# Patient Record
Sex: Female | Born: 1965 | Race: Black or African American | Hispanic: No | Smoking: Never smoker
Health system: Southern US, Community
[De-identification: ages and names within clinical notes are randomized; demographics above are authoritative.]

## PROBLEM LIST (undated history)

## (undated) DIAGNOSIS — M51369 Other intervertebral disc degeneration, lumbar region without mention of lumbar back pain or lower extremity pain: Secondary | ICD-10-CM

## (undated) DIAGNOSIS — M5126 Other intervertebral disc displacement, lumbar region: Secondary | ICD-10-CM

## (undated) DIAGNOSIS — I2510 Atherosclerotic heart disease of native coronary artery without angina pectoris: Secondary | ICD-10-CM

## (undated) DIAGNOSIS — R87619 Unspecified abnormal cytological findings in specimens from cervix uteri: Secondary | ICD-10-CM

## (undated) DIAGNOSIS — M1990 Unspecified osteoarthritis, unspecified site: Secondary | ICD-10-CM

## (undated) DIAGNOSIS — I10 Essential (primary) hypertension: Secondary | ICD-10-CM

## (undated) DIAGNOSIS — Z91148 Patient's other noncompliance with medication regimen for other reason: Secondary | ICD-10-CM

## (undated) DIAGNOSIS — E785 Hyperlipidemia, unspecified: Secondary | ICD-10-CM

## (undated) DIAGNOSIS — E282 Polycystic ovarian syndrome: Secondary | ICD-10-CM

## (undated) DIAGNOSIS — M5136 Other intervertebral disc degeneration, lumbar region: Secondary | ICD-10-CM

## (undated) DIAGNOSIS — Z9114 Patient's other noncompliance with medication regimen: Secondary | ICD-10-CM

## (undated) DIAGNOSIS — N200 Calculus of kidney: Secondary | ICD-10-CM

## (undated) DIAGNOSIS — E669 Obesity, unspecified: Secondary | ICD-10-CM

## (undated) DIAGNOSIS — D219 Benign neoplasm of connective and other soft tissue, unspecified: Secondary | ICD-10-CM

## (undated) DIAGNOSIS — D649 Anemia, unspecified: Secondary | ICD-10-CM

## (undated) DIAGNOSIS — G43909 Migraine, unspecified, not intractable, without status migrainosus: Secondary | ICD-10-CM

## (undated) HISTORY — DX: Benign neoplasm of connective and other soft tissue, unspecified: D21.9

## (undated) HISTORY — DX: Polycystic ovarian syndrome: E28.2

## (undated) HISTORY — DX: Atherosclerotic heart disease of native coronary artery without angina pectoris: I25.10

## (undated) HISTORY — DX: Migraine, unspecified, not intractable, without status migrainosus: G43.909

## (undated) HISTORY — DX: Calculus of kidney: N20.0

## (undated) HISTORY — DX: Patient's other noncompliance with medication regimen for other reason: Z91.148

## (undated) HISTORY — PX: BARIATRIC SURGERY: SHX1103

## (undated) HISTORY — DX: Unspecified abnormal cytological findings in specimens from cervix uteri: R87.619

## (undated) HISTORY — PX: CORONARY ANGIOPLASTY WITH STENT PLACEMENT: SHX49

## (undated) HISTORY — PX: TUBAL LIGATION: SHX77

## (undated) HISTORY — DX: Patient's other noncompliance with medication regimen: Z91.14

## (undated) HISTORY — DX: Obesity, unspecified: E66.9

## (undated) HISTORY — DX: Essential (primary) hypertension: I10

## (undated) HISTORY — DX: Anemia, unspecified: D64.9

## (undated) HISTORY — PX: OTHER SURGICAL HISTORY: SHX169

## (undated) HISTORY — DX: Hyperlipidemia, unspecified: E78.5

---

## 1999-09-18 ENCOUNTER — Emergency Department (HOSPITAL_COMMUNITY): Admission: EM | Admit: 1999-09-18 | Discharge: 1999-09-18 | Payer: Self-pay | Admitting: Emergency Medicine

## 1999-09-18 ENCOUNTER — Encounter: Payer: Self-pay | Admitting: Emergency Medicine

## 1999-09-24 ENCOUNTER — Encounter: Admission: RE | Admit: 1999-09-24 | Discharge: 1999-09-24 | Payer: Self-pay | Admitting: *Deleted

## 1999-09-25 ENCOUNTER — Encounter: Payer: Self-pay | Admitting: *Deleted

## 1999-09-25 ENCOUNTER — Encounter: Admission: RE | Admit: 1999-09-25 | Discharge: 1999-09-25 | Payer: Self-pay | Admitting: *Deleted

## 1999-12-09 ENCOUNTER — Emergency Department (HOSPITAL_COMMUNITY): Admission: EM | Admit: 1999-12-09 | Discharge: 1999-12-09 | Payer: Self-pay | Admitting: Emergency Medicine

## 1999-12-09 ENCOUNTER — Encounter: Payer: Self-pay | Admitting: *Deleted

## 2000-06-13 ENCOUNTER — Other Ambulatory Visit: Admission: RE | Admit: 2000-06-13 | Discharge: 2000-06-13 | Payer: Self-pay | Admitting: Obstetrics

## 2000-06-14 ENCOUNTER — Encounter (INDEPENDENT_AMBULATORY_CARE_PROVIDER_SITE_OTHER): Payer: Self-pay | Admitting: Specialist

## 2000-06-14 ENCOUNTER — Other Ambulatory Visit: Admission: RE | Admit: 2000-06-14 | Discharge: 2000-06-14 | Payer: Self-pay | Admitting: Obstetrics

## 2000-06-28 ENCOUNTER — Emergency Department (HOSPITAL_COMMUNITY): Admission: EM | Admit: 2000-06-28 | Discharge: 2000-06-28 | Payer: Self-pay | Admitting: Emergency Medicine

## 2000-09-15 ENCOUNTER — Emergency Department (HOSPITAL_COMMUNITY): Admission: EM | Admit: 2000-09-15 | Discharge: 2000-09-15 | Payer: Self-pay | Admitting: Emergency Medicine

## 2001-03-20 ENCOUNTER — Other Ambulatory Visit: Admission: RE | Admit: 2001-03-20 | Discharge: 2001-03-20 | Payer: Self-pay | Admitting: *Deleted

## 2001-09-03 ENCOUNTER — Emergency Department (HOSPITAL_COMMUNITY): Admission: EM | Admit: 2001-09-03 | Discharge: 2001-09-03 | Payer: Self-pay | Admitting: Emergency Medicine

## 2001-09-03 ENCOUNTER — Encounter: Payer: Self-pay | Admitting: Emergency Medicine

## 2003-05-04 ENCOUNTER — Emergency Department (HOSPITAL_COMMUNITY): Admission: AD | Admit: 2003-05-04 | Discharge: 2003-05-04 | Payer: Self-pay | Admitting: Emergency Medicine

## 2003-05-05 ENCOUNTER — Encounter: Payer: Self-pay | Admitting: Emergency Medicine

## 2003-05-08 ENCOUNTER — Emergency Department (HOSPITAL_COMMUNITY): Admission: EM | Admit: 2003-05-08 | Discharge: 2003-05-09 | Payer: Self-pay | Admitting: Emergency Medicine

## 2003-05-13 ENCOUNTER — Encounter: Admission: RE | Admit: 2003-05-13 | Discharge: 2003-05-13 | Payer: Self-pay | Admitting: Internal Medicine

## 2003-06-21 ENCOUNTER — Ambulatory Visit (HOSPITAL_COMMUNITY): Admission: RE | Admit: 2003-06-21 | Discharge: 2003-06-21 | Payer: Self-pay | Admitting: Internal Medicine

## 2003-06-21 ENCOUNTER — Encounter: Admission: RE | Admit: 2003-06-21 | Discharge: 2003-06-21 | Payer: Self-pay | Admitting: Internal Medicine

## 2003-07-23 ENCOUNTER — Ambulatory Visit (HOSPITAL_COMMUNITY): Admission: RE | Admit: 2003-07-23 | Discharge: 2003-07-23 | Payer: Self-pay | Admitting: Internal Medicine

## 2003-07-23 ENCOUNTER — Encounter (INDEPENDENT_AMBULATORY_CARE_PROVIDER_SITE_OTHER): Payer: Self-pay | Admitting: Cardiology

## 2003-08-06 ENCOUNTER — Encounter: Admission: RE | Admit: 2003-08-06 | Discharge: 2003-08-06 | Payer: Self-pay | Admitting: Internal Medicine

## 2004-02-10 ENCOUNTER — Emergency Department (HOSPITAL_COMMUNITY): Admission: EM | Admit: 2004-02-10 | Discharge: 2004-02-10 | Payer: Self-pay | Admitting: Family Medicine

## 2004-02-14 ENCOUNTER — Emergency Department (HOSPITAL_COMMUNITY): Admission: EM | Admit: 2004-02-14 | Discharge: 2004-02-14 | Payer: Self-pay | Admitting: Emergency Medicine

## 2004-02-28 ENCOUNTER — Emergency Department (HOSPITAL_COMMUNITY): Admission: EM | Admit: 2004-02-28 | Discharge: 2004-02-28 | Payer: Self-pay | Admitting: Emergency Medicine

## 2005-05-27 ENCOUNTER — Emergency Department (HOSPITAL_COMMUNITY): Admission: EM | Admit: 2005-05-27 | Discharge: 2005-05-28 | Payer: Self-pay | Admitting: Family Medicine

## 2005-05-30 ENCOUNTER — Emergency Department (HOSPITAL_COMMUNITY): Admission: EM | Admit: 2005-05-30 | Discharge: 2005-05-30 | Payer: Self-pay | Admitting: Family Medicine

## 2005-11-18 ENCOUNTER — Other Ambulatory Visit: Admission: RE | Admit: 2005-11-18 | Discharge: 2005-11-18 | Payer: Self-pay | Admitting: Obstetrics and Gynecology

## 2007-01-30 ENCOUNTER — Emergency Department (HOSPITAL_COMMUNITY): Admission: EM | Admit: 2007-01-30 | Discharge: 2007-01-30 | Payer: Self-pay | Admitting: Emergency Medicine

## 2007-07-13 HISTORY — PX: ABDOMINAL HYSTERECTOMY: SHX81

## 2007-07-13 LAB — HM PAP SMEAR: HM Pap smear: NORMAL

## 2009-07-31 ENCOUNTER — Emergency Department (HOSPITAL_COMMUNITY): Admission: EM | Admit: 2009-07-31 | Discharge: 2009-08-01 | Payer: Self-pay | Admitting: Emergency Medicine

## 2010-07-28 ENCOUNTER — Encounter: Payer: Self-pay | Admitting: Family Medicine

## 2010-07-28 ENCOUNTER — Ambulatory Visit
Admission: RE | Admit: 2010-07-28 | Discharge: 2010-07-28 | Payer: Self-pay | Source: Home / Self Care | Attending: Family Medicine | Admitting: Family Medicine

## 2010-07-28 DIAGNOSIS — R5383 Other fatigue: Secondary | ICD-10-CM

## 2010-07-28 DIAGNOSIS — R7303 Prediabetes: Secondary | ICD-10-CM | POA: Insufficient documentation

## 2010-07-28 DIAGNOSIS — Z91199 Patient's noncompliance with other medical treatment and regimen due to unspecified reason: Secondary | ICD-10-CM | POA: Insufficient documentation

## 2010-07-28 DIAGNOSIS — E785 Hyperlipidemia, unspecified: Secondary | ICD-10-CM | POA: Insufficient documentation

## 2010-07-28 DIAGNOSIS — R809 Proteinuria, unspecified: Secondary | ICD-10-CM | POA: Insufficient documentation

## 2010-07-28 DIAGNOSIS — I4949 Other premature depolarization: Secondary | ICD-10-CM | POA: Insufficient documentation

## 2010-07-28 DIAGNOSIS — Z9119 Patient's noncompliance with other medical treatment and regimen: Secondary | ICD-10-CM | POA: Insufficient documentation

## 2010-07-28 DIAGNOSIS — I499 Cardiac arrhythmia, unspecified: Secondary | ICD-10-CM | POA: Insufficient documentation

## 2010-07-28 DIAGNOSIS — R5381 Other malaise: Secondary | ICD-10-CM | POA: Insufficient documentation

## 2010-07-28 DIAGNOSIS — R079 Chest pain, unspecified: Secondary | ICD-10-CM | POA: Insufficient documentation

## 2010-07-28 DIAGNOSIS — I10 Essential (primary) hypertension: Secondary | ICD-10-CM | POA: Insufficient documentation

## 2010-07-28 LAB — CONVERTED CEMR LAB
Bilirubin Urine: NEGATIVE
Cholesterol: 318 mg/dL
Protein, U semiquant: 30
Urobilinogen, UA: 0.2
pH: 5.5

## 2010-07-29 ENCOUNTER — Telehealth (INDEPENDENT_AMBULATORY_CARE_PROVIDER_SITE_OTHER): Payer: Self-pay

## 2010-07-29 ENCOUNTER — Encounter: Payer: Self-pay | Admitting: Family Medicine

## 2010-07-29 ENCOUNTER — Telehealth: Payer: Self-pay | Admitting: Family Medicine

## 2010-07-29 LAB — CONVERTED CEMR LAB
AST: 23 units/L (ref 0–37)
Albumin: 4.3 g/dL (ref 3.5–5.2)
Alkaline Phosphatase: 63 units/L (ref 39–117)
BUN: 10 mg/dL (ref 6–23)
Chloride: 107 meq/L (ref 96–112)
Eosinophils Relative: 1 % (ref 0–5)
Glucose, Bld: 96 mg/dL (ref 70–99)
Lymphocytes Relative: 39 % (ref 12–46)
MCHC: 33.4 g/dL (ref 30.0–36.0)
MCV: 85.8 fL (ref 78.0–100.0)
Monocytes Absolute: 0.6 10*3/uL (ref 0.1–1.0)
RBC: 4.64 M/uL (ref 3.87–5.11)
RDW: 13.9 % (ref 11.5–15.5)
TSH: 2.183 microintl units/mL (ref 0.350–4.500)
Total Protein: 7 g/dL (ref 6.0–8.3)
Vit D, 25-Hydroxy: 10 ng/mL — ABNORMAL LOW (ref 30–89)

## 2010-07-31 ENCOUNTER — Encounter (INDEPENDENT_AMBULATORY_CARE_PROVIDER_SITE_OTHER): Payer: Self-pay

## 2010-07-31 ENCOUNTER — Encounter: Payer: Self-pay | Admitting: Family Medicine

## 2010-08-03 ENCOUNTER — Ambulatory Visit
Admission: RE | Admit: 2010-08-03 | Discharge: 2010-08-03 | Payer: Self-pay | Source: Home / Self Care | Attending: Cardiology | Admitting: Cardiology

## 2010-08-03 ENCOUNTER — Encounter: Payer: Self-pay | Admitting: Cardiology

## 2010-08-03 DIAGNOSIS — R0602 Shortness of breath: Secondary | ICD-10-CM | POA: Insufficient documentation

## 2010-08-04 ENCOUNTER — Telehealth (INDEPENDENT_AMBULATORY_CARE_PROVIDER_SITE_OTHER): Payer: Self-pay | Admitting: *Deleted

## 2010-08-05 ENCOUNTER — Telehealth (INDEPENDENT_AMBULATORY_CARE_PROVIDER_SITE_OTHER): Payer: Self-pay | Admitting: *Deleted

## 2010-08-05 ENCOUNTER — Ambulatory Visit: Admit: 2010-08-05 | Payer: Self-pay | Admitting: Family Medicine

## 2010-08-06 ENCOUNTER — Encounter: Payer: Self-pay | Admitting: *Deleted

## 2010-08-06 ENCOUNTER — Ambulatory Visit: Admission: RE | Admit: 2010-08-06 | Discharge: 2010-08-06 | Payer: Self-pay | Source: Home / Self Care

## 2010-08-06 ENCOUNTER — Encounter (HOSPITAL_COMMUNITY)
Admission: RE | Admit: 2010-08-06 | Discharge: 2010-08-11 | Payer: Self-pay | Source: Home / Self Care | Attending: Cardiology | Admitting: Cardiology

## 2010-08-10 ENCOUNTER — Ambulatory Visit: Admission: RE | Admit: 2010-08-10 | Discharge: 2010-08-10 | Payer: Self-pay | Source: Home / Self Care

## 2010-08-10 ENCOUNTER — Encounter: Payer: Self-pay | Admitting: Cardiology

## 2010-08-10 ENCOUNTER — Ambulatory Visit
Admission: RE | Admit: 2010-08-10 | Discharge: 2010-08-10 | Payer: Self-pay | Source: Home / Self Care | Attending: Family Medicine | Admitting: Family Medicine

## 2010-08-11 ENCOUNTER — Other Ambulatory Visit: Payer: Self-pay | Admitting: Cardiology

## 2010-08-11 ENCOUNTER — Ambulatory Visit: Admission: RE | Admit: 2010-08-11 | Discharge: 2010-08-11 | Payer: Self-pay | Source: Home / Self Care

## 2010-08-13 NOTE — Progress Notes (Signed)
  Phone Note Call from Patient   Caller: Patient Summary of Call: The patient is returning your call. [phone] Initial call taken by: Rosine Beat,  July 29, 2010 12:03 PM  Follow-up for Phone Call        Pt. was contacted. Follow-up by: Levonne Spiller EMT-P,  July 29, 2010 12:21 PM

## 2010-08-13 NOTE — Progress Notes (Signed)
Summary: Nuclear Pre-Procedure  Phone Note Outgoing Call Call back at [facility] Phone [phone]   Call placed by: Stanton Kidney, EMT-P,  August 05, 2010 3:45 PM Call placed to: Patient Action Taken: Phone Call Completed Summary of Call: Reviewed information on Myoview Information Sheet (see scanned document for further details).  Spoke with the patient. Stanton Kidney, EMT-P  August 05, 2010 3:45 PM      Nuclear Med Background Indications for Stress Test: Evaluation for Ischemia   History: Echo  History Comments: '05 Echo: EF=55-65%  Symptoms: Chest Pain, Chest Tightness, DOE, Fatigue, SOB    Nuclear Pre-Procedure Cardiac Risk Factors: Family History - CAD, Hypertension, Lipids Height (in): 67

## 2010-08-13 NOTE — Assessment & Plan Note (Signed)
Summary: NP6/AMD   Visit Type:  Initial Consult Primary Provider:  Dr. Standley Dakins  CC:  c/o chest pain and occasional SOB last week.Marland Kitchen  History of Present Illness: 45 yo with history of HTN and hyperlipidemia presents for evaluation of chest pain.  She has been fatigued for the last 2 months.   Last week, patient felt "bad" in general.  Her BP was uncontrolled, reaching up to 220/117 when she checked it at work.  She had not been taking any antihypertensives (was trying to manage her BP with herbals).  Last Monday, she was walking around a store shopping when she developed a very severe episode of chest tightness radiating to her left arm.  She went home and laid down, and the pain eventually stopped. It lasted about 1.5 hours total.  Since that time, she has had several more episodes of chest pain.  These have not lasted as long or been as severe.  They seem to be related to stress rather than exertion.  They will last for a few minutes then resolve.  She does have exertional dyspnea: short of breath with steps or with long distances walking.  She saw Dr. Laural Benes recently and was started on antihypertensives.  BP is now much better (138/78 today).    ECG: NSR, borderline LAE, PVCs  Labs (1/12): LDL 49, HDL 57, TSH normal, K 4.3, creatinine 1.3  Current Medications (verified): 1)  Vicodin 5-500 Mg Tabs (Hydrocodone-Acetaminophen) .... As Needed 2)  Ultram 50 Mg Tabs (Tramadol Hcl) .... As Needed 3)  Losartan Potassium-Hctz 50-12.5 Mg Tabs (Losartan Potassium-Hctz) .... Take 1 By Mouth Daily For Blood Pressure 4)  Metoprolol Succinate 25 Mg Xr24h-Tab (Metoprolol Succinate) .... Take 1 By Mouth Daily For Blood Pressure 5)  Lipitor 40 Mg Tabs (Atorvastatin Calcium) .... Take 1 By Mouth At Bedtime For Cholesterol  Allergies (verified): 1)  ! Lisinopril  Past History:  Past Surgical History: Last updated: 07/28/2010 BTL - 1992 Hysterectomy - 2009  Family History: Last updated:  08/03/2010 HTN - multiple family members Hyperlipidemia Father with MI in his early 69s, had CABG in his 51s Multiple first cousins with MIs in their 61s.   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 HTN 2. Nephrolithiasis 3. Poor Medical Compliance 4. Hyperlipidemia 5. Obese 6. Chest pain: Prior episode in 2004.  She says she had a stress test that was normal.   Family History: HTN - multiple family members Hyperlipidemia Father with MI in his early 65s, had CABG in his 55s Multiple first cousins with MIs in their 16s.   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   Vital Signs:  Patient profile:   45 year old female Height:      67 inches Weight:      322.75 pounds BMI:     50.73 Pulse rate:   73 / minute BP sitting:   138 / 78  (left arm) Cuff size:   large  Vitals Entered By: Lysbeth Galas CMA (August 03, 2010 4:40 PM)  Physical Exam  General:  Well  developed, well nourished, in no acute distress. Obese.  Head:  normocephalic and atraumatic Nose:  no deformity, discharge, inflammation, or lesions Mouth:  Teeth, gums and palate normal. Oral mucosa normal. Neck:  Neck thick, no JVD. No masses, thyromegaly or abnormal cervical nodes. Lungs:  Clear bilaterally to auscultation and percussion. Heart:  Non-displaced PMI, chest non-tender; regular rate and rhythm, S1, S2 without rubs or gallops. 1/6 SEM RUSB.  Carotid upstroke normal, no bruit.  Pedals normal pulses. No edema, no varicosities. Abdomen:  Bowel sounds positive; abdomen  soft and non-tender without masses, organomegaly, or hernias noted. No hepatosplenomegaly. Extremities:  No clubbing or cyanosis. Neurologic:  Alert and oriented x 3. Skin:  Intact without lesions or rashes. Psych:  Normal affect.   Impression & Recommendations:  Problem # 1:  CHEST PAIN UNSPECIFIED (ICD-786.50) Patient had a prolonged episode of chest pain while walking around a store last week.  She has had several shorter episodes since then that were not clearly exertional.  Her BP was quite high last week when this was going on.  She has a number of risk factors for CAD: HTN, hyperlipidemia, and a strong family history of premature CAD (in their 52s).  I will have her start ASA 81 mg daily.  Given her family history, I am quite worried that this could represent myocardial ischemia.  I will arrange for an ETT-myoview to assess.    Problem # 2:  SHORTNESS OF BREATH (ICD-786.05) Patient has exertional dyspnea, history of HTN,  and a mild systolic murmur.  Dyspnea may be due to obesity and deconditioning.  I will get an echo to assess LV systolic and diastolic function, look for significan LVH.   Problem # 3:  HYPERTENSION, UNSPECIFIED (ICD-401.9) BP better today on losartan/HCTZ and Toprol XL.  Continue these medications.    Problem # 4:  DYSLIPIDEMIA (ICD-272.4) Excellent lipids on atorvastatin.  Continue.   Other Orders: EKG w/ Interpretation (93000) Echocardiogram (Echo) Nuclear Stress Test (Nuc Stress Test)  Patient Instructions: 1)  Your physician recommends that you schedule a follow-up appointment in: 2 weeks 2)  Your physician has requested that you have an echocardiogram.  Echocardiography is a painless test that uses sound waves to create images of your heart. It provides your doctor with information about the size and shape of your heart and how well your heart's chambers and valves are working.  This procedure takes approximately one hour. There are no restrictions for this  procedure. 3)  Your physician has requested that you have an exercise stress myoview.  We will call you with this appt tomorrow. Instruction sheet was given to you today.

## 2010-08-13 NOTE — Letter (Signed)
Summary: Results Follow-up Letter  [facility]  [address]   [place], [place] [address]   Phone: [phone]  Fax: [phone]    07/31/2010  [address] [place], [place]  [address]  Dear Jacqueline Ferrell,   The following are the results of your recent test(s):  Test     Result     Pap Smear    Normal_______  Not Normal_____       Comments: _________________________________________________________ Cholesterol LDL(Bad cholesterol):          Your goal is less than:         HDL (Good cholesterol):        Your goal is more than: _________________________________________________________ Other Tests:  Urine Culture negative _________________________________________________________  Please call for an appointment Or _________________________________________________________ _________________________________________________________ _________________________________________________________  Sincerely,  Levonne Spiller EMT-P [facility]

## 2010-08-13 NOTE — Assessment & Plan Note (Signed)
Summary: ELEVATED BP/JBB   Vital Signs:  Patient Profile:   45 Years Old Female CC:      HTN, Headache Height:     67 inches Weight:      325 pounds BMI:     51.09 O2 Sat:      100 % O2 treatment:    Room Air Temp:     98.1 degrees F oral Pulse rate:   83 / minute Pulse rhythm:   regular Resp:     18 per minute BP sitting:   168 / 108  (left arm)  Pt. in pain?   yes    Location:   head    Intensity:   3    Type:       aching  Vitals Entered By: Levonne Spiller EMT-P (July 28, 2010 4:43 PM)              Is Patient Diabetic? No  Does patient need assistance? Functional Status Self care Ambulation Normal      Current Allergies: ! LISINOPRILHistory of Present Illness History from: patient Chief Complaint: HTN, Headache History of Present Illness: The patient presented today because she has been concerned about symptoms of headaches and blurry vision and fatigue that have been present and getting worse since she stopped taking her BP medications about a year ago. She says that she wanted to pursue taking "natural alternatives" to control her BP medications and stopped taking Benicar.  She said that she has taken lisinopril in the past and it has caused swelling in the lips.  The patient says that she had CP yesterday while shopping with a friend at the store.  She says that she had left arm pain and CP that lasted for about 1 to 1.5 hours.  She says that she has not been experiencing this on a regular basis. She says that she checked her BP at work this morning and her BP was 202/117.  She says that she went home and layed down.     Serial Vital Signs/Assessments:  Time      Position  BP       Pulse  Resp  Temp     By 6:00 P              142/90   82                    Clanford Johnson MD REVIEW OF SYSTEMS Constitutional Symptoms       Complains of fatigue.     Denies fever, chills, night sweats, weight loss, and weight gain.  Eyes       Complains of change in vision.       Denies eye pain, eye discharge, glasses, contact lenses, and eye surgery. Ear/Nose/Throat/Mouth       Complains of dizziness.      Denies hearing loss/aids, change in hearing, ear pain, ear discharge, frequent runny nose, frequent nose bleeds, sinus problems, sore throat, hoarseness, and tooth pain or bleeding.  Respiratory       Denies dry cough, productive cough, wheezing, shortness of breath, asthma, bronchitis, and emphysema/COPD.  Cardiovascular       Denies murmurs, chest pain, and tires easily with exhertion.    Gastrointestinal       Denies stomach pain, nausea/vomiting, diarrhea, constipation, blood in bowel movements, and indigestion. Genitourniary       Denies painful urination, blood or discharge from vagina, kidney stones, and loss of urinary control.  Neurological       Complains of headaches and tingling.      Denies paralysis, seizures, and fainting/blackouts.      Comments: Tingling in her face Musculoskeletal       Denies muscle pain, joint pain, joint stiffness, decreased range of motion, redness, swelling, muscle weakness, and gout.  Skin       Denies bruising, unusual mles/lumps or sores, and hair/skin or nail changes.  Psych       Complains of anxiety/stress.      Denies mood changes, temper/anger issues, speech problems, depression, and sleep problems.  Past History:  Past Medical History: HTN Nephrolithiasis Poor Medical Compliance  Past Surgical History: BTL - 1992 Hysterectomy - 2009  Family History: HTN - Strong Hyperlipidemia  Social History: Married Never Smoked Alcohol use-no Drug use-no Regular exercise-no  Smoking Status:  never Drug Use:  no Does Patient Exercise:  no Physical Exam General appearance: well developed, well nourished, no acute distress Head: normocephalic, atraumatic Eyes: conjunctivae and lids normal Pupils: equal, round, reactive to light Ears: normal, no lesions or deformities Nasal: mucosa pink, nonedematous, no  septal deviation, turbinates normal Oral/Pharynx: tongue normal, posterior pharynx without erythema or exudate Neck: neck supple,  trachea midline, no masses Thyroid: no nodules, masses, tenderness; mild  enlargement noted Chest/Lungs: no rales, wheezes, or rhonchi bilateral, breath sounds equal without effort Heart: normal rate but irregular rhythm, normal s1 and s2 sounds Abdomen: soft, non-tender without obvious organomegaly Extremities: bilateral pitting ankle edema and trace pretibial edema bilateral Neurological: grossly intact and non-focal Skin: no obvious rashes or lesions MSE: oriented to time, place, and person Assessment New Problems: PREMATURE VENTRICULAR CONTRACTIONS, 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 The risks, benefits and possible side effects were clearly explained and discussed with the patient.  The patient verbalized clear understanding.  The patient was given instructions to return if symptoms don't improve, worsen or new changes develop.  If it is not during clinic hours and the patient cannot get back to this clinic then the patient was told to seek medical care at an available urgent care or emergency department.  The patient verbalized understanding.   Demonstrates willingness to comply.  Plan New Medications/Changes: LIPITOR 40 MG TABS (ATORVASTATIN CALCIUM) take 1 by mouth at bedtime for cholesterol  #30 x 1, 07/28/2010, Clanford Johnson MD METOPROLOL SUCCINATE 25 MG XR24H-TAB (METOPROLOL SUCCINATE) take 1 by mouth daily for blood pressure  #30 x 1, 07/28/2010, Clanford  Johnson MD LOSARTAN POTASSIUM-HCTZ 50-12.5 MG TABS (LOSARTAN POTASSIUM-HCTZ) take 1 by mouth daily for blood pressure  #30 x 1, 07/28/2010, Standley Dakins MD  Planning Comments:   Cardiology Referral for stress testing and ECHO Culture Urine Proteinuria discussed with patient Compliance and follow up discussed with patient.  1 week follow up discussed with patient The patient verbalized clear understanding.   Also, patient was clearly told that if she developed any swelling of lips or tongue or anywhere on body to stop the losartan medication. The patient verbalized clear understanding.   Urine Culture Ordered. Pt will start aspirin 81 mg by mouth daily until she is seen by cardiology Pt will start lipitor therapy as well.  The patient was encouraged to establish a primary care provider.  Follow Up: 1 week  The patient and/or caregiver has been counseled thoroughly with regard to medications prescribed including dosage, schedule, interactions, rationale for use, and possible side effects and they verbalize understanding.  Diagnoses and expected course of recovery discussed and will return if not improved as expected or if the condition worsens. Patient and/or caregiver verbalized understanding.  Prescriptions: LIPITOR 40 MG TABS (ATORVASTATIN CALCIUM) take 1 by mouth at bedtime for cholesterol  #30 x 1   Entered and Authorized by:   Standley Dakins MD   Signed by:   Standley Dakins MD on 07/28/2010   Method used:   Electronically to        [facility] [phone]* (retail)       [address] [place], [place]  [address]       Ph: [phone] or [phone]       Fax: [phone]   RxID:   [id number] METOPROLOL SUCCINATE 25 MG XR24H-TAB (METOPROLOL SUCCINATE) take 1 by mouth daily for blood pressure  #30 x 1   Entered and Authorized by:   Standley Dakins MD   Signed by:   Standley Dakins MD on 07/28/2010   Method used:   Electronically to         [facility] [phone]* (retail)       [address] [place], [place]  [address]       Ph: [phone] or [phone]       Fax: [phone]   RxID:   [id number] LOSARTAN POTASSIUM-HCTZ 50-12.5 MG TABS (LOSARTAN POTASSIUM-HCTZ) take 1 by mouth daily for blood pressure  #30 x 1   Entered and Authorized by:   Standley Dakins MD   Signed by:   Standley Dakins MD on 07/28/2010   Method used:   Electronically to        [facility] [phone]* (retail)       [address] [place], [place]  [address]       Ph: [phone] or [phone]       Fax: [phone]   RxID:   [id number]   Patient Instructions: 1)  Go to the pharmacy and pick up your prescription (s).  It may take up to 30 mins for electronic prescriptions to be delivered to the pharmacy.  Please call if your pharmacy has not received your prescriptions after 30 minutes.   2)  Check your Blood Pressure regularly. If it is above: 140/90 you should make an appointment. 3)  Please schedule a follow-up appointment in 1 week to have your blood pressure rechecked. 4)  The patient was informed that there is no on-call provider or services available at this clinic during off-hours (when the clinic is closed).  If the patient developed a problem or concern that required immediate attention, the patient was advised to go the the nearest available urgent care or emergency department for medical care.  The patient verbalized understanding.    5)  Return or go to the ER if no improvement or symptoms getting worse.   6)  Your physician has requested that you have the following labwork done today: CBC, CMP, Lipids, UA, Vit D 7)  We should be calling you with your lab results.  If you haven't heard anything in 3 days please call the clinic at [phone].  8)  Please stop taking Losartan if you develop swelling in lips or tongue or throat or any other adverse effects from the medications.      Lab Results    Ordered by:  Dr. Maryln Manuel    Date tests performed: 07/28/2010    Performed by:  Levonne Spiller EMT-P Urinalysis:      Color:     Yellow    Appear:     Clear    Leuk:     Neg    Nitr:     Neg    Urobil:     0.2    Prot:     30 (1+)    pH:     5.5    Blood:     Trace    Sp. Gr:     1.030    Ket:     Neg    Bili:     Neg    Glu:     Neg  Lipids      Chol:     318   mg/dL    Trig:     81   mg/dL    HDL:     28   mg/dL    LDL:     454   mg/dL

## 2010-08-13 NOTE — Progress Notes (Signed)
Summary: Called pt  Phone Note Outgoing Call Call back at [facility] Phone [phone]   Call placed by: Harlon Flor,  August 04, 2010 11:52 AM Call placed to: Patient Summary of Call: LMOM TCB to schedule ECHO. Initial call taken by: Harlon Flor,  August 04, 2010 11:52 AM

## 2010-08-13 NOTE — Letter (Signed)
Summary: letter   letter   Imported By: Dorna Leitz 08/01/2010 13:57:58  _____________________________________________________________________  External Attachment:    Type:   Image     Comment:   External Document

## 2010-08-13 NOTE — Progress Notes (Signed)
Summary: Cardiology Referral Made  ---- Converted from flag ---- ---- 07/29/2010 8:58 AM, Levonne Spiller EMT-P wrote: Pt. has an appt. with [facility]. Dr. Shirlee Latch, 08-03-10 @ 4:00pm  ---- 07/28/2010 5:14 PM, Standley Dakins MD wrote: Refer to cardiology for CP and irregular heart rhythm, uncontrolled HTN.  Thanks. ------------------------------

## 2010-08-19 NOTE — Assessment & Plan Note (Signed)
Summary: Cardiology Nuclear Testing  Nuclear Med Background Indications for Stress Test: Evaluation for Ischemia   History: Echo, GXT  History Comments: '04 GXT:OK per patient; '05 Echo: EF=55-65%  Symptoms: Chest Pain, Chest Pain with Exertion, Chest Tightness, Chest Tightness with Exertion, Dizziness, DOE, Fatigue, Palpitations, Rapid HR, SOB  Symptoms Comments: Last episode of XB:[id number].   Nuclear Pre-Procedure Cardiac Risk Factors: Family History - CAD, Hypertension, Lipids, Obesity Caffeine/Decaff Intake: None NPO After: 7:00 PM Lungs: Clear. IV 0.9% NS with Angio Cath: 24g     IV Site: L Wrist IV Started by: Irean Hong, RN Chest Size (in) 48     Cup Size C     Height (in): 67 Weight (lb): 323 BMI: 50.77 Tech Comments: Metoprolol held x 24 hours.  Nuclear Med Study 1 or 2 day study:  2 day     Stress Test Type:  Treadmill/Lexiscan Reading MD:  Cassell Clement, MD     Referring MD:  Marca Ancona, MD Resting Radionuclide:  Technetium 44m Tetrofosmin     Resting Radionuclide Dose:  33 mCi  Stress Radionuclide:  Technetium 34m Tetrofosmin     Stress Radionuclide Dose:  33 mCi   Stress Protocol Exercise Time (min):  8:00 min     Max HR:  130 bpm     Predicted Max HR:  177 bpm  Max Systolic BP: 170 mm Hg     Percent Max HR:  73.45 %     METS: 7.2 Rate Pressure Product:  13086  Lexiscan: 0.4 mg   Stress Test Technologist:  Rea College, CMA-N     Nuclear Technologist:  Doyne Keel, CNMT  Rest Procedure  Myocardial perfusion imaging was performed at rest 45 minutes following the intravenous administration of Technetium 10m Tetrofosmin.  Stress Procedure  The patient attempted to walk the treadmill utilizing the Bruce protocol for 6:34, but was unable to get her heart rate up.  She was then given IV Lexiscan 0.4 mg over 15-seconds with continued low level exercise and then Technetium 30m Tetrofosmin was injected at 30-seconds.  There were no diagnostic ST-T wave  changes with infusion.  There were frequent PVC's.  Quantitative spect images were obtained after a 45 minute delay.  QPS Raw Data Images:  There is interference from nuclear activity from structures below the diaphragm.  This does not affect the ability to read the study. Stress Images:  There is decreased uptake in the anterior wall. Rest Images:  Normal homogeneous uptake in all areas of the myocardium. Subtraction (SDS):  Small area of possible reversible ischemia in region of diagonal. Cannot exclude breast attenuation. Transient Ischemic Dilatation:  .92  (Normal <1.22)  Lung/Heart Ratio:  .28  (Normal <0.45)  Quantitative Gated Spect Images QGS EDV:  79 ml QGS ESV:  38 ml QGS EF:  52 % QGS cine images:  No wall motion abnormalities.  Findings Low risk nuclear study Evidence for anterior (septal apical) ischemia      Overall Impression  Exercise Capacity: Inadequate heart rate response to exercise so converted to Lexiscan. BP Response: Normal blood pressure response. Clinical Symptoms: No chest pain ECG Impression: Nonspecific ST changes. Overall Impression: Low risk stress nuclear study. Overall Impression Comments: Small reversible perfusion defect in region of diagonal.  No associated wall motion abnormalities. Cannot exclude breast attenuation.  Appended Document: Cardiology Nuclear Testing Small area of possible diagonal-area ischemia.  However, could be attenuation.  Needs to be seen back in followup.   Appended Document: Cardiology Nuclear  Testing Attempted to call pt, LMOM TCB /MES  Appended Document: Cardiology Nuclear Testing Spoke to pt, she is scheduled to see Dr. Shirlee Latch 08/31/10 [place]

## 2010-08-19 NOTE — Assessment & Plan Note (Signed)
Summary: recheck of bp and other problems/jbb   Vital Signs:  Patient profile:   45 year old female Height:      67 inches Weight:      322.75 pounds BMI:     50.73 Temp:     98.6 degrees F (37.00 degrees C) oral Resp:     14 per minute BP sitting:   129 / 84  (left arm)  Vitals Entered By: Standley Dakins MD (August 10, 2010 3:42 PM)  History of Present Illness: The patient is presenting today for a followup evaluation from her initial office visit her she had a very high blood pressure and high cholesterol. The patient reports that she has been taking her medications and she is tolerating them well. She reports that her blood pressures have been improving considerably since starting the medication. She has seen the cardiologist and she has undergone stress testing and is scheduled to have an echocardiogram done later this week. The patient reports that she is still having chest pain episodes but they have not been as severe. The patient reports that she is monitoring her blood pressure at home and her systolic readings are no higher than 140 and her diastolic readings are less than 90. The patient reports that overall her readings have improved considerably. The patient says she's had occasional shortness of breath and occasional palpitations but overall these episodes have been rare. The patient reports that she has experienced some acid reflux after taking the cholesterol medications. She says that it is not severe enough to stop the medication.  Allergies: 1)  ! Lisinopril  Past History:  Past Medical History: Reviewed history from 08/03/2010 and no changes required. 1. HTN 2. Nephrolithiasis 3. Poor Medical Compliance 4. Hyperlipidemia 5. Obese 6. Chest pain: Prior episode in 2004.  She says she had a stress test that was normal.   Past Surgical History: Reviewed history from 07/28/2010 and no changes required. BTL - 1992 Hysterectomy - 2009  Family History: Reviewed  history from 08/03/2010 and no changes required. HTN - multiple family members Hyperlipidemia Father with MI in his early 25s, had CABG in his 52s Multiple first cousins with MIs in their 81s.   No siblings  Social History: Reviewed history from 08/03/2010 and no changes required. 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 Eyes:  Denies blurring, discharge, double vision, eye irritation, eye pain, halos, itching, light sensitivity, red eye, vision loss-1 eye, and vision loss-both eyes. ENT:  Denies decreased hearing, difficulty swallowing, ear discharge, earache, hoarseness, nasal congestion, nosebleeds, postnasal drainage, ringing in ears, sinus pressure, and sore throat. CV:  Complains of chest pain or discomfort, palpitations, and shortness of breath with exertion; denies bluish discoloration of lips or nails, difficulty breathing at night, difficulty breathing while lying down, fainting, fatigue, leg cramps with exertion, lightheadness, near fainting, swelling of feet, swelling of hands, and weight gain. Resp:  Complains of chest discomfort; denies chest pain with inspiration, cough, coughing up blood, excessive snoring, hypersomnolence, morning headaches, pleuritic, shortness of breath, sputum productive, and wheezing. GI:  Complains of indigestion; denies abdominal pain, bloody stools, change in bowel habits, constipation, dark tarry stools, diarrhea, excessive appetite, gas, hemorrhoids, loss of appetite, nausea, vomiting, vomiting blood, and yellowish skin color. GU:  Denies abnormal vaginal bleeding, decreased libido, discharge,  dysuria, genital sores, hematuria, incontinence, nocturia, urinary frequency, and urinary hesitancy. MS:  Denies joint pain, joint redness, joint  swelling, loss of strength, low back pain, mid back pain, muscle aches, muscle , cramps, muscle weakness, stiffness, and thoracic pain. Neuro:  Denies brief paralysis, difficulty with concentration, disturbances in coordination, falling down, headaches, inability to speak, memory loss, numbness, poor balance, seizures, sensation of room spinning, tingling, tremors, visual disturbances, and weakness. Psych:  Denies alternate hallucination ( auditory/visual), anxiety, depression, easily angered, easily tearful, irritability, mental problems, panic attacks, sense of great danger, suicidal thoughts/plans, thoughts of violence, unusual visions or sounds, and thoughts /plans of harming others.  Physical Exam  General:  Well developed, well nourished, in no acute distress. Obese.  Head:  normocephalic and atraumatic Eyes:  No corneal or conjunctival inflammation noted. EOMI. Perrla. Funduscopic exam benign, without hemorrhages, exudates or papilledema. Vision grossly normal. Ears:  External ear exam shows no significant lesions or deformities.  Otoscopic examination reveals clear canals, tympanic membranes are intact bilaterally without bulging, retraction, inflammation or discharge. Hearing is grossly normal bilaterally. Nose:  External nasal examination shows no deformity or inflammation. Nasal mucosa are pink and moist without lesions or exudates. Mouth:  Teeth, gums and palate normal. Oral mucosa normal. Neck:  No deformities, masses, or tenderness noted.no JVD.   Lungs:  Clear bilaterally to auscultation and percussion. Heart:  Non-displaced PMI, chest non-tender; regular rate and rhythm, S1, S2 without rubs or gallops. 1/6 SEM RUSB.  Carotid upstroke normal, no bruit.  Pedals normal pulses. No edema, no varicosities. Abdomen:  Bowel sounds positive; abdomen soft and non-tender without masses, organomegaly, or hernias noted. No hepatosplenomegaly. Pulses:  R and L carotid,radial,femoral,dorsalis pedis  and posterior tibial pulses are full and equal bilaterally Extremities:  No clubbing or cyanosis. Neurologic:  Alert and oriented x 3. Psych:  Normal affect.   Impression & Recommendations:  Problem # 1:  METABOLIC SYNDROME X (ICD-277.7) We'll continue to work with the patient closely to control her weight, lipids, blood pressure and monitor for glucose intolerance. The patient is at high risk for type 2 diabetes mellitus.  Problem # 2:  HYPERTENSION, UNSPECIFIED (ICD-401.9)  Her updated medication list for this problem includes:    Losartan Potassium-hctz 50-12.5 Mg Tabs (Losartan potassium-hctz) .Marland Kitchen... Take 1 by mouth daily for blood pressure    Metoprolol Succinate 25 Mg Xr24h-tab (Metoprolol succinate) .Marland Kitchen... Take 1 by mouth daily for blood pressure  Problem # 3:  PREMATURE VENTRICULAR CONTRACTIONS, FREQUENT (ICD-427.69)  Her updated medication list for this problem includes:    Metoprolol Succinate 25 Mg Xr24h-tab (Metoprolol succinate) .Marland Kitchen... Take 1 by mouth daily for blood pressure    Aspirin 81 Mg Tbec (Aspirin) .Marland Kitchen... Take one tablet by mouth daily  Problem # 4:  DYSLIPIDEMIA (ICD-272.4)  Her updated medication list for this problem includes:    Lipitor 40 Mg Tabs (Atorvastatin calcium) .Marland Kitchen... Take 1 by mouth at bedtime for cholesterol  Problem # 5:  CHEST PAIN UNSPECIFIED (ICD-786.50) The patient is scheduled for a stress test and echocardiogram this week. She will continue to followup with the cardiology team at [facility].  Complete Medication List: 1)  Vicodin 5-500 Mg Tabs (Hydrocodone-acetaminophen) .... As needed 2)  Ultram 50 Mg Tabs (Tramadol hcl) .... As needed 3)  Losartan Potassium-hctz 50-12.5 Mg Tabs (Losartan potassium-hctz) .... Take 1 by mouth daily for blood pressure 4)  Metoprolol Succinate 25 Mg Xr24h-tab (Metoprolol succinate) .... Take 1 by mouth daily for blood pressure  5)  Lipitor 40 Mg Tabs (Atorvastatin calcium) .... Take 1 by mouth at bedtime  for cholesterol 6)  Aspirin 81 Mg Tbec (Aspirin) .... Take one tablet by mouth daily  Patient Instructions: 1)  Limit your Sodium (Salt). 2)  Limit your Sodium (Salt) to less than 2 grams a day(slightly less than 1/2 a teaspoon) to prevent fluid retention, swelling, or worsening of symptoms. 3)  It is important that you exercise regularly at least 20 minutes 5 times a week. If you develop chest pain, have severe difficulty breathing, or feel very tired , stop exercising immediately and seek medical attention. 4)  You need to lose weight. Consider a lower calorie diet and regular exercise.  5)  Make sure you followup with her cardiologist and have the test that they recommend that you have done. 6)  Please followup at the end of March of this year so that we can repeat her blood work including your lipids, metabolic panel, urinalysis and blood sugar. 7)  The patient was informed that there is no on-call provider or services available at this clinic during off-hours (when the clinic is closed).  If the patient developed a problem or concern that required immediate attention, the patient was advised to go the the nearest available urgent care or emergency department for medical care.  The patient verbalized understanding.    8)  Return or go to the ER if no improvement or symptoms getting worse.

## 2010-08-31 ENCOUNTER — Ambulatory Visit (INDEPENDENT_AMBULATORY_CARE_PROVIDER_SITE_OTHER): Payer: PRIVATE HEALTH INSURANCE | Admitting: Cardiology

## 2010-08-31 ENCOUNTER — Encounter: Payer: Self-pay | Admitting: Cardiology

## 2010-08-31 DIAGNOSIS — R9430 Abnormal result of cardiovascular function study, unspecified: Secondary | ICD-10-CM | POA: Insufficient documentation

## 2010-08-31 DIAGNOSIS — R079 Chest pain, unspecified: Secondary | ICD-10-CM

## 2010-09-02 ENCOUNTER — Encounter: Payer: Self-pay | Admitting: Cardiology

## 2010-09-04 ENCOUNTER — Other Ambulatory Visit: Payer: PRIVATE HEALTH INSURANCE

## 2010-09-08 NOTE — Assessment & Plan Note (Signed)
Summary: go over stress test results per pt call/this is a [place] ...   Primary Provider:  Dr. Standley Dakins   History of Present Illness: 45 yo with history of HTN and hyperlipidemia returns for evaluation of chest pain.  She has been having episodes of stabbing central chest pain with exertion.  She has noticed this with grocery shopping, walking up steps, and with other moderate exertion.  It was occurring several times a week but not daily. There really does not seem to have been any chest pain at rest.  This has gone on for over 2 months.  She took the last week off work and says that she stayed in bed most of the week.  She had no chest pain that week. She gets exertional dyspnea with steps. BP is now under better control.    Echo was done showing EF 50-55%, borderline systolic function.  Myoview showed EF 52% with a small area of diagonal area ischemia versus shifting breast attenuation.   Labs (1/12): LDL 49, HDL 57, TSH normal, K 4.3, creatinine 1.3  Current Medications (verified): 1)  Vicodin 5-500 Mg Tabs (Hydrocodone-Acetaminophen) .... As Needed 2)  Ultram 50 Mg Tabs (Tramadol Hcl) .... As Needed 3)  Losartan Potassium-Hctz 50-12.5 Mg Tabs (Losartan Potassium-Hctz) .... Take 1 By Mouth Daily For Blood Pressure 4)  Metoprolol Succinate 25 Mg Xr24h-Tab (Metoprolol Succinate) .... Take 1 By Mouth Daily For Blood Pressure 5)  Lipitor 40 Mg Tabs (Atorvastatin Calcium) .... Take 1 By Mouth At Bedtime For Cholesterol 6)  Aspirin 81 Mg Tbec (Aspirin) .... Take One Tablet By Mouth Daily 7)  Robaxin-750 750 Mg Tabs (Methocarbamol) .... As Needed  Allergies: 1)  ! Lisinopril  Past History:  Past Medical History: 1. HTN 2. Nephrolithiasis 3. Poor medical compliance 4. Hyperlipidemia 5. Obese 6. Chest pain: Exertional.  Lexiscan myoview in 1/12 with EF 52% and a small reversible perfusion defect in diagonal territory either due to ischemia or shifting breast attenuation.  Echo  (1/12): EF 50-55%, mild LAE, mild LVH, no significant valvular abnormalities.   Family History: Reviewed history from 08/28/2010 and no changes required. HTN - multiple family members Hyperlipidemia Father with MI in his early 81s, had CABG in his 44s Multiple first cousins (5) with MIs in their 69s.   Siblings without known CAD  Social History: Reviewed history from 08/28/2010 and no changes required. 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   Vital Signs:  Patient profile:   45 year old female Height:      67 inches Weight:      328 pounds BMI:     51.56 Pulse rate:   75 / minute BP sitting:   130 / 62  (left arm)  Vitals Entered By: Laurance Flatten CMA (August 31, 2010 4:50 PM)  Physical Exam  General:  Well developed, well nourished, in no acute distress. Obese.  Neck:  Neck supple, no JVD. No masses, thyromegaly or abnormal cervical nodes. Lungs:  Clear bilaterally to auscultation and percussion. Heart:  Non-displaced PMI, chest non-tender; regular rate and rhythm, S1, S2 without rubs or gallops. 1/6 SEM RUSB.  Carotid upstroke normal, no bruit.  Pedals normal pulses. No edema, no varicosities. Abdomen:  Bowel sounds positive; abdomen soft and non-tender without masses, organomegaly, or hernias noted. No hepatosplenomegaly.  Extremities:  No clubbing or cyanosis. Neurologic:  Alert and oriented x 3. Psych:  Normal affect.   Impression & Recommendations:  Problem # 1:  CHEST PAIN UNSPECIFIED (ICD-786.50) Patient has had multiple episodes of exertional chest pain over the last 2 months.  She has not had any in the last week but took the week off work and did not do anything strenuous.  She had a myoview with EF 52% and possible diagonal ischemia (versus shifting breast attenuation).  Echo showed EF 50-55%.  CAD risk  factors include HTN, hyperlipidemia, and a family history of premature CAD.  Her father and multiple cousins had MIs in their 20s.  We discussed the diagnostic approaches at this point.  Being a 45 year old woman, in general her risk would be low.  However, with an equivocal myoview and her exertional CP as well as the equivocal myoview, I think at cath would be reasonable. A coronary CT angiogram would probably be limited by her body habitus.  We discussed the 07/998 risk of serious adverse event with cardiac catheterization. - Plan cath from radial approach. - Continue ASA, Toprol XL, ARB, and statin.   Problem # 2:  HYPERTENSION, UNSPECIFIED (ICD-401.9) Good BP control.  Continue current meds.   Problem # 3:  DYSLIPIDEMIA (ICD-272.4) Continue Lipitor.   Other Orders: Cardiac Catheterization (Cardiac Cath)  Patient Instructions: 1)  Your physician recommends that you return for lab Friday February 24,2012--BMP/CBC/PT 786.50  794.30 2)  Your physician has requested that you have a cardiac catheterization.  Cardiac catheterization is used to diagnose and/or treat various heart conditions. Doctors may recommend this procedure for a number of different reasons. The most common reason is to evaluate chest pain. Chest pain can be a symptom of coronary artery disease (CAD), and cardiac catheterization can show whether plaque is narrowing or blocking your heart's arteries. This procedure is also used to evaluate the valves, as well as measure the blood flow and oxygen levels in different parts of your heart.  For further information please visit [email or web address]  Please follow instruction sheet, as given. 3)  Wednesday February 29,2012 4)  Your physician recommends that you schedule a follow-up appointment with Dr Shirlee Latch Wednesday March 15,2012 at 12Noon.

## 2010-09-08 NOTE — Letter (Signed)
Summary: Cardiac Catheterization Instructions- [facility]  [facility], Main Office  [address]   [place], [place] [address]   Phone: [phone]  Fax: [phone]     08/31/2010 MRN: [id number]  [facility] [address] [place], [place]  [address]  [place]  Dear Ms. Thai,   You are scheduled for a Cardiac Catheterization on Wednesday February 29 with Dr. Marca Ancona.  Please go  to the 1st floor of the Heart and Vascular Center at [facility]. We will call you tomorrow with the time. Please do not arrive before 6:30 a.m. Call the Heart and Vascular Center at [phone] if you are unable to make your appointment. The Code to get into the parking garage under the building is 0300. Take the elevators to the 1st floor. You must have someone to drive you home. Someone must be with you for the first 24 hours after you arrive home. Please wear clothes that are easy to get on and off and wear slip-on shoes. Do not eat or drink after midnight except water with your medications that morning. Bring all your medications and current insurance cards with you.    COME TO OUR OFFICE ON FRIDAY FEBRUARY 24 FOR LAB. THE LAB OPENS AT 8:30AM.   _x__ You may take ALL of your medications with water that morning.   The usual length of stay after your procedure is 2 to 3 hours. This can vary.  If you have any questions, please call the office at the number listed above.   Katina Dung, RN, BSN  Appended Document: Cardiac Catheterization Instructions- [facility] scheduled for  09/09/10 11:30am--pt to arrive at 10:30am -- LM for pt on voice ID voice mail with time of cath  Appended Document: Cardiac Catheterization Instructions- [facility] rescheduled to March 6,2012 at 12:45 --see new instructions

## 2010-09-08 NOTE — Letter (Signed)
Summary: Cardiac Catheterization Instructions- [facility]  [facility], Main Office  [address]   [place], [place] [address]   Phone: [phone]  Fax: [phone]     09/02/2010 MRN: [id number]  [facility] [address] [place], [place]  [address]  [place]  Dear Jacqueline Ferrell,   You are scheduled for a Cardiac Catheterization on Tuesday March 6,2012 with Dr. Marca Ancona  Please arrive to the 1st floor of the Heart and Vascular Center at [facility] at 11:45  on the day of your procedure. Please do not arrive before 6:30 a.m. Call the Heart and Vascular Center at [phone] if you are unable to make your appointmnet. The Code to get into the parking garage under the building is 3000. Take the elevators to the 1st floor. You must have someone to drive you home. Someone must be with you for the first 24 hours after you arrive home. Please wear clothes that are easy to get on and off and wear slip-on shoes. Do not eat  midnight. You may have clear liquids until 6:45am. Nothing to eat or drink after 6:45am  except water with your medications that morning. Bring all your medications and current insurance cards with you.    _x__ You may take ALL of your medications with water that morning. ________________________________________________________________________________________________________________________________    COME TO OUR OFFICE FOR LAB ON FRIDAY MARCH 1,2012. THE LAB OPENS AT 8:30AM.  The usual length of stay after your procedure is 2 to 3 hours. This can vary.  If you have any questions, please call the office at the number listed above.   Katina Dung, RN, BSN  Appended Document: Cardiac Catheterization Instructions- [facility] lab scheduled for FRIDAY MARCH 2 ,2012

## 2010-09-10 ENCOUNTER — Other Ambulatory Visit: Payer: PRIVATE HEALTH INSURANCE

## 2010-09-11 ENCOUNTER — Encounter: Payer: Self-pay | Admitting: Cardiology

## 2010-09-11 ENCOUNTER — Other Ambulatory Visit (INDEPENDENT_AMBULATORY_CARE_PROVIDER_SITE_OTHER): Payer: PRIVATE HEALTH INSURANCE

## 2010-09-11 ENCOUNTER — Other Ambulatory Visit: Payer: Self-pay | Admitting: Cardiology

## 2010-09-11 DIAGNOSIS — R9430 Abnormal result of cardiovascular function study, unspecified: Secondary | ICD-10-CM

## 2010-09-11 DIAGNOSIS — R079 Chest pain, unspecified: Secondary | ICD-10-CM

## 2010-09-11 DIAGNOSIS — R0789 Other chest pain: Secondary | ICD-10-CM

## 2010-09-11 LAB — BASIC METABOLIC PANEL
BUN: 17 mg/dL (ref 6–23)
GFR: 91.15 mL/min (ref >60.00)
Potassium: 3.5 mEq/L (ref 3.5–5.1)
Sodium: 138 mEq/L (ref 135–145)

## 2010-09-11 LAB — CBC WITH DIFFERENTIAL/PLATELET
Basophils Relative: 0.3 % (ref 0.0–3.0)
Eosinophils Absolute: 0.1 10*3/uL (ref 0.0–0.7)
HCT: 39.3 % (ref 36.0–46.0)
Lymphocytes Relative: 30.5 % (ref 12.0–46.0)
MCV: 89 fl (ref 78.0–100.0)
Monocytes Relative: 5.2 % (ref 3.0–12.0)
Neutro Abs: 4.8 10*3/uL (ref 1.4–7.7)

## 2010-09-11 LAB — PROTIME-INR: INR: 1.1 ratio — ABNORMAL HIGH (ref 0.8–1.0)

## 2010-09-14 ENCOUNTER — Telehealth: Payer: Self-pay | Admitting: Cardiology

## 2010-09-15 ENCOUNTER — Inpatient Hospital Stay (HOSPITAL_BASED_OUTPATIENT_CLINIC_OR_DEPARTMENT_OTHER)
Admission: RE | Admit: 2010-09-15 | Discharge: 2010-09-15 | Disposition: A | Discharge status: Home / Self Care | Payer: PRIVATE HEALTH INSURANCE | Source: Ambulatory Visit | Attending: Cardiology | Admitting: Cardiology

## 2010-09-15 DIAGNOSIS — R9439 Abnormal result of other cardiovascular function study: Secondary | ICD-10-CM | POA: Insufficient documentation

## 2010-09-15 DIAGNOSIS — R079 Chest pain, unspecified: Secondary | ICD-10-CM | POA: Insufficient documentation

## 2010-09-15 DIAGNOSIS — Z8249 Family history of ischemic heart disease and other diseases of the circulatory system: Secondary | ICD-10-CM | POA: Insufficient documentation

## 2010-09-15 DIAGNOSIS — I10 Essential (primary) hypertension: Secondary | ICD-10-CM | POA: Insufficient documentation

## 2010-09-15 DIAGNOSIS — I2510 Atherosclerotic heart disease of native coronary artery without angina pectoris: Secondary | ICD-10-CM

## 2010-09-15 DIAGNOSIS — I701 Atherosclerosis of renal artery: Secondary | ICD-10-CM | POA: Insufficient documentation

## 2010-09-16 ENCOUNTER — Observation Stay (HOSPITAL_COMMUNITY)
Admission: RE | Admit: 2010-09-16 | Discharge: 2010-09-17 | Disposition: A | Discharge status: Home / Self Care | Payer: PRIVATE HEALTH INSURANCE | Source: Ambulatory Visit | Attending: Cardiology | Admitting: Cardiology

## 2010-09-16 DIAGNOSIS — I2510 Atherosclerotic heart disease of native coronary artery without angina pectoris: Secondary | ICD-10-CM

## 2010-09-16 DIAGNOSIS — I679 Cerebrovascular disease, unspecified: Secondary | ICD-10-CM | POA: Insufficient documentation

## 2010-09-16 DIAGNOSIS — E785 Hyperlipidemia, unspecified: Secondary | ICD-10-CM | POA: Insufficient documentation

## 2010-09-16 DIAGNOSIS — Y840 Cardiac catheterization as the cause of abnormal reaction of the patient, or of later complication, without mention of misadventure at the time of the procedure: Secondary | ICD-10-CM | POA: Insufficient documentation

## 2010-09-16 DIAGNOSIS — Z8249 Family history of ischemic heart disease and other diseases of the circulatory system: Secondary | ICD-10-CM | POA: Insufficient documentation

## 2010-09-16 DIAGNOSIS — Z6841 Body Mass Index (BMI) 40.0 and over, adult: Secondary | ICD-10-CM | POA: Insufficient documentation

## 2010-09-16 DIAGNOSIS — I498 Other specified cardiac arrhythmias: Secondary | ICD-10-CM | POA: Insufficient documentation

## 2010-09-16 DIAGNOSIS — I519 Heart disease, unspecified: Secondary | ICD-10-CM | POA: Insufficient documentation

## 2010-09-16 DIAGNOSIS — I10 Essential (primary) hypertension: Secondary | ICD-10-CM | POA: Insufficient documentation

## 2010-09-16 DIAGNOSIS — Z7982 Long term (current) use of aspirin: Secondary | ICD-10-CM | POA: Insufficient documentation

## 2010-09-16 LAB — BASIC METABOLIC PANEL
CO2: 26 mEq/L (ref 19–32)
Chloride: 106 mEq/L (ref 96–112)
Creatinine, Ser: 0.83 mg/dL (ref 0.4–1.2)
GFR calc non Af Amer: >60 mL/min (ref >60)
Potassium: 3.4 mEq/L — ABNORMAL LOW (ref 3.5–5.1)
Sodium: 139 mEq/L (ref 135–145)

## 2010-09-16 LAB — CBC
HCT: 39.4 % (ref 36.0–46.0)
Hemoglobin: 13.6 g/dL (ref 12.0–15.0)
MCHC: 34.5 g/dL (ref 30.0–36.0)
MCV: 86.2 fL (ref 78.0–100.0)
Platelets: 219 10*3/uL (ref 150–400)

## 2010-09-17 LAB — BASIC METABOLIC PANEL
BUN: 13 mg/dL (ref 6–23)
CO2: 23 mEq/L (ref 19–32)
Calcium: 8.4 mg/dL (ref 8.4–10.5)
Chloride: 105 mEq/L (ref 96–112)
Creatinine, Ser: 0.81 mg/dL (ref 0.4–1.2)
GFR calc Af Amer: >60 mL/min (ref >60)
GFR calc non Af Amer: >60 mL/min (ref >60)
Potassium: 3.7 mEq/L (ref 3.5–5.1)
Sodium: 137 mEq/L (ref 135–145)

## 2010-09-17 LAB — CBC
Hemoglobin: 12.9 g/dL (ref 12.0–15.0)
WBC: 6.1 10*3/uL (ref 4.0–10.5)

## 2010-09-21 ENCOUNTER — Telehealth: Payer: Self-pay | Admitting: Cardiology

## 2010-09-22 NOTE — Progress Notes (Addendum)
Summary: Cardiac Catheterization  Phone Note Call from Patient Call back at Home Phone [phone] [facility]     Caller: Patient Reason for Call: Talk to Nurse Summary of Call: pt has question re instruction for Cardiac Catheterization sent to her by ann lankford. Initial call taken by: Roe Coombs,  September 14, 2010 11:58 AM  Follow-up for Phone Call        09/14/10--1230pm--pt had question about arrival time for c cath on friday--advised to be at heart and vascular center at 11:45 am--pt agrees--nt Follow-up by: Ledon Snare, RN,  September 14, 2010 12:29 PM     Appended Document: Cardiac Catheterization I thought she was supposed to be on Tuesday.  I am in [place] on Friday.   Appended Document: Cardiac Catheterization pt is scheduled for Tuesday March 6 12:45 [facility] lab --I confirmed with pt that she will be at the Heart and Vascular Center tomorrow, Tuesday March 6,2012 at 11:45am

## 2010-09-22 NOTE — Cardiovascular Report (Signed)
Summary: [facility]: CDC Pre-Cath Orders  [facility]: CDC Pre-Cath Orders   Imported By: Earl Many 09/17/2010 17:21:51  _____________________________________________________________________  External Attachment:    Type:   Image     Comment:   External Document

## 2010-09-23 ENCOUNTER — Encounter: Payer: Self-pay | Admitting: Cardiology

## 2010-09-24 ENCOUNTER — Ambulatory Visit: Payer: PRIVATE HEALTH INSURANCE | Admitting: Cardiology

## 2010-09-25 NOTE — Procedures (Signed)
NAME:  Jacqueline Ferrell, Jacqueline Ferrell              ACCOUNT NO.:  [id number]  MEDICAL RECORD NO.:  [id number]          PATIENT TYPE:  LOCATION:                                 FACILITY:  PHYSICIAN:  Marca Ancona, MD      DATE OF BIRTH:  Mar 15, 1966  DATE OF PROCEDURE:  09/15/2010 DATE OF DISCHARGE:                           CARDIAC CATHETERIZATION   PROCEDURES: 1. Left heart catheterization. 2. Coronary angiography 3. Left ventriculography.  INDICATION:  This is a 45 year old with history of hypertension, hyperlipidemia, as well as a family history of premature coronary artery disease in multiple relatives as they have had MIs in their 61s.  The patient has noted exertional chest pain with activity such as grocery shopping, walking up steps, and other moderate exertion.  This has gone on for about a month now and has been worsening.  The patient had a Myoview showing a small area of mid anterior ischemia versus shifting breast attenuation.  PROCEDURE NOTE:  After informed consent was obtained, the patient underwent Allen's testing on the right wrist.  The right ulnar artery gave good collateral circulation to the radial side of the hand signifying positive Allen's test.  His right wrist was then sterilely prepped and draped.  Lidocaine 1% was used to locally anesthetize the right wrist in the right radial area.  The right radial artery was entered using modified Seldinger technique and a 5-French arterial sheath was placed.  The patient received 3 mg intra-arterial verapamil and 4000 units of IV heparin.  The right coronary artery was engaged using the JR-4 catheter and the left coronary artery was engaged using the JL-3.0 guide catheter.  The left ventricle was entered using angled pigtail catheter and there were no complications.  FINDINGS: 1. Hemodynamics.  Aorta 133/82, LV 158/18. 2. Left ventriculography.  EF was estimated to be 60% with normal wall     motion. 3. Right coronary  artery.  The right coronary artery was a dominant     vessel with mild luminal irregularities. 4. Left main.  The left main had no disease. 5. Left circumflex system.  There was a moderate-sized ramus with     minimal disease.  The left circumflex itself had minimal disease. 6. LAD system.  There was an 80% proximal LAD stenosis after the first     diagonal.  The area of significant stenosis was fairly discrete;     however, there was certainly an area of moderate disease around it.     The remainder of the mid-to-distal LAD had minimal disease.  IMPRESSION:  This is a 45 year old with a family history of premature coronary artery disease who has exertional chest pain as well as an abnormal Myoview.  Left heart catheterization showed an 80% proximal LAD stenosis.  This catheterization was discussed with Dr. Riley Kill.  We will plan percutaneous coronary intervention tomorrow.  We will start Effient today.     Marca Ancona, MD     DM/MEDQ  D:  09/15/2010  T:  09/16/2010  Job:  [id number]  cc:   Standley Dakins, MD  Electronically Signed by Marca Ancona MD on 09/25/2010  09:33:09 PM

## 2010-09-28 ENCOUNTER — Encounter: Payer: Self-pay | Admitting: Cardiology

## 2010-09-28 ENCOUNTER — Encounter (INDEPENDENT_AMBULATORY_CARE_PROVIDER_SITE_OTHER): Payer: PRIVATE HEALTH INSURANCE | Admitting: Cardiology

## 2010-09-28 ENCOUNTER — Encounter: Payer: PRIVATE HEALTH INSURANCE | Admitting: Cardiology

## 2010-09-28 ENCOUNTER — Encounter: Payer: Self-pay | Admitting: Cardiovascular Disease

## 2010-09-28 DIAGNOSIS — I2510 Atherosclerotic heart disease of native coronary artery without angina pectoris: Secondary | ICD-10-CM | POA: Insufficient documentation

## 2010-09-29 NOTE — Progress Notes (Signed)
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 Omer Jack  September 22, 2010 1:11 PM  Will pass this information to A.Lankford RN and Dr. Shirlee Latch.  Dessie Coma LPN     Appended Document: need notes to return to work on 09/24/10 with regular duties Cannot lift more than 15 lbs until 3/21.   Appended Document: need notes to return to work on 09/24/10 with regular duties discussed with pt--letter left at front desk for pt to pickup

## 2010-09-29 NOTE — Letter (Signed)
Summary: Return To Work  [employer], Main Office  [address]   [place], [place] [address]   Phone: [phone]  Fax: [phone]    09/23/2010  TO: Leodis Sias IT MAY CONCERN   RE: Jacqueline Ferrell Puckett [address] [place]   The above named individual is under my medical care and may return to work on: March 15,2012. She cannot  lift more than 15 pounds until 09/30/10.  If you have any further questions or need additional information, please call.     Sincerely,      Marca Ancona, MD

## 2010-10-08 ENCOUNTER — Encounter: Payer: Self-pay | Admitting: Cardiology

## 2010-10-08 NOTE — Assessment & Plan Note (Signed)
Summary: post hospital/AMD   Visit Type:  Follow-up Primary Provider:  Dr. Standley Dakins  CC:  c/o tightness in chest. denies SOB.  History of Present Illness: 45 yo with history of HTN , hyperlipidemia, and CAD presents for followup after LAD PCI.  Patient initially presented with progressive exertional chest pain and had an abnormal myoview.  Left heart cath showed 80% proximal LAD stenosis, and she recieved a Promus drug-eluting stent to the proximal LAD.  Since that time, she has had only 1 episode of chest tightness (while walking on a track).  She feels much better with resolution of most of her chest pain and dyspnea symptoms.  She wants to go back to work.   Labs (1/12): LDL 49, HDL 57, TSH normal, K 4.3, creatinine 1.3 Labs (3/12): creatinine 0.81  ECG: NSR, normal  Current Medications (verified): 1)  Vicodin 5-500 Mg Tabs (Hydrocodone-Acetaminophen) .... As Needed 2)  Losartan Potassium-Hctz 50-12.5 Mg Tabs (Losartan Potassium-Hctz) .... Take 1 By Mouth Daily For Blood Pressure 3)  Metoprolol Succinate 25 Mg Xr24h-Tab (Metoprolol Succinate) .... Take 1 By Mouth Daily For Blood Pressure 4)  Lipitor 40 Mg Tabs (Atorvastatin Calcium) .... Take 1 By Mouth At Bedtime For Cholesterol 5)  Aspirin 81 Mg Tbec (Aspirin) .... Take One Tablet By Mouth Daily 6)  Robaxin-750 750 Mg Tabs (Methocarbamol) .... As Needed 7)  Vitamin D3 1000 Unit Tabs (Cholecalciferol) .Marland Kitchen.. 1 Tablet Once Daily 8)  Omega-3 350 Mg Caps (Omega-3 Fatty Acids) .Marland Kitchen.. 1 Tablet Once Daily 9)  Multivitamins  Tabs (Multiple Vitamin) .Marland Kitchen.. 1 Tablet Once Daily 10)  Caltrate 600 1500 Mg Tabs (Calcium Carbonate) .Marland Kitchen.. 1 Tablet Once Daily 11)  Effient 10 Mg Tabs (Prasugrel Hcl) .... Take One Tablet By Mouth Daily  Allergies (verified): 1)  ! Lisinopril  Past History:  Past Surgical History: Last updated: 08/28/2010 Hysterectomy - 2009  Family History: Last updated: 08/31/2010 HTN - multiple family  members Hyperlipidemia Father with MI in his early 34s, had CABG in his 70s Multiple first cousins (5) with MIs in their 10s.   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 HTN 2. Nephrolithiasis 3. Poor medical compliance 4. Hyperlipidemia 5. Obese 6. CAD: Progressive exertional chest pain in 1/12.  Lexiscan myoview in 1/12 with EF 52% and a small reversible perfusion defect in diagonal territory either due to ischemia or shifting breast attenuation.  Echo (1/12): EF 50-55%, mild LAE, mild LVH, no significant valvular abnormalities.   LHC (3/12) showed 80% proximal LAD stenosis with EF 60%.  Patient had Promus DES to proximal LAD.   Family History: Reviewed history from 08/31/2010 and no changes required. HTN - multiple family members Hyperlipidemia Father with MI in his early 97s, had CABG in his 35s Multiple first cousins (5) with MIs in their 41s.   Siblings without known CAD  Social History: Reviewed history from 08/28/2010 and no changes required. 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   Vital Signs:  Patient profile:   45 year old female Height:      67 inches Weight:  320.75 pounds BMI:     50.42 Pulse rate:   74 / minute BP sitting:   138 / 78  (left arm) Cuff size:   large  Vitals Entered By: Lysbeth Galas CMA (September 28, 2010 2:37 PM)  Physical Exam  General:  Well developed, well nourished, in no acute distress. Obese.  Neck:  Neck supple, no JVD. No masses, thyromegaly or abnormal cervical nodes. Lungs:  Clear bilaterally to  auscultation and percussion. Heart:  Non-displaced PMI, chest non-tender; regular rate and rhythm, S1, S2 without rubs or gallops. 1/6 SEM RUSB.  Carotid upstroke normal, no bruit.  Pedals normal pulses. No edema, no varicosities. Abdomen:  Bowel sounds positive; abdomen soft and non-tender without masses, organomegaly, or hernias noted. No hepatosplenomegaly. Extremities:  No clubbing or cyanosis. Neurologic:  Alert and oriented x 3. Psych:  Normal affect.   Impression & Recommendations:  Problem # 1:  CAD, NATIVE VESSEL (ICD-414.01) Symptomatically much improved since PCI.  Continue ASA, Effient x 1 year, ARB, beta blocker, and statin.  She plans to do cardiac rehab.  Wednesday will be 2 weeks after her procedure.  I will let her go back to work at that point.    Problem # 2:  HYPERTENSION, UNSPECIFIED (ICD-401.9) BP reasonably controlled.   Problem # 3:  DYSLIPIDEMIA (ICD-272.4) Goal LDL < 70.  Check lipids/LFTs.   Patient needs to work on diet and exercise for weight loss.   Other Orders: EKG w/ Interpretation (93000)  Patient Instructions: 1)  Your physician recommends that you schedule a follow-up appointment in: 3 months. 2)  Your physician recommends referral and attendance at a Cardiac Rehab Program.

## 2010-10-08 NOTE — Letter (Signed)
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 [address]   [place], [place] [address]   Phone: [phone]  Fax: [phone]    09/28/2010  TO: Leodis Sias IT MAY CONCERN   RE: JAYLEI FUERTE Hyslop [address] [place]   The above named individual is under my medical care and may return to work on: September 30, 2010 with no restrictions.  If you have any further questions or need additional information, please call.     Sincerely,    Marca Ancona, M.D.

## 2010-10-11 ENCOUNTER — Encounter: Payer: Self-pay | Admitting: Cardiology

## 2010-10-21 ENCOUNTER — Encounter: Payer: PRIVATE HEALTH INSURANCE | Admitting: Cardiology

## 2010-10-22 NOTE — Procedures (Signed)
NAME:  [name], [name]                ACCOUNT NO.:  [id number]  MEDICAL RECORD NO.:  [id number]           PATIENT TYPE:  I  LOCATION:  6531                         FACILITY:  [facility]  PHYSICIAN:  Arturo Morton. Riley Kill, MD, FACCDATE OF BIRTH:  October 07, 1965  DATE OF PROCEDURE:  09/16/2010 DATE OF DISCHARGE:                           CARDIAC CATHETERIZATION   INDICATIONS:  [name] is a 45 year old who has had some exertional angina.  She underwent catheterization by Dr. Shirlee Latch yesterday demonstrating a high-grade stenosis in the left anterior descending artery.  The stenosis also had segmental plaque distal.  There is a diagonal branch that is very small in caliber that also has mid disease, but supplies much smaller area.  After discussing the options with her, Dr. Shirlee Latch recommended percutaneous coronary intervention.  I reviewed the films.  I then discussed the case with the patient in detail.  She was agreeable to proceed.  PROCEDURE:  Percutaneous stenting of the left anterior descending artery using a drug-eluting stent.  DESCRIPTION OF PROCEDURE:  The patient was brought to the catheterization laboratory and prepped and draped in usual fashion. Using a Smart needle, the anterior portion of the femoral artery was entered.  A 6-French sheath was then placed.  Views of the left coronary artery were then obtained.  Bivalirudin was given according to protocol. ACT was checked and found to be appropriate for percutaneous coronary intervention.  The lesion was then crossed using intuition wire. Following this, we elected to direct stent the LAD.  A 28 x 2.75 Promus element drug-eluting stent was then passed to the mid left anterior descending artery.  He was deployed at approximately 14 atmospheres. Postdilatation was then done with a 3.25 noncompliant Trek.  The stent was fully deployed in an area of mid mal-expansion.  The final angiographic results were excellent.  ANGIOGRAPHIC  DATA: 1. The left main coronary artery is free of critical disease. 2. The left anterior descending artery courses to the apex.  There is     a midportion of segmental disease.  This is brought out fairly     limited views as noted in the catheterization of the day earlier.     There is disease of the diagonal noted with a moderate stenosis of     the midportion of the moderate-sized diagonal and some ostial     plaquing as well.  The native LAD after intracoronary nitroglycerin     demonstrates about an 80% area of focal stenosis followed by an     area of segmental disease.  After percutaneous stenting, the area     is reduced to 0% residual luminal narrowing.  The final     angiographic result was excellent.  No evidence of edge tear was     noted.  Distal runoff into the LAD was felt to be satisfactory.  CONCLUSIONS:  Successful percutaneous stenting of the mid left anterior descending artery using a drug-eluting stent, specifically Promus element.  DISPOSITION:  The patient will be treated with dual antiplatelet therapy.  Antiplatelet therapy for approximately 1 year will be recommended.  Following this, she can  be continued on aspirin.  Final decisions regarding length of dual antiplatelet therapy will be made by Dr. Shirlee Latch.  Reevaluation with regard to a residual disease, I will continue in followup.     Arturo Morton. Riley Kill, MD, [facility]     TDS/MEDQ  D:  09/16/2010  T:  09/17/2010  Job:  [id number]  cc:   Marca Ancona, MD  Electronically Signed by Shawnie Pons MD [facility] on 10/22/2010 05:37:25 AM

## 2010-10-22 NOTE — Discharge Summary (Signed)
NAME:  Jacqueline Ferrell, Jacqueline Ferrell                ACCOUNT NO.:  [id number]  MEDICAL RECORD NO.:  [id number]           PATIENT TYPE:  I  LOCATION:  6531                         FACILITY:  [facility]  PHYSICIAN:  Arturo Morton. Riley Kill, MD, FACCDATE OF BIRTH:  11/19/1965  DATE OF ADMISSION:  09/16/2010 DATE OF DISCHARGE:  09/17/2010                              DISCHARGE SUMMARY   DISCHARGE DIAGNOSES: 1. Coronary artery disease.     a.     Cardiac catheterization, September 15, 2010:  An 80% proximal LAD      stenosis, otherwise no significant CAD with normal LV systolic      function, LVEF 60% with normal wall motion.     b.     Cardiac catheterization with PCI using 2.75- x 28-mm Promus      Element DES to the 80% lesion in the proximal-mid LAD, September 16, 2010:  Successful PCI. 2. Bradycardia.     a.     Brief bradycardia requiring atropine, vagal event after      sheath pulled postcath. 3. Morbid obesity (body mass index 51.4).  SECONDARY DIAGNOSES: 1. Hypertension. 2. Hyperlipidemia. 3. Poor medication compliance. 4. History of nephrolithiasis.  ALLERGIES AND INTOLERANCES:  LISINOPRIL (unknown reaction).  PROCEDURES: 1. Cardiac catheterization, September 15, 2010:  Please see discharge     diagnoses #1 subsection A. 2. Cardiac catheterization, September 16, 2010:  Please see discharge     diagnosis #1 subsection B. 3. EKG, September 17, 2010:  Sinus bradycardia with sinus arrhythmia, 58     bpm, minimal T-wave inversion in leads V1 and lead III only, slight     left axis deviation with no evidence of hypertrophy, delayed R-wave     progression.  PR 186, QRS 88, and QTc of 406.  HISTORY OF PRESENT ILLNESS:  [name] is a 45 year old African American female with the above-noted complex medical history which also include a positive family history for premature coronary artery disease (multiple relatives with MIs in their 76s) who was seen and evaluated by Dr. Marca Ancona on August 03, 2010, for symptoms  consistent with angina.  She underwent Myoview, it showed normal EF at 52% but a small area of  ischemia versus shifting breast attenuation.  The patient subsequently scheduled for diagnostic cardiac catheterization and presents for that procedure.  HOSPITAL COURSE:  The patient admitted and underwent procedures as described above.  She did unfortunately have significant coronary artery disease in the proximal-mid LAD.  As her initial procedure was completed by Dr. Marca Ancona, she was scheduled for PCI by her interventional cardiologist, Dr. Shawnie Pons, the following day.  The patient underwent that procedure without significant complications except for postcath during a sheath pull with pressure being applied to the groin. The patient had a vagal event and required 0.5 of atropine secondary to bradycardia.  The patient quickly recovered and did not have any significant bradycardia/vagal events the duration of her hospital course.  She was kept overnight and seen in the morning by her interventional cardiologist, Dr. Shawnie Pons.  She was deemed stable for discharge  with meds outlined in the discharge medications section and outline in followup section.  At the time of her discharge, the patient received her new medication list, prescriptions, followup instructions, and postcath instructions.  All questions and concerns were addressed prior to leaving the hospital.  Note, the patient did ambulate 600 feet with cardiac rehab phase I as well as receiving extensive education/instruction as to the importance of heart-healthy diet lifestyle, medical compliance, and plan in case of return of angina.  DISCHARGE LABORATORY DATA:  WBCs 6.1, HGB 12.9, HCT 38.7, PLT count 204,000.  Sodium 137, potassium 3.7, chloride 105, bicarb 23, BUN 13, creatinine 0.81, and glucose 80.  FOLLOWUP PLANS AND APPOINTMENTS:  Dr. Marca Ancona in the next 2-3 weeks.  DISCHARGE MEDICATIONS.: 1.  Sublingual nitroglycerin 0.4 mg q. 5 minutes up to three doses     p.r.n. for chest discomfort. 2. Prasugrel/Effient 10 mg 1 tablet p.o. daily. 3. Aspirin 81 mg p.o. daily. 4. Calcium carbonate 1 tablet p.o. daily. 5. Fish oil over the counter one capsule daily. 6. Lipitor 40 mg 1 tablet p.o. at bedtime. 7. Losartan/hydrochlorothiazide 50/12.5 mg 1 tablet daily. 8. Metoprolol succinate 25 mg 1 tablet p.o. daily. 9. Multivitamin daily. 10.Robaxin 750 mg 1 tablet q.8 h. p.r.n. for spasms. 11.Vicodin 5/500 1 tablet p.o. q.6 h. p.r.n.  Duration of discharge encounter including physician time was 35 minutes.     Jarrett Ables, PAC   ______________________________ Arturo Morton. Riley Kill, MD, [facility]    MS/MEDQ  D:  09/17/2010  T:  09/18/2010  Job:  [id number]  cc:   Marca Ancona, MD Standley Dakins, MD  Electronically Signed by Jarrett Ables PAC on 09/29/2010 02:49:26 PM Electronically Signed by Shawnie Pons MD [facility] on 10/22/2010 05:37:27 AM

## 2010-10-29 ENCOUNTER — Encounter: Payer: Self-pay | Admitting: Cardiology

## 2010-11-10 ENCOUNTER — Encounter: Payer: Self-pay | Admitting: Cardiology

## 2010-11-27 NOTE — Consult Note (Signed)
NAME:  Ferrell, Jacqueline                            ACCOUNT NO.:  [id number]   MEDICAL RECORD NO.:  [id number]                   PATIENT TYPE:  EMS   LOCATION:  MINO                                 FACILITY:  [facility]   PHYSICIAN:  Melvyn Novas, M.D.               DATE OF BIRTH:  September 04, 1965   DATE OF CONSULTATION:  DATE OF DISCHARGE:                                   CONSULTATION   HISTORY:  [name] is a 45 year old African-American female who was  seen in the ER last weekend for similar complaints that she presents with  today, a severe headache. Last weekend she was evaluated by MRI/MRA and  learned that it had shown a possible pituitary bleed, but no aneurysm in the  range of larger than 3 or 4 mm. The patient's severe headache continued  since last Friday and for the same time she had continued left arm numbness  with a feeling she describes as like a hit my funny bone and face numbness  on the left as well. She describes this more towards the trigeminal branches  2 and 3. Her headache was a dull headache and progressed. It was not thunder  clap. It did not associate with visual loss. Also she does suffer  occasionally from blurring of vision. She did not have neck rigor and had  only right-sided neck occasional spasm or tenderness. The patient's  migrainous history have a long history. She states they began when she began  having periods. Sometimes associated with blurring of vision, nausea, and  vomiting. She, again, has no history of papiledema. Goes regularly to the  eye doctor. She is under a lot of stress right now, not sleeping well, has  financial difficulties. She lost her health insurance when laid off and is  currently followed by [facility]. She has noticed unilaterally  right arm and neck discomfort and no meningism, but she had vertigo later  last evening and then a feeling that she was going to pass out. She was  driving, pulled her car to the  right and believes that she did faint  actually and then was seen here in the ER for a syncope. Dr. Wallace Cullens performed  and LP which showed 15 white blood cells performed only on one specimen.  There were no white blood cells available. No protein, glucose, or other  tests back yet.   CURRENT MEDICATIONS:  The patient is on no current medications. She takes  over-the-counter medications for headaches.   ALLERGIES:  No known drug allergies.   PAST MEDICAL HISTORY:  1. Migraine headaches.  2. Obesity.  3. Multiple podiatric surgeries.  4. Exploratory abdominal surgery eight years ago.  5. Fractured leg, right ankle eight years ago.  6. Tubal ligation four years ago.   SOCIAL HISTORY:  The patient has two healthy children. She is divorced. She  is a nonsmoker.  She drinks very rarely alcohol. She is currently unemployed.  She is seeing Dr. ___________ at [facility]. She does have a  gynecologist and sees an eye doctor. She had previously been followed by Dr.  Jenna Luo when she had health insurance.   PHYSICAL EXAMINATION:  VITAL SIGNS:  On admission on October 27 blood  pressure 180/120, now 119/46. The patient had orthostatic problems  imaginable given the drop in diastolic pressures. Her heart rate remained in  the 70s-80s. Her respiratory rate was 18. Temperature 97. She had 99%  oxygenation.  HEENT:  Her mucous membranes are well-perfused. She has anicteric sclerae.  Normocephalic. Atraumatic head. No peripheral bruises, rash, cyanosis. No  carotid bruit. Peripheral pulses are palpable in spite of the obesity of the  patient.  MENTAL STATUS:  Alert and oriented. No aphagia or apraxia. Speech is intact.  Cranial nerves 2-12:  Pupils are equal. Full extraocular movements. Full  visual fields. No papiledema. No facial weakness, no numbness. No tongue and  uvula abnormalities. Tragus intact.  NECK:  Supple. Bilateral carotid pulses palpable. Paraspinal palpation shows   also no tenderness today.  The patient's shoulder shrug is equal. Sternocleidomastoid is equal in  strength.  MOTOR:  General strength and tone is equal bilaterally. Deep tendon reflexes  are attenuated, which I attribute to the patient's debility. Pursing and  nose touch is intact. No ataxia, dysmetria, or tremor.  SENSORY:  The patient shows no primary deficit to fine touch, pinprick,  temperature or vibration.   ASSESSMENT:  Migrainous headache lasting about eye days with slow  progression over one week. Finally yesterday having a crescendo event with  vertigo and syncope spell as well as hypertension. No meningismus, no  thunder clap headache. Afebrile. LP not suggestive of subarachnoid bleed.  Verbal report from the ER was given to me that the patient had an aneurysm.  This is not true. The MRA raised the question of a 1-2 mm PCA aneurysm, but  this size range cannot truly be evaluated by an MRA reliably and is in any  way not an aneurysm size that leads to subarachnoid bleeds in the first  place. The patient has migrainous headaches for a long time and this could  be a manifestation of a complicated migraine. This could also be a cervical  C7-C8 impingement involving the lateral three fingers of the right hand into  brachial, elbow, shoulder and right neck. The patient needs to have a follow-  up MRI/MRA in six months. I agree with the assessment by Dr. Wallace Cullens. A letter  should have been sent to her primary care physician, otherwise I ask kindly  to have this letter carbon copied to [facility]. Carbon copy  Dr. Hilda Lias.  Apparently he was consulted by Dr. Wallace Cullens today to  evaluate the patient for the possibility of pituitary bleed. I will see the  patient in three months for a migraine headache follow-up. My suggestion for  migraine prevention is to start the patient on a beta blocker and/or  hydrochlorothiazide. I hope she is able to maintain this medication as  a generic form through [facility]. She will receive today a  prescription from me here in the ER. Pending further LP, further laboratory  results, and x-ray results, it would be beneficial to know if the patient is  in need of anti-arthritis medication related to the possible impingement of  the cervical spine, C7-C8 root. I suggested ibuprofen to the patient and  neck exercises to start with.                                               Melvyn Novas, M.D.    CD/MEDQ  D:  05/09/2003  T:  05/09/2003  Job:  [id number]   cc:   [facility]   Melvyn Novas, M.D.  [address]  [address]  [place]  [place] [address]  Fax: [phone]   Hilda Lias, M.D.  [address]  [place], [place] [address]  Fax: [phone]

## 2010-12-11 ENCOUNTER — Encounter: Payer: Self-pay | Admitting: Cardiology

## 2010-12-18 ENCOUNTER — Inpatient Hospital Stay: Payer: Self-pay | Admitting: Internal Medicine

## 2010-12-18 DIAGNOSIS — R079 Chest pain, unspecified: Secondary | ICD-10-CM

## 2010-12-24 ENCOUNTER — Encounter: Payer: Self-pay | Admitting: Cardiology

## 2011-01-01 ENCOUNTER — Ambulatory Visit (INDEPENDENT_AMBULATORY_CARE_PROVIDER_SITE_OTHER): Payer: PRIVATE HEALTH INSURANCE | Admitting: Cardiology

## 2011-01-01 ENCOUNTER — Encounter: Payer: Self-pay | Admitting: Cardiology

## 2011-01-01 DIAGNOSIS — I2510 Atherosclerotic heart disease of native coronary artery without angina pectoris: Secondary | ICD-10-CM

## 2011-01-01 DIAGNOSIS — E785 Hyperlipidemia, unspecified: Secondary | ICD-10-CM

## 2011-01-01 DIAGNOSIS — I10 Essential (primary) hypertension: Secondary | ICD-10-CM

## 2011-01-01 MED ORDER — ATORVASTATIN CALCIUM 40 MG PO TABS: 40.0000 mg | ORAL_TABLET | Freq: Every day | ORAL | Status: DC

## 2011-01-01 MED ORDER — METOPROLOL SUCCINATE ER 25 MG PO TB24: 25.0000 mg | ORAL_TABLET | Freq: Every day | ORAL | Status: DC

## 2011-01-01 MED ORDER — EZETIMIBE 10 MG PO TABS: 10.0000 mg | ORAL_TABLET | Freq: Every day | ORAL | Status: DC

## 2011-01-01 MED ORDER — LOSARTAN POTASSIUM-HCTZ 50-12.5 MG PO TABS: 1.0000 | ORAL_TABLET | Freq: Every day | ORAL | Status: DC

## 2011-01-01 NOTE — Patient Instructions (Signed)
Your physician recommends that you return for a FASTING lipid profile: in August 2012 (lipid/lft)  Your physician recommends that you schedule a follow-up appointment in: 3 months

## 2011-01-03 NOTE — Progress Notes (Signed)
45 yo with history of HTN , hyperlipidemia, and CAD presents for followup after LAD PCI.  Patient initially presented with progressive exertional chest pain and had an abnormal myoview.  Left heart cath showed 80% proximal LAD stenosis, and she recieved a Promus drug-eluting stent to the proximal LAD.    Patient was readmitted earlier this month with chest pain associated with emotional stress.  ETT-myoview showed no ischemia or infarction.  Since discharge, her husband has left (which she says actually helped her stress level a lot), and she learned to cope with some things at work.  She is under less stress now and denies any further chest pain.  She also started Celexa 10 mg daily which has helped.    She is no longer in cardiac rehab but walks 3 miles 3-5 times a week.  Weight is going down.  No exertional chest pain or dyspnea.   Labs (1/12): LDL 49, HDL 57, TSH normal, K 4.3, creatinine 1.3 Labs (3/12): creatinine 0.81 Labs (6/12): LDL 149  ECG: NSR, poor anterior R wave progression  Allergies (verified):  1)  ! Lisinopril  Family History: HTN - multiple family members Hyperlipidemia Father with MI in his early 64s, had CABG in his 58s Multiple first cousins (5) with MIs in their 7s.   Siblings without known CAD  Social History: Separated. 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 HTN 2. Nephrolithiasis 3. Poor medical compliance 4. Hyperlipidemia 5. Obese 6. CAD: Progressive exertional chest pain in 1/12.  Lexiscan myoview in 1/12 with EF 52% and a small reversible perfusion defect in diagonal territory either due to ischemia or shifting breast attenuation.  Echo (1/12): EF 50-55%, mild LAE, mild LVH, no significant valvular abnormalities.   LHC (3/12) showed 80% proximal LAD stenosis with EF 60%.  Patient had Promus DES to proximal LAD.  ETT-myoview (6/12) with recurrent chest pain:  EF 54%, no evidence for ischemia or infarction.    Current Outpatient Prescriptions  Medication Sig Dispense Refill  . ALPRAZolam (XANAX) 0.25 MG tablet Take 0.25 mg by mouth at bedtime as needed.        Marland Kitchen aspirin 81 MG EC tablet Take 81 mg by mouth daily.        Marland Kitchen atorvastatin (LIPITOR) 40 MG tablet Take 1 tablet (40 mg total) by mouth daily.  30 tablet  6  . Calcium Carbonate (CALTRATE 600) 1500 MG TABS Take 1 tablet by mouth daily.        . Cholecalciferol (VITAMIN D3) 1000 UNITS tablet Take 1,000 Units by mouth daily.        . citalopram (CELEXA) 10 MG tablet Take 10 mg by mouth daily.        Marland Kitchen ezetimibe (ZETIA) 10 MG tablet Take 1 tablet (10 mg total) by mouth daily.  30 tablet  6  . HYDROcodone-acetaminophen (VICODIN) 5-500 MG per tablet Take 1 tablet by mouth every 6 (six) hours as needed.        Marland Kitchen losartan-hydrochlorothiazide (HYZAAR) 50-12.5 MG per tablet Take 1 tablet by mouth daily.  30 tablet  6  . methocarbamol (ROBAXIN) 750 MG tablet Take 750 mg by mouth as needed.        . metoprolol succinate (TOPROL-XL) 25 MG 24 hr tablet Take 1 tablet (25 mg total) by mouth daily.  30 tablet  6  . Multiple Vitamin (MULTIVITAMIN) tablet Take 1 tablet by mouth  daily.        . Omega-3 350 MG CAPS Take 1 capsule by mouth daily.        . prasugrel (EFFIENT) 10 MG TABS Take 10 mg by mouth daily.        . traMADol (ULTRAM) 50 MG tablet Take 50 mg by mouth every 6 (six) hours as needed.          BP 143/89  Pulse 68  Ht 5' 7.5" (1.715 m)  Wt 314 lb 1.9 oz (142.484 kg)  BMI 48.47 kg/m2 General:  Well developed, well nourished, in no acute distress. Obese.  Neck:  Neck supple, no JVD. No masses, thyromegaly or abnormal cervical nodes. Lungs:  Clear bilaterally to auscultation and percussion. Heart:  Non-displaced PMI, chest non-tender; regular rate and rhythm, S1, S2 without rubs or gallops. 1/6 SEM RUSB.  Carotid upstroke normal, no bruit.  Pedals normal pulses. 1+ ankle edema, no  varicosities. Abdomen:  Bowel sounds positive; abdomen soft and non-tender without masses, organomegaly, or hernias noted. No hepatosplenomegaly. Extremities:  No clubbing or cyanosis. Neurologic:  Alert and oriented x 3. Psych:  Normal affect.

## 2011-01-04 ENCOUNTER — Encounter: Payer: Self-pay | Admitting: Cardiovascular Disease

## 2011-01-04 DIAGNOSIS — I10 Essential (primary) hypertension: Secondary | ICD-10-CM | POA: Insufficient documentation

## 2011-01-04 NOTE — Assessment & Plan Note (Signed)
Status post PCI to LAD.  Needs Effient at least 1 year.  Episode that sent her to the ER earlier this month seems to have been stress related.  Myoview was negative for ischemia or infarction.  No further chest pain.  No exertional symptoms.  Continue Effient, statin, ASA 81, Toprol XL, losartan.

## 2011-01-04 NOTE — Assessment & Plan Note (Signed)
LDL quite high.  Zetia was recently added to her regimen.  I will get lipids/LFTs in 8/12.  Goal LDL < 70.

## 2011-01-04 NOTE — Assessment & Plan Note (Signed)
BP is mildly elevated today but is normal whenever she checks it at work (frequently).  Continue the same meds for now.

## 2011-01-10 ENCOUNTER — Encounter: Payer: Self-pay | Admitting: Cardiology

## 2011-02-10 ENCOUNTER — Encounter: Payer: Self-pay | Admitting: Cardiology

## 2011-02-10 ENCOUNTER — Other Ambulatory Visit: Payer: PRIVATE HEALTH INSURANCE | Admitting: *Deleted

## 2011-02-16 ENCOUNTER — Ambulatory Visit (INDEPENDENT_AMBULATORY_CARE_PROVIDER_SITE_OTHER): Payer: PRIVATE HEALTH INSURANCE | Admitting: *Deleted

## 2011-02-16 DIAGNOSIS — E785 Hyperlipidemia, unspecified: Secondary | ICD-10-CM

## 2011-02-17 LAB — HEPATIC FUNCTION PANEL
Albumin: 4 g/dL (ref 3.5–5.2)
Alkaline Phosphatase: 56 U/L (ref 39–117)
Indirect Bilirubin: 0.3 mg/dL (ref 0.0–0.9)
Total Bilirubin: 0.4 mg/dL (ref 0.3–1.2)

## 2011-02-17 LAB — LIPID PANEL
HDL: 32 mg/dL — ABNORMAL LOW (ref >39)
Total CHOL/HDL Ratio: 4.4 Ratio
Triglycerides: 55 mg/dL (ref <150)

## 2011-02-19 ENCOUNTER — Telehealth: Payer: Self-pay | Admitting: *Deleted

## 2011-02-19 MED ORDER — ATORVASTATIN CALCIUM 80 MG PO TABS: 80.0000 mg | ORAL_TABLET | Freq: Every day | ORAL | Status: DC

## 2011-02-19 NOTE — Telephone Encounter (Signed)
Notified pt of lab results. Pt will incr lipitor to 80mg  daily, new rx sent today.

## 2011-02-19 NOTE — Telephone Encounter (Signed)
Message copied by Annia Belt on Fri Feb 19, 2011  8:34 AM ------      Message from: Laurey Morale      Created: Thu Feb 18, 2011  9:49 PM       Goal LDL < 70.  Still above goal.  Increase Lipitor to 80 mg daily.

## 2011-03-13 ENCOUNTER — Encounter: Payer: Self-pay | Admitting: Cardiology

## 2011-04-12 ENCOUNTER — Encounter: Payer: Self-pay | Admitting: Cardiology

## 2011-04-26 LAB — CBC
HCT: 32.3 — ABNORMAL LOW
Hemoglobin: 10.3 — ABNORMAL LOW
MCHC: 31.7
MCV: 74.1 — ABNORMAL LOW
Platelets: 346
RBC: 4.36
RDW: 18.5 — ABNORMAL HIGH
WBC: 7.6

## 2011-04-26 LAB — LIPID PANEL
Cholesterol: 297 — ABNORMAL HIGH
HDL: 29 — ABNORMAL LOW
LDL Cholesterol: 244 — ABNORMAL HIGH
Total CHOL/HDL Ratio: 10.2
Triglycerides: 119
VLDL: 24

## 2011-04-26 LAB — DIFFERENTIAL
Basophils Absolute: 0
Basophils Relative: 0
Eosinophils Absolute: 0.1
Eosinophils Relative: 1
Lymphocytes Relative: 31
Lymphs Abs: 2.4
Monocytes Absolute: 0.5
Monocytes Relative: 6
Neutro Abs: 4.7
Neutrophils Relative %: 62

## 2011-04-26 LAB — HEPATIC FUNCTION PANEL
ALT: 11
AST: 15
Albumin: 3.5
Alkaline Phosphatase: 57
Bilirubin, Direct: <0.1
Total Bilirubin: 0.5
Total Protein: 7.1

## 2011-04-26 LAB — POCT I-STAT CREATININE
Creatinine, Ser: 0.8
Operator id: 116391

## 2011-04-26 LAB — POCT URINALYSIS DIP (DEVICE)
Bilirubin Urine: NEGATIVE
Glucose, UA: NEGATIVE
Hgb urine dipstick: NEGATIVE
Ketones, ur: NEGATIVE
Nitrite: NEGATIVE
Operator id: 116391
Protein, ur: 30 — AB
Specific Gravity, Urine: >=1.03
Urobilinogen, UA: 0.2
pH: 6

## 2011-04-26 LAB — I-STAT 8, (EC8 V) (CONVERTED LAB)
Acid-base deficit: 3 — ABNORMAL HIGH
BUN: 12
Bicarbonate: 22.1
Chloride: 107
Glucose, Bld: 91
HCT: 37
Hemoglobin: 12.6
Operator id: 116391
Potassium: 3.8
Sodium: 139
TCO2: 23
pCO2, Ven: 37.4 — ABNORMAL LOW
pH, Ven: 7.38 — ABNORMAL HIGH

## 2011-08-25 ENCOUNTER — Telehealth: Payer: Self-pay | Admitting: Cardiology

## 2011-08-25 ENCOUNTER — Ambulatory Visit (HOSPITAL_COMMUNITY): Payer: PRIVATE HEALTH INSURANCE | Attending: Cardiovascular Disease | Admitting: Radiology

## 2011-08-25 DIAGNOSIS — I2510 Atherosclerotic heart disease of native coronary artery without angina pectoris: Secondary | ICD-10-CM

## 2011-08-25 DIAGNOSIS — Z8249 Family history of ischemic heart disease and other diseases of the circulatory system: Secondary | ICD-10-CM | POA: Insufficient documentation

## 2011-08-25 DIAGNOSIS — D500 Iron deficiency anemia secondary to blood loss (chronic): Secondary | ICD-10-CM | POA: Insufficient documentation

## 2011-08-25 DIAGNOSIS — R0602 Shortness of breath: Secondary | ICD-10-CM

## 2011-08-25 DIAGNOSIS — R079 Chest pain, unspecified: Secondary | ICD-10-CM

## 2011-08-25 DIAGNOSIS — I10 Essential (primary) hypertension: Secondary | ICD-10-CM | POA: Insufficient documentation

## 2011-08-25 DIAGNOSIS — E785 Hyperlipidemia, unspecified: Secondary | ICD-10-CM | POA: Insufficient documentation

## 2011-08-25 MED ORDER — TECHNETIUM TC 99M TETROFOSMIN IV KIT
33.0000 | PACK | Freq: Once | INTRAVENOUS | Status: AC | PRN
  Administered 2011-08-25: 33 via INTRAVENOUS

## 2011-08-25 MED ORDER — REGADENOSON 0.4 MG/5ML IV SOLN
0.4000 mg | Freq: Once | INTRAVENOUS | Status: AC
  Administered 2011-08-25: 0.4 mg via INTRAVENOUS

## 2011-08-25 NOTE — Telephone Encounter (Signed)
I talked with pt. Pt states has been having increasing chest pain for the last couple of weeks. She has chewed aspirin or used NTG occ in the last 2 weeks with relief because of the chest discomfort. 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 She developed a pinching feeling in her chest. She took 2 NTG with relief. She has been home resting since this happened. Pt states she has numbness in 2 fingers this morning but denies chest pinching or tightness. I will review with Dr Shirlee Latch.

## 2011-08-25 NOTE — Telephone Encounter (Signed)
New msg Pt called and said she has had chest tightness and numbness in her arm yesterday and she took nitro and pain went away. She said stress seems to make symptoms worse. She said she doesn't have symptoms today.  Please call her back

## 2011-08-25 NOTE — Progress Notes (Signed)
[facility] SITE 3 NUCLEAR MED [address] [place] [address] [phone]  Cardiology Nuclear Med Study  Jacqueline Ferrell is a 46 y.o. female [id number] 06/17/1966   Nuclear Med Background Indication for Stress Test:  Evaluation for Ischemia and Stent Patency History:  3/12- Cath with Stent-LAD; 6/12- MPS- Mild anteroapical thinning, EF= 54% Cardiac Risk Factors: Family History - CAD, Hypertension, Lipids and Obesity  Symptoms:  Chest Pain, Chest Tightness with Exertion (last date of chest discomfort last night), Fatigue, Fatigue with Exertion, Nausea and Palpitations   Nuclear Pre-Procedure Caffeine/Decaff Intake:  None NPO After: 8:30am   Lungs:  Clear IV 0.9% NS with Angio Cath:  22g  IV Site: R Forearm x 1, tolerated well IV Started by:  Stanton Kidney, EMT-P  Chest Size (in):  46 Cup Size: C  Height: 5\' 7"  (1.702 m)  Weight:  326 lb (147.873 kg)  BMI:  Body mass index is 51.06 kg/(m^2). Tech Comments:  Held metoprolol x 6 hrs    Nuclear Med Study 1 or 2 day study: 2 day  Stress Test Type:  Lexiscan  Reading MD: J.Becket Wecker,M.D. Order Authorizing Provider:  Marca Ancona, MD  Resting Radionuclide: Technetium 21m Tetrofosmin  Resting Radionuclide Dose: 33.0 mCi  On    08-26-11  Stress Radionuclide:  Technetium 1m Tetrofosmin  Stress Radionuclide Dose: 33.0 mCi   On     08-25-11          Stress Protocol Rest HR: 67 Stress HR: 90  Rest BP: 165/108 automatic cuff, 170/110 manual Stress BP: 143/85  Exercise Time (min): n/a METS: n/a   Predicted Max HR: 176 bpm % Max HR: 51.14 bpm Rate Pressure Product: 24401   Dose of Adenosine (mg):  n/a Dose of Lexiscan: 0.4 mg  Dose of Atropine (mg): n/a Dose of Dobutamine: n/a mcg/kg/min (at max HR)  Stress Test Technologist: Irean Hong, RN  Nuclear Technologist:  Doyne Keel, CNMT     Rest Procedure:  Myocardial perfusion imaging was performed at rest 45 minutes following the intravenous administration of  Technetium 5m Tetrofosmin. Rest ECG: NSR and sinus arrhythmia  Stress Procedure:  The patient received IV Lexiscan 0.4 mg over 15-seconds.  Technetium 40m Tetrofosmin injected at 30-seconds.  There were no significant changes with Lexiscan. There was no chest pain. Quantitative spect images were obtained after a 45 minute delay. Stress ECG: No significant change from baseline ECG  QPS Raw Data Images:  Patient motion noted; appropriate software correction applied. Stress Images:  Motion correction protocol was used. Despite this the pictures are noisy. However there are no definite abnormalities. Rest Images:  The rest images are similar to the stress images. Subtraction (SDS):  No evidence of ischemia. Transient Ischemic Dilatation (Normal <1.22):  0.92 Lung/Heart Ratio (Normal <0.45):  0.24  Quantitative Gated Spect Images QGS EDV:  86 ml QGS ESV:  31 ml QGS cine images:  Normal Wall Motion QGS EF: 64%  Impression Exercise Capacity:  Lexiscan with no exercise. BP Response:  Normal blood pressure response. Clinical Symptoms:  Shortness of breath ECG Impression:  No significant ST segment change suggestive of ischemia. Comparison with Prior Nuclear Study: No significant change from previous study  Overall Impression:  The study reveals no significant abnormalities. There is no significant change from the report of the study in [place] dated June, 2012. There is no scar or ischemia. There is normal wall motion.  Jacqueline Rough, MD

## 2011-08-25 NOTE — Telephone Encounter (Signed)
Reviewed with Dr Shirlee Latch. He recommended pt have stress myoview. Pt will come to office today at 11:30 for stress myoview.

## 2011-08-26 ENCOUNTER — Ambulatory Visit (HOSPITAL_COMMUNITY): Payer: PRIVATE HEALTH INSURANCE | Attending: Cardiology | Admitting: Radiology

## 2011-08-26 DIAGNOSIS — R0989 Other specified symptoms and signs involving the circulatory and respiratory systems: Secondary | ICD-10-CM

## 2011-08-26 MED ORDER — TECHNETIUM TC 99M TETROFOSMIN IV KIT
33.0000 | PACK | Freq: Once | INTRAVENOUS | Status: AC | PRN
  Administered 2011-08-26: 33 via INTRAVENOUS

## 2011-10-18 ENCOUNTER — Other Ambulatory Visit: Payer: Self-pay | Admitting: Cardiology

## 2011-11-05 ENCOUNTER — Other Ambulatory Visit: Payer: Self-pay | Admitting: Cardiology

## 2011-11-05 MED ORDER — ATORVASTATIN CALCIUM 80 MG PO TABS: 80.0000 mg | ORAL_TABLET | Freq: Every day | ORAL | Status: DC

## 2011-11-05 MED ORDER — LOSARTAN POTASSIUM-HCTZ 50-12.5 MG PO TABS: 1.0000 | ORAL_TABLET | Freq: Every day | ORAL | Status: DC

## 2011-11-05 MED ORDER — EZETIMIBE 10 MG PO TABS: 10.0000 mg | ORAL_TABLET | Freq: Every day | ORAL | Status: DC

## 2011-11-05 MED ORDER — METOPROLOL SUCCINATE ER 25 MG PO TB24: 25.0000 mg | ORAL_TABLET | Freq: Every day | ORAL | Status: DC

## 2011-11-05 NOTE — Telephone Encounter (Signed)
Pt needs refill

## 2011-11-11 ENCOUNTER — Other Ambulatory Visit: Payer: Self-pay | Admitting: Cardiology

## 2011-11-11 MED ORDER — PRASUGREL HCL 10 MG PO TABS: 10.0000 mg | ORAL_TABLET | Freq: Every day | ORAL | Status: DC

## 2011-12-22 ENCOUNTER — Encounter: Payer: Self-pay | Admitting: *Deleted

## 2011-12-22 ENCOUNTER — Encounter: Payer: Self-pay | Admitting: Cardiology

## 2011-12-22 ENCOUNTER — Ambulatory Visit (INDEPENDENT_AMBULATORY_CARE_PROVIDER_SITE_OTHER): Payer: PRIVATE HEALTH INSURANCE | Admitting: Cardiology

## 2011-12-22 VITALS — BP 162/92 | HR 73 | Ht 67.0 in | Wt 333.0 lb

## 2011-12-22 DIAGNOSIS — G4733 Obstructive sleep apnea (adult) (pediatric): Secondary | ICD-10-CM

## 2011-12-22 DIAGNOSIS — I2510 Atherosclerotic heart disease of native coronary artery without angina pectoris: Secondary | ICD-10-CM

## 2011-12-22 DIAGNOSIS — R0609 Other forms of dyspnea: Secondary | ICD-10-CM

## 2011-12-22 DIAGNOSIS — E669 Obesity, unspecified: Secondary | ICD-10-CM

## 2011-12-22 DIAGNOSIS — I10 Essential (primary) hypertension: Secondary | ICD-10-CM

## 2011-12-22 DIAGNOSIS — R0989 Other specified symptoms and signs involving the circulatory and respiratory systems: Secondary | ICD-10-CM

## 2011-12-22 DIAGNOSIS — E785 Hyperlipidemia, unspecified: Secondary | ICD-10-CM

## 2011-12-22 DIAGNOSIS — R0683 Snoring: Secondary | ICD-10-CM

## 2011-12-22 MED ORDER — LOSARTAN POTASSIUM-HCTZ 100-25 MG PO TABS: 1.0000 | ORAL_TABLET | Freq: Every day | ORAL | Status: DC

## 2011-12-22 NOTE — Patient Instructions (Addendum)
Increase losartan/HCT to 100/25 daily.  Stop Effient.  Your physician recommends that you return for a FASTING lipid profile /BMET/HGB A1C in 1 week.22  Take and record your blood pressure daily. I will call you in about 2 weeks to get the readings. Luana Shu [phone]  Your physician has recommended that you have a sleep study. This test records several body functions during sleep, including: brain activity, eye movement, oxygen and carbon dioxide blood levels, heart rate and rhythm, breathing rate and rhythm, the flow of air through your mouth and nose, snoring, body muscle movements, and chest and belly movement.  You have been referred to [facility] office primary care.  Your physician recommends that you schedule a follow-up appointment in: 3 months with Dr Shirlee Latch.

## 2011-12-24 DIAGNOSIS — E669 Obesity, unspecified: Secondary | ICD-10-CM | POA: Insufficient documentation

## 2011-12-24 DIAGNOSIS — G4733 Obstructive sleep apnea (adult) (pediatric): Secondary | ICD-10-CM | POA: Insufficient documentation

## 2011-12-24 NOTE — Assessment & Plan Note (Signed)
History/body habitus concerning for OSA.  Will get sleep study.

## 2011-12-24 NOTE — Assessment & Plan Note (Signed)
Check fasting lipids, goal LDL < 70.  

## 2011-12-24 NOTE — Assessment & Plan Note (Signed)
Recent normal myoview, no exertional chest pain.  Continue ASA 81, statin, ARB, Toprol.  She can stop Effient at this point.

## 2011-12-24 NOTE — Assessment & Plan Note (Signed)
BP running high.  Will increase losartan/HCTZ to 100/25.  BP check in 2 wks.  BMET in 1 week.

## 2011-12-24 NOTE — Assessment & Plan Note (Signed)
This is a major problem.  BMI is 52.  Weight loss will help with BP control.  I am also concerned about diabetes.  She does not have a PCP and has not been monitored for DM.  I strongly encouraged dieting and more exercise.  I will check fasting glucose and hemoglobin A1c.  She is moving to [facility] so I will refer her to a PCP at the [facility] office.

## 2011-12-24 NOTE — Progress Notes (Signed)
Patient ID: Jacqueline Ferrell, female   DOB: 1966-02-07, 46 y.o.   MRN: [id number] 46 yo with history of HTN , hyperlipidemia, and CAD presents for followup after LAD PCI.  Patient initially presented with progressive exertional chest pain and had an abnormal myoview.  Left heart cath showed 80% proximal LAD stenosis, and she recieved a Promus drug-eluting stent to the proximal LAD in 3/12.  She had a Lexiscan myvoiew in 2/13 with no ischemia or infarction.    Patient continues to have occasional episodes of atypical chest pain with emotion stress.  This tends to happen when things are busy at work, maybe once or twice in a two month span.  No exertional chest pain.  No exertional dyspnea but she is not very active.  She has gained 19 lbs since I last saw her.   BP has been running high.  She does not have a PCP at this time.  She snore loudly at night and has fatigue/daytime sleepiness.   Labs (1/12): LDL 49, HDL 57, TSH normal, K 4.3, creatinine 1.3 Labs (3/12): creatinine 0.81 Labs (6/12): LDL 149 Labs (8/12): LDL 98, HDL 32  ECG: NSR, poor anterior R wave progression  Allergies (verified):  1)  ! Lisinopril  Family History: HTN - multiple family members Hyperlipidemia Father with MI in his early 14s, had CABG in his 20s Multiple first cousins (5) with MIs in their 77s.   Siblings without known CAD  Social History: Separated. 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    Past Medical History: 1. HTN 2. Nephrolithiasis 3. Poor medical compliance 4. Hyperlipidemia 5. Obese 6. CAD: Progressive exertional chest pain in 1/12.  Lexiscan myoview in 1/12 with EF 52% and a small reversible perfusion defect in diagonal territory either due to ischemia or shifting breast attenuation.  Echo (1/12): EF 50-55%, mild LAE, mild LVH, no significant valvular abnormalities.   LHC  (3/12) showed 80% proximal LAD stenosis with EF 60%.  Patient had Promus DES to proximal LAD.  ETT-myoview (6/12) with recurrent chest pain: EF 54%, no evidence for ischemia or infarction. Lexiscan myoview in 2/13 with EF 64%, no ischemia or infarction.   Current Outpatient Prescriptions  Medication Sig Dispense Refill  . ALPRAZolam (XANAX) 0.25 MG tablet Take 0.25 mg by mouth at bedtime as needed.        Marland Kitchen aspirin 81 MG EC tablet Take 81 mg by mouth daily.        Marland Kitchen atorvastatin (LIPITOR) 80 MG tablet Take 1 tablet (80 mg total) by mouth daily.  30 tablet  6  . Calcium Carbonate (CALTRATE 600) 1500 MG TABS Take 1 tablet by mouth daily.        . Cholecalciferol (VITAMIN D3) 1000 UNITS tablet Take 1,000 Units by mouth daily.        . citalopram (CELEXA) 10 MG tablet Take 10 mg by mouth daily.        Marland Kitchen ezetimibe (ZETIA) 10 MG tablet Take 1 tablet (10 mg total) by mouth daily.  30 tablet  6  . HYDROcodone-acetaminophen (VICODIN) 5-500 MG per tablet Take 1 tablet by mouth every 6 (six) hours as needed.        . methocarbamol (ROBAXIN) 750 MG tablet Take 750 mg by mouth as needed.        . metoprolol succinate (TOPROL-XL) 25 MG 24 hr tablet  Take 1 tablet (25 mg total) by mouth daily.  30 tablet  6  . Multiple Vitamin (MULTIVITAMIN) tablet Take 1 tablet by mouth daily.        . Omega-3 350 MG CAPS Take 1 capsule by mouth daily.        . traMADol (ULTRAM) 50 MG tablet Take 50 mg by mouth every 6 (six) hours as needed.        Marland Kitchen losartan-hydrochlorothiazide (HYZAAR) 100-25 MG per tablet Take 1 tablet by mouth daily.  30 tablet  6    BP 162/92  Pulse 73  Ht 5\' 7"  (1.702 m)  Wt 151.048 kg (333 lb)  BMI 52.16 kg/m2 General:  Well developed, well nourished, in no acute distress. Obese.  Neck:  Neck supple, no JVD. No masses, thyromegaly or abnormal cervical nodes. Lungs:  Clear bilaterally to auscultation and percussion. Heart:  Non-displaced PMI, chest non-tender; regular rate and rhythm, S1, S2  without rubs or gallops. 1/6 SEM RUSB.  Carotid upstroke normal, no bruit.  Pedals normal pulses. 1+ ankle edema, no varicosities. Abdomen:  Bowel sounds positive; abdomen soft and non-tender without masses, organomegaly, or hernias noted. No hepatosplenomegaly. Extremities:  No clubbing or cyanosis. Neurologic:  Alert and oriented x 3. Psych:  Normal affect.

## 2011-12-29 ENCOUNTER — Other Ambulatory Visit (INDEPENDENT_AMBULATORY_CARE_PROVIDER_SITE_OTHER): Payer: PRIVATE HEALTH INSURANCE

## 2011-12-29 ENCOUNTER — Other Ambulatory Visit: Payer: Self-pay | Admitting: *Deleted

## 2011-12-29 DIAGNOSIS — I10 Essential (primary) hypertension: Secondary | ICD-10-CM

## 2011-12-29 DIAGNOSIS — I2510 Atherosclerotic heart disease of native coronary artery without angina pectoris: Secondary | ICD-10-CM

## 2011-12-29 LAB — LIPID PANEL
Cholesterol: 133 mg/dL (ref 0–200)
LDL Cholesterol: 88 mg/dL (ref 0–99)
Triglycerides: 53 mg/dL (ref 0.0–149.0)
VLDL: 10.6 mg/dL (ref 0.0–40.0)

## 2011-12-29 LAB — BASIC METABOLIC PANEL
Chloride: 105 mEq/L (ref 96–112)
Potassium: 3.3 mEq/L — ABNORMAL LOW (ref 3.5–5.1)
Sodium: 139 mEq/L (ref 135–145)

## 2011-12-29 LAB — HEMOGLOBIN A1C: Hgb A1c MFr Bld: 6.2 % (ref 4.6–6.5)

## 2011-12-29 MED ORDER — POTASSIUM CHLORIDE CRYS ER 20 MEQ PO TBCR: 20.0000 meq | EXTENDED_RELEASE_TABLET | Freq: Every day | ORAL | Status: DC

## 2012-01-03 ENCOUNTER — Telehealth: Payer: Self-pay | Admitting: *Deleted

## 2012-01-03 NOTE — Telephone Encounter (Signed)
Seem to be trending better...continue same meds for now

## 2012-01-03 NOTE — Telephone Encounter (Signed)
Pt.notified

## 2012-01-03 NOTE — Telephone Encounter (Signed)
Recent BP readings. 12/23/11 160/90   12/24/11 145/85   12/25/11 130/90   12/26/11 137/75   12/27/11 111/55   12/28/11 113/75   12/29/11 100/51  12/30/11 110/70   12/31/11 119/62.  I will forward to Dr Shirlee Latch for review and recommendations.

## 2012-01-18 ENCOUNTER — Encounter (HOSPITAL_BASED_OUTPATIENT_CLINIC_OR_DEPARTMENT_OTHER): Payer: PRIVATE HEALTH INSURANCE

## 2012-01-27 ENCOUNTER — Other Ambulatory Visit: Payer: Self-pay | Admitting: *Deleted

## 2012-01-27 ENCOUNTER — Encounter: Payer: Self-pay | Admitting: Internal Medicine

## 2012-01-27 ENCOUNTER — Ambulatory Visit (INDEPENDENT_AMBULATORY_CARE_PROVIDER_SITE_OTHER): Payer: PRIVATE HEALTH INSURANCE | Admitting: Internal Medicine

## 2012-01-27 ENCOUNTER — Ambulatory Visit: Payer: PRIVATE HEALTH INSURANCE

## 2012-01-27 VITALS — BP 138/80 | HR 65 | Temp 98.3°F | Resp 16 | Ht 67.0 in | Wt 325.0 lb

## 2012-01-27 DIAGNOSIS — B351 Tinea unguium: Secondary | ICD-10-CM

## 2012-01-27 DIAGNOSIS — M179 Osteoarthritis of knee, unspecified: Secondary | ICD-10-CM

## 2012-01-27 DIAGNOSIS — E876 Hypokalemia: Secondary | ICD-10-CM

## 2012-01-27 DIAGNOSIS — IMO0002 Reserved for concepts with insufficient information to code with codable children: Secondary | ICD-10-CM

## 2012-01-27 DIAGNOSIS — F341 Dysthymic disorder: Secondary | ICD-10-CM

## 2012-01-27 DIAGNOSIS — F418 Other specified anxiety disorders: Secondary | ICD-10-CM

## 2012-01-27 DIAGNOSIS — M1710 Unilateral primary osteoarthritis, unspecified knee: Secondary | ICD-10-CM

## 2012-01-27 DIAGNOSIS — I10 Essential (primary) hypertension: Secondary | ICD-10-CM

## 2012-01-27 LAB — COMPREHENSIVE METABOLIC PANEL
CO2: 26 mEq/L (ref 19–32)
Creatinine, Ser: 0.8 mg/dL (ref 0.4–1.2)
GFR: 98.37 mL/min (ref >60.00)
Glucose, Bld: 99 mg/dL (ref 70–99)
Sodium: 139 mEq/L (ref 135–145)
Total Bilirubin: 0.5 mg/dL (ref 0.3–1.2)
Total Protein: 7.2 g/dL (ref 6.0–8.3)

## 2012-01-27 MED ORDER — TERBINAFINE HCL 250 MG PO TABS: 250.0000 mg | ORAL_TABLET | Freq: Every day | ORAL | Status: DC

## 2012-01-27 MED ORDER — POTASSIUM CHLORIDE CRYS ER 20 MEQ PO TBCR: 20.0000 meq | EXTENDED_RELEASE_TABLET | Freq: Two times a day (BID) | ORAL | Status: DC

## 2012-01-27 MED ORDER — HYDROCODONE-ACETAMINOPHEN 5-500 MG PO TABS: 1.0000 | ORAL_TABLET | Freq: Four times a day (QID) | ORAL | Status: DC | PRN

## 2012-01-27 MED ORDER — ALPRAZOLAM 0.25 MG PO TABS: 0.2500 mg | ORAL_TABLET | Freq: Three times a day (TID) | ORAL | Status: DC | PRN

## 2012-01-27 MED ORDER — CITALOPRAM HYDROBROMIDE 10 MG PO TABS: 10.0000 mg | ORAL_TABLET | Freq: Every day | ORAL | Status: DC

## 2012-01-27 NOTE — Progress Notes (Signed)
Subjective:    Patient ID: Jacqueline Ferrell, female    DOB: Mar 06, 1966, 46 y.o.   MRN: [id number]  Hypertension This is a chronic problem. The current episode started more than 1 year ago. The problem has been gradually improving since onset. The problem is controlled. Associated symptoms include malaise/fatigue. Pertinent negatives include no anxiety, blurred vision, chest pain, headaches, neck pain, orthopnea, palpitations, peripheral edema, PND, shortness of breath or sweats. Past treatments include diuretics, angiotensin blockers and beta blockers. The current treatment provides significant improvement. Compliance problems include exercise and diet.  Hypertensive end-organ damage includes CAD/MI. Identifiable causes of hypertension include sleep apnea.      Review of Systems  Constitutional: Positive for malaise/fatigue. Negative for fever, chills, diaphoresis, activity change, appetite change, fatigue and unexpected weight change.  HENT: Negative.  Negative for neck pain.   Eyes: Negative.  Negative for blurred vision.  Respiratory: Negative for cough, chest tightness, shortness of breath, wheezing and stridor.   Cardiovascular: Negative for chest pain, palpitations, orthopnea, leg swelling and PND.  Gastrointestinal: Negative for nausea, vomiting, abdominal pain, diarrhea, constipation and abdominal distention.  Genitourinary: Negative.   Musculoskeletal: Positive for arthralgias (knee pain B). Negative for myalgias, back pain, joint swelling and gait problem.  Skin: Negative for color change, pallor, rash and wound.       Her toenails are painful and thick  Neurological: Negative for dizziness, tremors, seizures, syncope, facial asymmetry, speech difficulty, weakness, light-headedness, numbness and headaches.  Hematological: Negative for adenopathy. Does not bruise/bleed easily.  Psychiatric/Behavioral: Positive for dysphoric mood. Negative for suicidal ideas, hallucinations, behavioral  problems, confusion, disturbed wake/sleep cycle, self-injury, decreased concentration and agitation. The patient is nervous/anxious. The patient is not hyperactive.        Objective:   Physical Exam  Vitals reviewed. Constitutional: She is oriented to person, place, and time. She appears well-developed and well-nourished. No distress.  HENT:  Head: Normocephalic and atraumatic.  Mouth/Throat: Oropharynx is clear and moist. No oropharyngeal exudate.  Eyes: Conjunctivae are normal. Right eye exhibits no discharge. Left eye exhibits no discharge. No scleral icterus.  Neck: Normal range of motion. Neck supple. No JVD present. No tracheal deviation present. No thyromegaly present.  Cardiovascular: Normal rate, regular rhythm, normal heart sounds and intact distal pulses.  Exam reveals no gallop and no friction rub.   No murmur heard. Pulmonary/Chest: Effort normal and breath sounds normal. No stridor. No respiratory distress. She has no wheezes. She has no rales. She exhibits no tenderness.  Abdominal: Soft. Bowel sounds are normal. She exhibits no distension and no mass. There is no tenderness. There is no rebound and no guarding.  Musculoskeletal: Normal range of motion. She exhibits no edema and no tenderness.  Lymphadenopathy:    She has no cervical adenopathy.  Neurological: She is oriented to person, place, and time.  Skin: Skin is warm and dry. No rash noted. She is not diaphoretic. No erythema. No pallor.       7/10 toenails show thickening of the nails with lysis and subungual debris, there is no erythema or exudate  Psychiatric: She has a normal mood and affect. Her speech is normal and behavior is normal. Judgment and thought content normal. Her mood appears not anxious. Her affect is not angry, not blunt, not labile and not inappropriate. Cognition and memory are normal. She does not exhibit a depressed mood.     Lab Results  Component Value Date   WBC 6.1 09/17/2010   HGB 12.9  09/17/2010   HCT 38.7 09/17/2010   PLT 204 09/17/2010   GLUCOSE 93 12/29/2011   CHOL 133 12/29/2011   TRIG 53.0 12/29/2011   HDL 34.30* 12/29/2011   LDLCALC 88 12/29/2011   ALT 19 02/16/2011   AST 20 02/16/2011   NA 139 12/29/2011   K 3.3* 12/29/2011   CL 105 12/29/2011   CREATININE 0.8 12/29/2011   BUN 16 12/29/2011   CO2 24 12/29/2011   TSH 2.183 07/28/2010   INR 1.1* 09/11/2010   HGBA1C 6.2 12/29/2011       Assessment & Plan:

## 2012-01-27 NOTE — Patient Instructions (Signed)
Ringworm, Nail A fungal infection of the nail (tinea unguium/onychomycosis) is common. It is common as the visible part of the nail is composed of dead cells which have no blood supply to help prevent infection. It occurs because fungi are everywhere and will pick any opportunity to grow on any dead material. Because nails are very slow growing they require up to 2 years of treatment with anti-fungal medications. The entire nail back to the base is infected. This includes approximately ? of the nail which you cannot see. If your caregiver has prescribed a medication by mouth, take it every day and as directed. No progress will be seen for at least 6 to 9 months. Do not be disappointed! Because fungi live on dead cells with little or no exposure to blood supply, medication delivery to the infection is slow; thus the cure is slow. It is also why you can observe no progress in the first 6 months. The nail becoming cured is the base of the nail, as it has the blood supply. Topical medication such as creams and ointments are usually not effective. Important in successful treatment of nail fungus is closely following the medication regimen that your doctor prescribes. Sometimes you and your caregiver may elect to speed up this process by surgical removal of all the nails. Even this may still require 6 to 9 months of additional oral medications. See your caregiver as directed. Remember there will be no visible improvement for at least 6 months. See your caregiver sooner if other signs of infection (redness and swelling) develop. Document Released: 06/25/2000 Document Revised: 06/17/2011 Document Reviewed: 09/03/2008 [facility] Patient Information 2012 [place], [place].Hypokalemia Hypokalemia means a low potassium level in the blood.Potassium is an electrolyte that helps regulate the amount of fluid in the body. It also stimulates muscle contraction and maintains a stable acid-base balance.Most of the body's  potassium is inside of cells, and only a very small amount is in the blood. Because the amount in the blood is so small, minor changes can have big effects. PREPARATION FOR TEST Testing for potassium requires taking a blood sample taken by needle from a vein in the arm. The skin is cleaned thoroughly before the sample is drawn. There is no other special preparation needed. NORMAL VALUES Potassium levels below 3.5 mEq/L are abnormally low. Levels above 5.1 mEq/L are abnormally high. Ranges for normal findings may vary among different laboratories and hospitals. You should always check with your doctor after having lab work or other tests done to discuss the meaning of your test results and whether your values are considered within normal limits. MEANING OF TEST  Your caregiver will go over the test results with you and discuss the importance and meaning of your results, as well as treatment options and the need for additional tests, if necessary. A potassium level is frequently part of a routine medical exam. It is usually included as part of a whole "panel" of tests for several blood salts (such as Sodium and Chloride). It may be done as part of follow-up when a low potassium level was found in the past or other blood salts are suspected of being out of balance. A low potassium level might be suspected if you have one or more of the following:  Symptoms of weakness.   Abnormal heart rhythms.   High blood pressure and are taking medication to control this, especially water pills (diuretics).   Kidney disease that can affect your potassium level .   Diabetes requiring  the use of insulin. The potassium may fall after taking insulin, especially if the diabetes had been out of control for a while.   A condition requiring the use of cortisone-type medication or certain types of antibiotics.   Vomiting and/or diarrhea for more than a day or two.   A stomach or intestinal condition that may not  permit appropriate absorption of potassium.   Fainting episodes.   Mental confusion.  OBTAINING TEST RESULTS It is your responsibility to obtain your test results. Ask the lab or department performing the test when and how you will get your results.  Please contact your caregiver directly if you have not received the results within one week. At that time, ask if there is anything different or new you should be doing in relation to the results. TREATMENT Hypokalemia can be treated with potassium supplements taken by mouth and/or adjustments in your current medications. A diet high in potassium is also helpful. Foods with high potassium content are:  Peas, lentils, lima beans, nuts, and dried fruit.   Whole grain and bran cereals and breads.   Fresh fruit, vegetables (bananas, cantaloupe, grapefruit, oranges, tomatoes, honeydew melons, potatoes).   Orange and tomato juices.   Meats. If potassium supplement has been prescribed for you today or your medications have been adjusted, see your personal caregiver in time02 for a re-check.  SEEK MEDICAL CARE IF:  There is a feeling of worsening weakness.   You experience repeated chest palpitations.   You are diabetic and having difficulty keeping your blood sugars in the normal range.   You are experiencing vomiting and/or diarrhea.   You are having difficulty with any of your regular medications.  SEEK IMMEDIATE MEDICAL CARE IF:  You experience chest pain, shortness of breath, or episodes of dizziness.   You have been having vomiting or diarrhea for more than 2 days.   You have a fainting episode.  MAKE SURE YOU:   Understand these instructions.   Will watch your condition.   Will get help right away if you are not doing well or get worse.  Document Released: 06/28/2005 Document Revised: 06/17/2011 Document Reviewed: 06/08/2008 [facility] Patient Information 2012 [place], [place].

## 2012-01-30 ENCOUNTER — Encounter: Payer: Self-pay | Admitting: Internal Medicine

## 2012-01-30 NOTE — Assessment & Plan Note (Signed)
She will continue xanax and celexa

## 2012-01-30 NOTE — Assessment & Plan Note (Signed)
She will start lamisil po and will RTC in 3-4 weeks to check her LFT's

## 2012-01-30 NOTE — Assessment & Plan Note (Signed)
Her BP is well controlled, I will check her lytes and renal function 

## 2012-01-30 NOTE — Assessment & Plan Note (Signed)
She wants to continue vicodin as needed

## 2012-01-30 NOTE — Assessment & Plan Note (Signed)
I have asked her to increase her K+ dose to BID and I will recheck her K+ level today

## 2012-02-28 ENCOUNTER — Encounter: Payer: Self-pay | Admitting: Internal Medicine

## 2012-02-28 ENCOUNTER — Ambulatory Visit (INDEPENDENT_AMBULATORY_CARE_PROVIDER_SITE_OTHER): Payer: PRIVATE HEALTH INSURANCE | Admitting: Internal Medicine

## 2012-02-28 ENCOUNTER — Other Ambulatory Visit (INDEPENDENT_AMBULATORY_CARE_PROVIDER_SITE_OTHER): Payer: PRIVATE HEALTH INSURANCE

## 2012-02-28 VITALS — BP 132/82 | HR 65 | Temp 97.1°F | Resp 16 | Wt 328.5 lb

## 2012-02-28 DIAGNOSIS — I10 Essential (primary) hypertension: Secondary | ICD-10-CM

## 2012-02-28 DIAGNOSIS — B351 Tinea unguium: Secondary | ICD-10-CM

## 2012-02-28 DIAGNOSIS — E876 Hypokalemia: Secondary | ICD-10-CM

## 2012-02-28 LAB — COMPREHENSIVE METABOLIC PANEL
ALT: 19 U/L (ref 0–35)
Albumin: 3.8 g/dL (ref 3.5–5.2)
Alkaline Phosphatase: 52 U/L (ref 39–117)
CO2: 27 mEq/L (ref 19–32)
Glucose, Bld: 98 mg/dL (ref 70–99)
Potassium: 3 mEq/L — ABNORMAL LOW (ref 3.5–5.1)
Sodium: 140 mEq/L (ref 135–145)
Total Bilirubin: 0.5 mg/dL (ref 0.3–1.2)
Total Protein: 7.2 g/dL (ref 6.0–8.3)

## 2012-02-28 LAB — MAGNESIUM: Magnesium: 1.8 mg/dL (ref 1.5–2.5)

## 2012-02-28 MED ORDER — TERBINAFINE HCL 250 MG PO TABS: 250.0000 mg | ORAL_TABLET | Freq: Every day | ORAL | Status: AC

## 2012-02-28 MED ORDER — POTASSIUM CHLORIDE CRYS ER 20 MEQ PO TBCR: 20.0000 meq | EXTENDED_RELEASE_TABLET | Freq: Two times a day (BID) | ORAL | Status: DC

## 2012-02-28 NOTE — Progress Notes (Signed)
  Subjective:    Patient ID: Jacqueline Ferrell, female    DOB: Sep 30, 1965, 46 y.o.   MRN: [id number]  Hypertension This is a chronic problem. The current episode started more than 1 month ago. The problem has been gradually improving since onset. The problem is controlled. Pertinent negatives include no anxiety, blurred vision, chest pain, headaches, malaise/fatigue, neck pain, orthopnea, palpitations, peripheral edema, PND, shortness of breath or sweats. Past treatments include beta blockers, diuretics and angiotensin blockers. There are no compliance problems.  Hypertensive end-organ damage includes CAD/MI. Identifiable causes of hypertension include sleep apnea.      Review of Systems  Constitutional: Negative for fever, chills, malaise/fatigue, diaphoresis, activity change, appetite change, fatigue and unexpected weight change.  HENT: Negative.  Negative for neck pain.   Eyes: Negative.  Negative for blurred vision.  Respiratory: Negative for cough, chest tightness, shortness of breath, wheezing and stridor.   Cardiovascular: Negative for chest pain, palpitations, orthopnea and PND.  Gastrointestinal: Negative for nausea, abdominal pain, diarrhea and abdominal distention.  Genitourinary: Negative.   Musculoskeletal: Negative.   Skin: Negative.   Neurological: Negative.  Negative for headaches.  Hematological: Negative for adenopathy. Does not bruise/bleed easily.  Psychiatric/Behavioral: Negative.        Objective:   Physical Exam  Vitals reviewed. Constitutional: She is oriented to person, place, and time. She appears well-developed and well-nourished. No distress.  HENT:  Head: Normocephalic and atraumatic.  Mouth/Throat: Oropharynx is clear and moist. No oropharyngeal exudate.  Eyes: Conjunctivae are normal. Right eye exhibits no discharge. Left eye exhibits no discharge. No scleral icterus.  Neck: Normal range of motion. Neck supple. No JVD present. No tracheal deviation present.  No thyromegaly present.  Cardiovascular: Normal rate, regular rhythm, normal heart sounds and intact distal pulses.  Exam reveals no gallop.   No murmur heard. Pulmonary/Chest: Effort normal and breath sounds normal. No stridor. No respiratory distress. She has no wheezes. She has no rales. She exhibits no tenderness.  Abdominal: Soft. Bowel sounds are normal. She exhibits no distension and no mass. There is no tenderness. There is no rebound and no guarding.  Musculoskeletal: Normal range of motion. She exhibits no edema and no tenderness.  Lymphadenopathy:    She has no cervical adenopathy.  Neurological: She is oriented to person, place, and time.  Skin: Skin is warm and dry. No rash noted. She is not diaphoretic. No erythema. No pallor.  Psychiatric: She has a normal mood and affect. Her behavior is normal. Judgment and thought content normal.      Lab Results  Component Value Date   WBC 6.1 09/17/2010   HGB 12.9 09/17/2010   HCT 38.7 09/17/2010   PLT 204 09/17/2010   GLUCOSE 99 01/27/2012   CHOL 133 12/29/2011   TRIG 53.0 12/29/2011   HDL 34.30* 12/29/2011   LDLCALC 88 12/29/2011   ALT 22 01/27/2012   AST 20 01/27/2012   NA 139 01/27/2012   K 3.2* 01/27/2012   CL 105 01/27/2012   CREATININE 0.8 01/27/2012   BUN 19 01/27/2012   CO2 26 01/27/2012   TSH 2.183 07/28/2010   INR 1.1* 09/11/2010   HGBA1C 6.2 12/29/2011      Assessment & Plan:

## 2012-02-28 NOTE — Patient Instructions (Signed)

## 2012-02-28 NOTE — Assessment & Plan Note (Signed)
Her BP is well controlled 

## 2012-02-28 NOTE — Assessment & Plan Note (Signed)
She is doing well on terbinafine, I will check her for hepatotoxicity today

## 2012-02-28 NOTE — Assessment & Plan Note (Signed)
I will check her K+ and Mg++ levels today 

## 2012-03-21 ENCOUNTER — Ambulatory Visit: Payer: PRIVATE HEALTH INSURANCE | Admitting: Cardiology

## 2012-03-23 IMAGING — CR DG CHEST 1V PORT
1 series · 1 of 1 positions shown · non-contrast
Comparison: none

REASON FOR EXAM: Chest Pain
COMMENTS:

PROCEDURE:     DXR - DXR PORTABLE CHEST SINGLE VIEW  - December 18, 2010 [DATE]
RESULT:     The lung fields are clear. No pneumonia, pneumothorax or pleural
effusion is seen. Heart size is normal. Monitoring electrodes are present.

[view not recorded]
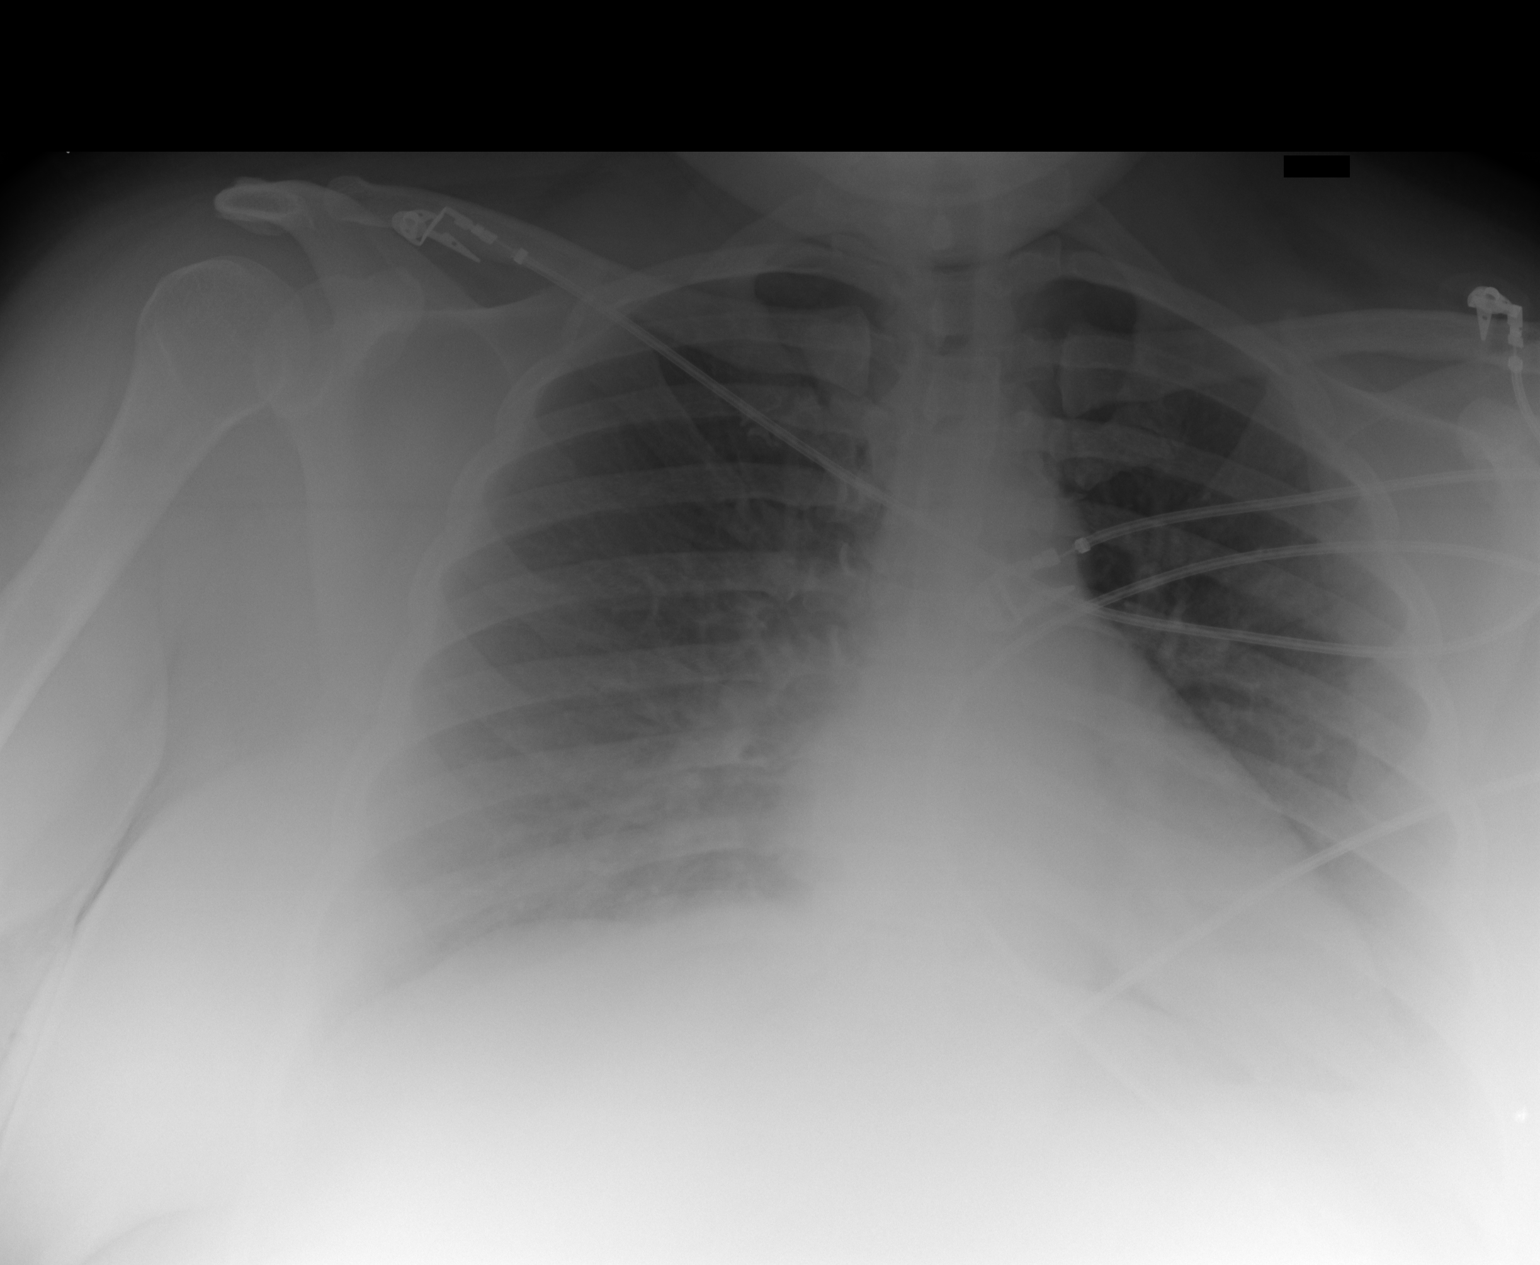

[1 of 1 positions shown; findings below may reference images not displayed]

IMPRESSION: 1.     No acute changes are identified.

## 2012-05-25 LAB — HM PAP SMEAR: HM PAP: NORMAL

## 2012-06-13 ENCOUNTER — Other Ambulatory Visit: Payer: Self-pay | Admitting: *Deleted

## 2012-06-13 MED ORDER — LOSARTAN POTASSIUM-HCTZ 100-25 MG PO TABS: 1.0000 | ORAL_TABLET | Freq: Every day | ORAL | Status: DC

## 2012-07-16 ENCOUNTER — Other Ambulatory Visit: Payer: Self-pay | Admitting: Cardiology

## 2013-02-16 ENCOUNTER — Encounter (HOSPITAL_COMMUNITY): Payer: Self-pay | Admitting: Emergency Medicine

## 2013-02-16 ENCOUNTER — Emergency Department (INDEPENDENT_AMBULATORY_CARE_PROVIDER_SITE_OTHER)
Admission: EM | Admit: 2013-02-16 | Discharge: 2013-02-16 | Disposition: A | Discharge status: Home / Self Care | Payer: Self-pay | Source: Home / Self Care

## 2013-02-16 DIAGNOSIS — L600 Ingrowing nail: Secondary | ICD-10-CM

## 2013-02-16 DIAGNOSIS — M1710 Unilateral primary osteoarthritis, unspecified knee: Secondary | ICD-10-CM

## 2013-02-16 MED ORDER — DOXYCYCLINE HYCLATE 100 MG PO CAPS: 100.0000 mg | ORAL_CAPSULE | Freq: Two times a day (BID) | ORAL | Status: DC

## 2013-02-16 MED ORDER — HYDROCODONE-ACETAMINOPHEN 5-500 MG PO TABS: 1.0000 | ORAL_TABLET | Freq: Four times a day (QID) | ORAL | Status: DC | PRN

## 2013-02-16 NOTE — ED Notes (Signed)
C/o ingrown toenail on left pinky toe.  States the toe is very sore.  At once she did have fungus in her toes which she did treat.   Patient states she put peroxide on her toe and it fuzz up so she feels as if she has an infection.

## 2013-02-16 NOTE — ED Provider Notes (Signed)
Jacqueline Ferrell is a 47 y.o. female who presents to Urgent Care today for left great ingrown toenail. Present off and on for a few months over worsened 2 days ago. Patient has tried some over-the-counter medications and peroxide soaks which is only been mildly helpful.  She has tried shaving the toenail back but it is too painful. The pain is moderate and worse with activity. No fevers or chills nausea vomiting or diarrhea.    PMH reviewed. Obesity and coronary artery disease History  Substance Use Topics  . Smoking status: Never Smoker   . Smokeless tobacco: Never Used  . Alcohol Use: No     Comment: occasional   ROS as above Medications reviewed. No current facility-administered medications for this encounter.   Current Outpatient Prescriptions  Medication Sig Dispense Refill  . ALPRAZolam (XANAX) 0.25 MG tablet Take 1 tablet (0.25 mg total) by mouth 3 (three) times daily as needed.  60 tablet  4  . aspirin 81 MG EC tablet Take 81 mg by mouth daily.        Marland Kitchen atorvastatin (LIPITOR) 80 MG tablet Take 1 tablet (80 mg total) by mouth daily.  30 tablet  6  . Calcium Carbonate (CALTRATE 600) 1500 MG TABS Take 1 tablet by mouth daily.        . Cholecalciferol (VITAMIN D3) 1000 UNITS tablet Take 1,000 Units by mouth daily.        . citalopram (CELEXA) 10 MG tablet Take 1 tablet (10 mg total) by mouth daily.  90 tablet  3  . doxycycline (VIBRAMYCIN) 100 MG capsule Take 1 capsule (100 mg total) by mouth 2 (two) times daily.  14 capsule  0  . HYDROcodone-acetaminophen (VICODIN) 5-500 MG per tablet Take 1 tablet by mouth every 6 (six) hours as needed.  10 tablet  0  . losartan-hydrochlorothiazide (HYZAAR) 100-25 MG per tablet Take 1 tablet by mouth daily.  90 tablet  1  . methocarbamol (ROBAXIN) 750 MG tablet Take 750 mg by mouth as needed.        . metoprolol succinate (TOPROL-XL) 25 MG 24 hr tablet TAKE 1 TABLET BY MOUTH EVERY DAY  90 tablet  0  . Multiple Vitamin (MULTIVITAMIN) tablet Take 1  tablet by mouth daily.        . Omega-3 350 MG CAPS Take 1 capsule by mouth daily.        . potassium chloride SA (K-DUR,KLOR-CON) 20 MEQ tablet Take 1 tablet (20 mEq total) by mouth 2 (two) times daily.  60 tablet  4  . terbinafine (LAMISIL) 250 MG tablet Take 1 tablet (250 mg total) by mouth daily.  30 tablet  2  . ZETIA 10 MG tablet TAKE 1 TABLET BY MOUTH EVERY DAY  90 tablet  0    Exam:  BP 166/76  Pulse 73  Temp(Src) 98 F (36.7 C) (Oral)  Resp 16  SpO2 100%  LMP 02/10/2008 Gen: Well NAD LEFT GREAT TOENAIL: Medial corner mildly ingrown with the surrounding medial tissue mildly erythematous and tender. Capillary refill is intact.   No results found for this or any previous visit (from the past 24 hour(s)). No results found.  Assessment and Plan: 47 y.o. female with mildly ingrown toenail.  Referred patient to followup with podiatry for removal. Hydrocodone for pain and doxycycline antibiotics.  Handout provided.  Discussed warning signs or symptoms. Please see discharge instructions. Patient expresses understanding.      Rodolph Bong, MD 02/16/13 1004

## 2013-09-13 ENCOUNTER — Ambulatory Visit (INDEPENDENT_AMBULATORY_CARE_PROVIDER_SITE_OTHER)
Admission: RE | Admit: 2013-09-13 | Discharge: 2013-09-13 | Disposition: A | Discharge status: Home / Self Care | Payer: Managed Care, Other (non HMO) | Source: Ambulatory Visit | Attending: Internal Medicine | Admitting: Internal Medicine

## 2013-09-13 ENCOUNTER — Other Ambulatory Visit (INDEPENDENT_AMBULATORY_CARE_PROVIDER_SITE_OTHER): Payer: Managed Care, Other (non HMO)

## 2013-09-13 ENCOUNTER — Encounter: Payer: Self-pay | Admitting: Internal Medicine

## 2013-09-13 ENCOUNTER — Ambulatory Visit (INDEPENDENT_AMBULATORY_CARE_PROVIDER_SITE_OTHER): Payer: Managed Care, Other (non HMO) | Admitting: Internal Medicine

## 2013-09-13 VITALS — BP 118/82 | HR 75 | Temp 98.5°F | Resp 16 | Ht 67.5 in | Wt 333.4 lb

## 2013-09-13 DIAGNOSIS — I2510 Atherosclerotic heart disease of native coronary artery without angina pectoris: Secondary | ICD-10-CM

## 2013-09-13 DIAGNOSIS — E785 Hyperlipidemia, unspecified: Secondary | ICD-10-CM

## 2013-09-13 DIAGNOSIS — R109 Unspecified abdominal pain: Secondary | ICD-10-CM

## 2013-09-13 DIAGNOSIS — I10 Essential (primary) hypertension: Secondary | ICD-10-CM

## 2013-09-13 DIAGNOSIS — Z0000 Encounter for general adult medical examination without abnormal findings: Secondary | ICD-10-CM | POA: Insufficient documentation

## 2013-09-13 DIAGNOSIS — K921 Melena: Secondary | ICD-10-CM

## 2013-09-13 DIAGNOSIS — Z1231 Encounter for screening mammogram for malignant neoplasm of breast: Secondary | ICD-10-CM | POA: Insufficient documentation

## 2013-09-13 LAB — COMPREHENSIVE METABOLIC PANEL
ALT: 22 U/L (ref 0–35)
AST: 17 U/L (ref 0–37)
Albumin: 3.9 g/dL (ref 3.5–5.2)
Alkaline Phosphatase: 49 U/L (ref 39–117)
BUN: 22 mg/dL (ref 6–23)
CALCIUM: 9.4 mg/dL (ref 8.4–10.5)
CO2: 23 meq/L (ref 19–32)
Chloride: 104 mEq/L (ref 96–112)
Creatinine, Ser: 0.8 mg/dL (ref 0.4–1.2)
GFR: 102.01 mL/min (ref >60.00)
Glucose, Bld: 79 mg/dL (ref 70–99)
Potassium: 3.5 mEq/L (ref 3.5–5.1)
SODIUM: 135 meq/L (ref 135–145)
TOTAL PROTEIN: 7.4 g/dL (ref 6.0–8.3)
Total Bilirubin: 0.7 mg/dL (ref 0.3–1.2)

## 2013-09-13 LAB — CBC WITH DIFFERENTIAL/PLATELET
BASOS ABS: 0 10*3/uL (ref 0.0–0.1)
Basophils Relative: 0.4 % (ref 0.0–3.0)
Eosinophils Absolute: 0.1 10*3/uL (ref 0.0–0.7)
Eosinophils Relative: 0.9 % (ref 0.0–5.0)
HCT: 38.7 % (ref 36.0–46.0)
Hemoglobin: 13 g/dL (ref 12.0–15.0)
LYMPHS PCT: 35 % (ref 12.0–46.0)
Lymphs Abs: 2.7 10*3/uL (ref 0.7–4.0)
MCHC: 33.6 g/dL (ref 30.0–36.0)
MCV: 89.5 fl (ref 78.0–100.0)
MONOS PCT: 8.1 % (ref 3.0–12.0)
Monocytes Absolute: 0.6 10*3/uL (ref 0.1–1.0)
Neutro Abs: 4.4 10*3/uL (ref 1.4–7.7)
Neutrophils Relative %: 55.6 % (ref 43.0–77.0)
PLATELETS: 231 10*3/uL (ref 150.0–400.0)
RBC: 4.32 Mil/uL (ref 3.87–5.11)
RDW: 14.1 % (ref 11.5–14.6)
WBC: 7.8 10*3/uL (ref 4.5–10.5)

## 2013-09-13 LAB — URINALYSIS, ROUTINE W REFLEX MICROSCOPIC
Bilirubin Urine: NEGATIVE
Hgb urine dipstick: NEGATIVE
Ketones, ur: NEGATIVE
LEUKOCYTES UA: NEGATIVE
Nitrite: NEGATIVE
PH: 6 (ref 5.0–8.0)
RBC / HPF: NONE SEEN (ref 0–?)
Total Protein, Urine: NEGATIVE
UROBILINOGEN UA: 0.2 (ref 0.0–1.0)
Urine Glucose: NEGATIVE

## 2013-09-13 LAB — TSH: TSH: 1.97 u[IU]/mL (ref 0.35–5.50)

## 2013-09-13 LAB — LIPID PANEL
CHOL/HDL RATIO: 8
Cholesterol: 281 mg/dL — ABNORMAL HIGH (ref 0–200)
HDL: 36.9 mg/dL — ABNORMAL LOW (ref >39.00)
LDL Cholesterol: 227 mg/dL — ABNORMAL HIGH (ref 0–99)
TRIGLYCERIDES: 84 mg/dL (ref 0.0–149.0)
VLDL: 16.8 mg/dL (ref 0.0–40.0)

## 2013-09-13 LAB — FECAL OCCULT BLOOD, GUAIAC: Fecal Occult Blood: NEGATIVE

## 2013-09-13 MED ORDER — EZETIMIBE 10 MG PO TABS: ORAL_TABLET | ORAL | Status: DC

## 2013-09-13 MED ORDER — ATORVASTATIN CALCIUM 80 MG PO TABS: 80.0000 mg | ORAL_TABLET | Freq: Every day | ORAL | Status: DC

## 2013-09-13 NOTE — Assessment & Plan Note (Signed)
She refused a flu vax Exam done Labs ordered She was referred for mammo and colonoscopy Pt ed material was given

## 2013-09-13 NOTE — Patient Instructions (Signed)
Preventive Care for Adults, Female A healthy lifestyle and preventive care can promote health and wellness. Preventive health guidelines for women include the following key practices.  A routine yearly physical is a good way to check with your health care provider about your health and preventive screening. It is a chance to share any concerns and updates on your health and to receive a thorough exam.  Visit your dentist for a routine exam and preventive care every 6 months. Brush your teeth twice a day and floss once a day. Good oral hygiene prevents tooth decay and gum disease.  The frequency of eye exams is based on your age, health, family medical history, use of contact lenses, and other factors. Follow your health care provider's recommendations for frequency of eye exams.  Eat a healthy diet. Foods like vegetables, fruits, whole grains, low-fat dairy products, and lean protein foods contain the nutrients you need without too many calories. Decrease your intake of foods high in solid fats, added sugars, and salt. Eat the right amount of calories for you.Get information about a proper diet from your health care provider, if necessary.  Regular physical exercise is one of the most important things you can do for your health. Most adults should get at least 150 minutes of moderate-intensity exercise (any activity that increases your heart rate and causes you to sweat) each week. In addition, most adults need muscle-strengthening exercises on 2 or more days a week.  Maintain a healthy weight. The body mass index (BMI) is a screening tool to identify possible weight problems. It provides an estimate of body fat based on height and weight. Your health care provider can find your BMI, and can help you achieve or maintain a healthy weight.For adults 20 years and older:  A BMI below 18.5 is considered underweight.  A BMI of 18.5 to 24.9 is normal.  A BMI of 25 to 29.9 is considered overweight.  A  BMI of 30 and above is considered obese.  Maintain normal blood lipids and cholesterol levels by exercising and minimizing your intake of saturated fat. Eat a balanced diet with plenty of fruit and vegetables. Blood tests for lipids and cholesterol should begin at age 47 and be repeated every 5 years. If your lipid or cholesterol levels are high, you are over 50, or you are at high risk for heart disease, you may need your cholesterol levels checked more frequently.Ongoing high lipid and cholesterol levels should be treated with medicines if diet and exercise are not working.  If you smoke, find out from your health care provider how to quit. If you do not use tobacco, do not start.  Lung cancer screening is recommended for adults aged 36 80 years who are at high risk for developing lung cancer because of a history of smoking. A yearly low-dose CT scan of the lungs is recommended for people who have at least a 30-pack-year history of smoking and are a current smoker or have quit within the past 15 years. A pack year of smoking is smoking an average of 1 pack of cigarettes a day for 1 year (for example: 1 pack a day for 30 years or 2 packs a day for 15 years). Yearly screening should continue until the smoker has stopped smoking for at least 15 years. Yearly screening should be stopped for people who develop a health problem that would prevent them from having lung cancer treatment.  If you are pregnant, do not drink alcohol. If you  are breastfeeding, be very cautious about drinking alcohol. If you are not pregnant and choose to drink alcohol, do not have more than 1 drink per day. One drink is considered to be 12 ounces (355 mL) of beer, 5 ounces (148 mL) of wine, or 1.5 ounces (44 mL) of liquor.  Avoid use of street drugs. Do not share needles with anyone. Ask for help if you need support or instructions about stopping the use of drugs.  High blood pressure causes heart disease and increases the risk  of stroke. Your blood pressure should be checked at least every 1 to 2 years. Ongoing high blood pressure should be treated with medicines if weight loss and exercise do not work.  If you are 47 47 years old, ask your health care provider if you should take aspirin to prevent strokes.  Diabetes screening involves taking a blood sample to check your fasting blood sugar level. This should be done once every 3 years, after age 47, if you are within normal weight and without risk factors for diabetes. Testing should be considered at a younger age or be carried out more frequently if you are overweight and have at least 1 risk factor for diabetes.  Breast cancer screening is essential preventive care for women. You should practice "breast self-awareness." This means understanding the normal appearance and feel of your breasts and may include breast self-examination. Any changes detected, no matter how small, should be reported to a health care provider. Women in their 47s and 30s should have a clinical breast exam (CBE) by a health care provider as part of a regular health exam every 1 to 3 years. After age 59, women should have a CBE every year. Starting at age 27, women should consider having a mammogram (breast X-ray test) every year. Women who have a family history of breast cancer should talk to their health care provider about genetic screening. Women at a high risk of breast cancer should talk to their health care providers about having an MRI and a mammogram every year.  Breast cancer gene (BRCA)-related cancer risk assessment is recommended for women who have family members with BRCA-related cancers. BRCA-related cancers include breast, ovarian, tubal, and peritoneal cancers. Having family members with these cancers may be associated with an increased risk for harmful changes (mutations) in the breast cancer genes BRCA1 and BRCA2. Results of the assessment will determine the need for genetic counseling  and BRCA1 and BRCA2 testing.  The Pap test is a screening test for cervical cancer. A Pap test can show cell changes on the cervix that might become cervical cancer if left untreated. A Pap test is a procedure in which cells are obtained and examined from the lower end of the uterus (cervix).  Women should have a Pap test starting at age 72.  Between ages 49 and 36, Pap tests should be repeated every 2 years.  Beginning at age 33, you should have a Pap test every 3 years as long as the past 3 Pap tests have been normal.  Some women have medical problems that increase the chance of getting cervical cancer. Talk to your health care provider about these problems. It is especially important to talk to your health care provider if a new problem develops soon after your last Pap test. In these cases, your health care provider may recommend more frequent screening and Pap tests.  The above recommendations are the same for women who have or have not gotten the vaccine  for human papillomavirus (HPV).  If you had a hysterectomy for a problem that was not cancer or a condition that could lead to cancer, then you no longer need Pap tests. Even if you no longer need a Pap test, a regular exam is a good idea to make sure no other problems are starting.  If you are between ages 58 and 10 years, and you have had normal Pap tests going back 10 years, you no longer need Pap tests. Even if you no longer need a Pap test, a regular exam is a good idea to make sure no other problems are starting.  If you have had past treatment for cervical cancer or a condition that could lead to cancer, you need Pap tests and screening for cancer for at least 20 years after your treatment.  If Pap tests have been discontinued, risk factors (such as a new sexual partner) need to be reassessed to determine if screening should be resumed.  The HPV test is an additional test that may be used for cervical cancer screening. The HPV test  looks for the virus that can cause the cell changes on the cervix. The cells collected during the Pap test can be tested for HPV. The HPV test could be used to screen women aged 67 years and older, and should be used in women of any age who have unclear Pap test results. After the age of 65, women should have HPV testing at the same frequency as a Pap test.  Colorectal cancer can be detected and often prevented. Most routine colorectal cancer screening begins at the age of 25 years and continues through age 66 years. However, your health care provider may recommend screening at an earlier age if you have risk factors for colon cancer. On a yearly basis, your health care provider may provide home test kits to check for hidden blood in the stool. Use of a small camera at the end of a tube, to directly examine the colon (sigmoidoscopy or colonoscopy), can detect the earliest forms of colorectal cancer. Talk to your health care provider about this at age 79, when routine screening begins. Direct exam of the colon should be repeated every 5 10 years through age 47 years, unless early forms of pre-cancerous polyps or small growths are found.  People who are at an increased risk for hepatitis B should be screened for this virus. You are considered at high risk for hepatitis B if:  You were born in a country where hepatitis B occurs often. Talk with your health care provider about which countries are considered high risk.  Your parents were born in a high-risk country and you have not received a shot to protect against hepatitis B (hepatitis B vaccine).  You have HIV or AIDS.  You use needles to inject street drugs.  You live with, or have sex with, someone who has Hepatitis B.  You get hemodialysis treatment.  You take certain medicines for conditions like cancer, organ transplantation, and autoimmune conditions.  Hepatitis C blood testing is recommended for all people born from [birth year] through 1965 and  any individual with known risks for hepatitis C.  Practice safe sex. Use condoms and avoid high-risk sexual practices to reduce the spread of sexually transmitted infections (STIs). STIs include gonorrhea, chlamydia, syphilis, trichomonas, herpes, HPV, and human immunodeficiency virus (HIV). Herpes, HIV, and HPV are viral illnesses that have no cure. They can result in disability, cancer, and death. Sexually active women aged 66  years and younger should be checked for chlamydia. Older women with new or multiple partners should also be tested for chlamydia. Testing for other STIs is recommended if you are sexually active and at increased risk.  Osteoporosis is a disease in which the bones lose minerals and strength with aging. This can result in serious bone fractures or breaks. The risk of osteoporosis can be identified using a bone density scan. Women ages 18 years and over and women at risk for fractures or osteoporosis should discuss screening with their health care providers. Ask your health care provider whether you should take a calcium supplement or vitamin D to reduce the rate of osteoporosis.  Menopause can be associated with physical symptoms and risks. Hormone replacement therapy is available to decrease symptoms and risks. You should talk to your health care provider about whether hormone replacement therapy is right for you.  Use sunscreen. Apply sunscreen liberally and repeatedly throughout the day. You should seek shade when your shadow is shorter than you. Protect yourself by wearing long sleeves, pants, a wide-brimmed hat, and sunglasses year round, whenever you are outdoors.  Once a month, do a whole body skin exam, using a mirror to look at the skin on your back. Tell your health care provider of new moles, moles that have irregular borders, moles that are larger than a pencil eraser, or moles that have changed in shape or color.  Stay current with required vaccines  (immunizations).  Influenza vaccine. All adults should be immunized every year.  Tetanus, diphtheria, and acellular pertussis (Td, Tdap) vaccine. Pregnant women should receive 1 dose of Tdap vaccine during each pregnancy. The dose should be obtained regardless of the length of time since the last dose. Immunization is preferred during the 27th 36th week of gestation. An adult who has not previously received Tdap or who does not know her vaccine status should receive 1 dose of Tdap. This initial dose should be followed by tetanus and diphtheria toxoids (Td) booster doses every 10 years. Adults with an unknown or incomplete history of completing a 3-dose immunization series with Td-containing vaccines should begin or complete a primary immunization series including a Tdap dose. Adults should receive a Td booster every 10 years.  Varicella vaccine. An adult without evidence of immunity to varicella should receive 2 doses or a second dose if she has previously received 1 dose. Pregnant females who do not have evidence of immunity should receive the first dose after pregnancy. This first dose should be obtained before leaving the health care facility. The second dose should be obtained 4 8 weeks after the first dose.  Human papillomavirus (HPV) vaccine. Females aged 9 26 years who have not received the vaccine previously should obtain the 3-dose series. The vaccine is not recommended for use in pregnant females. However, pregnancy testing is not needed before receiving a dose. If a female is found to be pregnant after receiving a dose, no treatment is needed. In that case, the remaining doses should be delayed until after the pregnancy. Immunization is recommended for any person with an immunocompromised condition through the age of 51 years if she did not get any or all doses earlier. During the 3-dose series, the second dose should be obtained 4 8 weeks after the first dose. The third dose should be obtained  24 weeks after the first dose and 16 weeks after the second dose.  Zoster vaccine. One dose is recommended for adults aged 57 years or older unless certain  conditions are present.  Measles, mumps, and rubella (MMR) vaccine. Adults born before [birth year] generally are considered immune to measles and mumps. Adults born in [birth year] or later should have 1 or more doses of MMR vaccine unless there is a contraindication to the vaccine or there is laboratory evidence of immunity to each of the three diseases. A routine second dose of MMR vaccine should be obtained at least 28 days after the first dose for students attending postsecondary schools, health care workers, or international travelers. People who received inactivated measles vaccine or an unknown type of measles vaccine during 1963 1967 should receive 2 doses of MMR vaccine. People who received inactivated mumps vaccine or an unknown type of mumps vaccine before 1979 and are at high risk for mumps infection should consider immunization with 2 doses of MMR vaccine. For females of childbearing age, rubella immunity should be determined. If there is no evidence of immunity, females who are not pregnant should be vaccinated. If there is no evidence of immunity, females who are pregnant should delay immunization until after pregnancy. Unvaccinated health care workers born before [birth year] who lack laboratory evidence of measles, mumps, or rubella immunity or laboratory confirmation of disease should consider measles and mumps immunization with 2 doses of MMR vaccine or rubella immunization with 1 dose of MMR vaccine.  Pneumococcal 13-valent conjugate (PCV13) vaccine. When indicated, a person who is uncertain of her immunization history and has no record of immunization should receive the PCV13 vaccine. An adult aged 70 years or older who has certain medical conditions and has not been previously immunized should receive 1 dose of PCV13 vaccine. This PCV13 should be followed  with a dose of pneumococcal polysaccharide (PPSV23) vaccine. The PPSV23 vaccine dose should be obtained at least 8 weeks after the dose of PCV13 vaccine. An adult aged 58 years or older who has certain medical conditions and previously received 1 or more doses of PPSV23 vaccine should receive 1 dose of PCV13. The PCV13 vaccine dose should be obtained 1 or more years after the last PPSV23 vaccine dose.  Pneumococcal polysaccharide (PPSV23) vaccine. When PCV13 is also indicated, PCV13 should be obtained first. All adults aged 96 years and older should be immunized. An adult younger than age 24 years who has certain medical conditions should be immunized. Any person who resides in a nursing home or long-term care facility should be immunized. An adult smoker should be immunized. People with an immunocompromised condition and certain other conditions should receive both PCV13 and PPSV23 vaccines. People with human immunodeficiency virus (HIV) infection should be immunized as soon as possible after diagnosis. Immunization during chemotherapy or radiation therapy should be avoided. Routine use of PPSV23 vaccine is not recommended for American Indians, [place] Natives, or people younger than 65 years unless there are medical conditions that require PPSV23 vaccine. When indicated, people who have unknown immunization and have no record of immunization should receive PPSV23 vaccine. One-time revaccination 5 years after the first dose of PPSV23 is recommended for people aged 41 64 years who have chronic kidney failure, nephrotic syndrome, asplenia, or immunocompromised conditions. People who received 1 2 doses of PPSV23 before age 45 years should receive another dose of PPSV23 vaccine at age 62 years or later if at least 5 years have passed since the previous dose. Doses of PPSV23 are not needed for people immunized with PPSV23 at or after age 57 years.  Meningococcal vaccine. Adults with asplenia or persistent complement  component deficiencies should receive 2  doses of quadrivalent meningococcal conjugate (MenACWY-D) vaccine. The doses should be obtained at least 2 months apart. Microbiologists working with certain meningococcal bacteria, [place] recruits, people at risk during an outbreak, and people who travel to or live in countries with a high rate of meningitis should be immunized. A first-year college student up through age 49 years who is living in a residence hall should receive a dose if she did not receive a dose on or after her 16th birthday. Adults who have certain high-risk conditions should receive one or more doses of vaccine.  Hepatitis A vaccine. Adults who wish to be protected from this disease, have certain high-risk conditions, work with hepatitis A-infected animals, work in hepatitis A research labs, or travel to or work in countries with a high rate of hepatitis A should be immunized. Adults who were previously unvaccinated and who anticipate close contact with an international adoptee during the first 60 days after arrival in the [place] from a country with a high rate of hepatitis A should be immunized.  Hepatitis B vaccine. Adults who wish to be protected from this disease, have certain high-risk conditions, may be exposed to blood or other infectious body fluids, are household contacts or sex partners of hepatitis B positive people, are clients or workers in certain care facilities, or travel to or work in countries with a high rate of hepatitis B should be immunized.  Haemophilus influenzae type b (Hib) vaccine. A previously unvaccinated person with asplenia or sickle cell disease or having a scheduled splenectomy should receive 1 dose of Hib vaccine. Regardless of previous immunization, a recipient of a hematopoietic stem cell transplant should receive a 3-dose series 6 12 months after her successful transplant. Hib vaccine is not recommended for adults with HIV infection. Preventive  Services / Frequency Ages 24 to 39years  Blood pressure check.** / Every 1 to 2 years.  Lipid and cholesterol check.** / Every 5 years beginning at age 66.  Clinical breast exam.** / Every 3 years for women in their 12s and 24s.  BRCA-related cancer risk assessment.** / For women who have family members with a BRCA-related cancer (breast, ovarian, tubal, or peritoneal cancers).  Pap test.** / Every 2 years from ages 31 through 69. Every 3 years starting at age 64 through age 76 or 89 with a history of 3 consecutive normal Pap tests.  HPV screening.** / Every 3 years from ages 10 through ages 10 to 96 with a history of 3 consecutive normal Pap tests.  Hepatitis C blood test.** / For any individual with known risks for hepatitis C.  Skin self-exam. / Monthly.  Influenza vaccine. / Every year.  Tetanus, diphtheria, and acellular pertussis (Tdap, Td) vaccine.** / Consult your health care provider. Pregnant women should receive 1 dose of Tdap vaccine during each pregnancy. 1 dose of Td every 10 years.  Varicella vaccine.** / Consult your health care provider. Pregnant females who do not have evidence of immunity should receive the first dose after pregnancy.  HPV vaccine. / 3 doses over 6 months, if 90 and younger. The vaccine is not recommended for use in pregnant females. However, pregnancy testing is not needed before receiving a dose.  Measles, mumps, rubella (MMR) vaccine.** / You need at least 1 dose of MMR if you were born in 1957 or later. You may also need a 2nd dose. For females of childbearing age, rubella immunity should be determined. If there is no evidence of immunity, females who are not  pregnant should be vaccinated. If there is no evidence of immunity, females who are pregnant should delay immunization until after pregnancy.  Pneumococcal 13-valent conjugate (PCV13) vaccine.** / Consult your health care provider.  Pneumococcal polysaccharide (PPSV23) vaccine.** / 1 to 2  doses if you smoke cigarettes or if you have certain conditions.  Meningococcal vaccine.** / 1 dose if you are age 88 to 6 years and a Market researcher living in a residence hall, or have one of several medical conditions, you need to get vaccinated against meningococcal disease. You may also need additional booster doses.  Hepatitis A vaccine.** / Consult your health care provider.  Hepatitis B vaccine.** / Consult your health care provider.  Haemophilus influenzae type b (Hib) vaccine.** / Consult your health care provider. Ages 23 to 64years  Blood pressure check.** / Every 1 to 2 years.  Lipid and cholesterol check.** / Every 5 years beginning at age 20 years.  Lung cancer screening. / Every year if you are aged 51 80 years and have a 30-pack-year history of smoking and currently smoke or have quit within the past 15 years. Yearly screening is stopped once you have quit smoking for at least 15 years or develop a health problem that would prevent you from having lung cancer treatment.  Clinical breast exam.** / Every year after age 8 years.  BRCA-related cancer risk assessment.** / For women who have family members with a BRCA-related cancer (breast, ovarian, tubal, or peritoneal cancers).  Mammogram.** / Every year beginning at age 10 years and continuing for as long as you are in good health. Consult with your health care provider.  Pap test.** / Every 3 years starting at age 30 years through age 5 or 61 years with a history of 3 consecutive normal Pap tests.  HPV screening.** / Every 3 years from ages 39 years through ages 72 to 19 years with a history of 3 consecutive normal Pap tests.  Fecal occult blood test (FOBT) of stool. / Every year beginning at age 59 years and continuing until age 27 years. You may not need to do this test if you get a colonoscopy every 10 years.  Flexible sigmoidoscopy or colonoscopy.** / Every 5 years for a flexible sigmoidoscopy or every  10 years for a colonoscopy beginning at age 110 years and continuing until age 63 years.  Hepatitis C blood test.** / For all people born from [birth year] through 1964 and any individual with known risks for hepatitis C.  Skin self-exam. / Monthly.  Influenza vaccine. / Every year.  Tetanus, diphtheria, and acellular pertussis (Tdap/Td) vaccine.** / Consult your health care provider. Pregnant women should receive 1 dose of Tdap vaccine during each pregnancy. 1 dose of Td every 10 years.  Varicella vaccine.** / Consult your health care provider. Pregnant females who do not have evidence of immunity should receive the first dose after pregnancy.  Zoster vaccine.** / 1 dose for adults aged 46 years or older.  Measles, mumps, rubella (MMR) vaccine.** / You need at least 1 dose of MMR if you were born in 1956 or later. You may also need a 2nd dose. For females of childbearing age, rubella immunity should be determined. If there is no evidence of immunity, females who are not pregnant should be vaccinated. If there is no evidence of immunity, females who are pregnant should delay immunization until after pregnancy.  Pneumococcal 13-valent conjugate (PCV13) vaccine.** / Consult your health care provider.  Pneumococcal polysaccharide (PPSV23) vaccine.** / 1  to 2 doses if you smoke cigarettes or if you have certain conditions.  Meningococcal vaccine.** / Consult your health care provider.  Hepatitis A vaccine.** / Consult your health care provider.  Hepatitis B vaccine.** / Consult your health care provider.  Haemophilus influenzae type b (Hib) vaccine.** / Consult your health care provider. Ages 77 years and over  Blood pressure check.** / Every 1 to 2 years.  Lipid and cholesterol check.** / Every 5 years beginning at age 79 years.  Lung cancer screening. / Every year if you are aged 95 80 years and have a 30-pack-year history of smoking and currently smoke or have quit within the past 15 years.  Yearly screening is stopped once you have quit smoking for at least 15 years or develop a health problem that would prevent you from having lung cancer treatment.  Clinical breast exam.** / Every year after age 35 years.  BRCA-related cancer risk assessment.** / For women who have family members with a BRCA-related cancer (breast, ovarian, tubal, or peritoneal cancers).  Mammogram.** / Every year beginning at age 43 years and continuing for as long as you are in good health. Consult with your health care provider.  Pap test.** / Every 3 years starting at age 3 years through age 65 or 82 years with 3 consecutive normal Pap tests. Testing can be stopped between 65 and 70 years with 3 consecutive normal Pap tests and no abnormal Pap or HPV tests in the past 10 years.  HPV screening.** / Every 3 years from ages 47 years through ages 67 or 17 years with a history of 3 consecutive normal Pap tests. Testing can be stopped between 65 and 70 years with 3 consecutive normal Pap tests and no abnormal Pap or HPV tests in the past 10 years.  Fecal occult blood test (FOBT) of stool. / Every year beginning at age 49 years and continuing until age 60 years. You may not need to do this test if you get a colonoscopy every 10 years.  Flexible sigmoidoscopy or colonoscopy.** / Every 5 years for a flexible sigmoidoscopy or every 10 years for a colonoscopy beginning at age 38 years and continuing until age 30 years.  Hepatitis C blood test.** / For all people born from [birth year] through 1964 and any individual with known risks for hepatitis C.  Osteoporosis screening.** / A one-time screening for women ages 66 years and over and women at risk for fractures or osteoporosis.  Skin self-exam. / Monthly.  Influenza vaccine. / Every year.  Tetanus, diphtheria, and acellular pertussis (Tdap/Td) vaccine.** / 1 dose of Td every 10 years.  Varicella vaccine.** / Consult your health care provider.  Zoster vaccine.** / 1  dose for adults aged 79 years or older.  Pneumococcal 13-valent conjugate (PCV13) vaccine.** / Consult your health care provider.  Pneumococcal polysaccharide (PPSV23) vaccine.** / 1 dose for all adults aged 39 years and older.  Meningococcal vaccine.** / Consult your health care provider.  Hepatitis A vaccine.** / Consult your health care provider.  Hepatitis B vaccine.** / Consult your health care provider.  Haemophilus influenzae type b (Hib) vaccine.** / Consult your health care provider. ** Family history and personal history of risk and conditions may change your health care provider's recommendations. Document Released: 08/24/2001 Document Revised: 04/18/2013 Document Reviewed: 11/23/2010 [facility] Patient Information 2014 [place], [place]. Flank Pain Flank pain refers to pain that is located on the side of the body between the upper abdomen and the back. The pain  may occur over a short period of time (acute) or may be long-term or reoccurring (chronic). It may be mild or severe. Flank pain can be caused by many things. CAUSES  Some of the more common causes of flank pain include:  Muscle strains.   Muscle spasms.   A disease of your spine (vertebral disk disease).   A lung infection (pneumonia).   Fluid around your lungs (pulmonary edema).   A kidney infection.   Kidney stones.   A very painful skin rash caused by the chickenpox virus (shingles).   Gallbladder disease.  [place] care will depend on the cause of your pain. In general,  Rest as directed by your caregiver.  Drink enough fluids to keep your urine clear or pale yellow.  Only take over-the-counter or prescription medicines as directed by your caregiver. Some medicines may help relieve the pain.  Tell your caregiver about any changes in your pain.  Follow up with your caregiver as directed. SEEK IMMEDIATE MEDICAL CARE IF:   Your pain is not controlled with medicine.    You have new or worsening symptoms.  Your pain increases.   You have abdominal pain.   You have shortness of breath.   You have persistent nausea or vomiting.   You have swelling in your abdomen.   You feel faint or pass out.   You have blood in your urine.  You have a fever or persistent symptoms for more than 2 3 days.  You have a fever and your symptoms suddenly get worse. MAKE SURE YOU:   Understand these instructions.  Will watch your condition.  Will get help right away if you are not doing well or get worse. Document Released: 08/19/2005 Document Revised: 03/22/2012 Document Reviewed: 02/10/2012 [employer] [facility] [employer] Patient Information 2014 [place].

## 2013-09-13 NOTE — Assessment & Plan Note (Signed)
She will restart lipitor and zetia I will recheck her FLP today

## 2013-09-13 NOTE — Assessment & Plan Note (Signed)
She does not have s/s of this today but I have asked her to have a routine cardiology f/up about this

## 2013-09-13 NOTE — Assessment & Plan Note (Signed)
Based on hx./exam I am not certain about the cause for this pain I will check a plain film today to see if she has renal stone, stool burden, SBO, etc Will also check her labs to look for causes of this pain

## 2013-09-13 NOTE — Progress Notes (Signed)
Pre visit review using our clinic review tool, if applicable. No additional management support is needed unless otherwise documented below in the visit note. 

## 2013-09-13 NOTE — Progress Notes (Signed)
Subjective:    Patient ID: Jacqueline Ferrell, female    DOB: Aug 02, 1965, 48 y.o.   MRN: [id number]  Flank Pain This is a recurrent problem. Episode onset: for 2-3 months. The problem occurs constantly. The problem is unchanged. Pain location: right side. The quality of the pain is described as aching. The pain does not radiate. The pain is at a severity of 2/10. The pain is mild. The pain is worse during the day. Associated symptoms include abdominal pain. Pertinent negatives include no bladder incontinence, bowel incontinence, chest pain, dysuria, fever, headaches, leg pain, numbness, paresis, paresthesias, pelvic pain, perianal numbness, tingling, weakness or weight loss. She has tried analgesics for the symptoms. The treatment provided moderate relief.      Review of Systems  Constitutional: Negative.  Negative for fever, chills, weight loss, diaphoresis, activity change, appetite change, fatigue and unexpected weight change.  HENT: Negative.   Eyes: Negative.   Respiratory: Negative.  Negative for cough, choking, chest tightness, shortness of breath, wheezing and stridor.   Cardiovascular: Positive for leg swelling. Negative for chest pain and palpitations.  Gastrointestinal: Positive for abdominal pain, constipation, blood in stool and anal bleeding. Negative for nausea, vomiting, diarrhea, abdominal distention, rectal pain and bowel incontinence.  Endocrine: Negative.   Genitourinary: Positive for flank pain. Negative for bladder incontinence, dysuria, urgency, frequency, hematuria, decreased urine volume, vaginal bleeding, vaginal discharge, enuresis, difficulty urinating, vaginal pain, menstrual problem, pelvic pain and dyspareunia.  Musculoskeletal: Negative.   Skin: Negative.   Allergic/Immunologic: Negative.   Neurological: Negative.  Negative for dizziness, tingling, tremors, weakness, numbness, headaches and paresthesias.  Hematological: Negative.  Negative for adenopathy. Does not  bruise/bleed easily.  Psychiatric/Behavioral: Negative.        Objective:   Physical Exam  Vitals reviewed. Constitutional: She is oriented to person, place, and time. She appears well-developed and well-nourished.  Non-toxic appearance. She does not have a sickly appearance. She does not appear ill. No distress.  HENT:  Head: Normocephalic and atraumatic.  Mouth/Throat: Oropharynx is clear and moist. No oropharyngeal exudate.  Eyes: Conjunctivae are normal. Right eye exhibits no discharge. Left eye exhibits no discharge. No scleral icterus.  Neck: Normal range of motion. Neck supple. No JVD present. No tracheal deviation present. No thyromegaly present.  Cardiovascular: Normal rate, regular rhythm, normal heart sounds and intact distal pulses.  Exam reveals no gallop.   No murmur heard. Pulmonary/Chest: Effort normal and breath sounds normal. No accessory muscle usage or stridor. Not tachypneic. No respiratory distress. She has no decreased breath sounds. She has no wheezes. She has no rhonchi. She has no rales. Chest wall is not dull to percussion. She exhibits no mass, no tenderness, no bony tenderness, no laceration, no crepitus, no edema, no deformity, no swelling and no retraction. Right breast exhibits no inverted nipple, no mass, no nipple discharge, no skin change and no tenderness. Left breast exhibits no inverted nipple, no mass, no nipple discharge, no skin change and no tenderness. Breasts are symmetrical.  Abdominal: Soft. Normal appearance and bowel sounds are normal. She exhibits no distension and no mass. There is no hepatosplenomegaly, splenomegaly or hepatomegaly. There is tenderness in the right lower quadrant. There is no rebound, no CVA tenderness, no tenderness at McBurney's point and negative Murphy's sign. No hernia. Hernia confirmed negative in the ventral area, confirmed negative in the right inguinal area and confirmed negative in the left inguinal area.  Genitourinary:  Rectal exam shows no external hemorrhoid, no internal hemorrhoid, no  fissure, no mass, no tenderness and anal tone normal. Guaiac negative stool. Right adnexum displays no mass, no tenderness and no fullness. Left adnexum displays no mass, no tenderness and no fullness.  Musculoskeletal: Normal range of motion. She exhibits edema (trace edema in BLE). She exhibits no tenderness.  Lymphadenopathy:    She has no cervical adenopathy.  Neurological: She is oriented to person, place, and time.  Skin: Skin is warm and dry. No rash noted. She is not diaphoretic. No erythema. No pallor.  Psychiatric: She has a normal mood and affect. Her behavior is normal. Judgment and thought content normal.     Lab Results  Component Value Date   WBC 6.1 09/17/2010   HGB 12.9 09/17/2010   HCT 38.7 09/17/2010   PLT 204 09/17/2010   GLUCOSE 98 02/28/2012   CHOL 133 12/29/2011   TRIG 53.0 12/29/2011   HDL 34.30* 12/29/2011   LDLCALC 88 12/29/2011   ALT 19 02/28/2012   AST 23 02/28/2012   NA 140 02/28/2012   K 3.0* 02/28/2012   CL 106 02/28/2012   CREATININE 0.7 02/28/2012   BUN 15 02/28/2012   CO2 27 02/28/2012   TSH 2.183 07/28/2010   INR 1.1* 09/11/2010   HGBA1C 6.2 12/29/2011       Assessment & Plan:

## 2013-09-13 NOTE — Assessment & Plan Note (Signed)
I have asked her to see GI to consider having a colonoscopy done

## 2013-09-13 NOTE — Assessment & Plan Note (Signed)
Her BP is well controlled I will check her lytes and renal function 

## 2013-09-14 ENCOUNTER — Telehealth: Payer: Self-pay | Admitting: *Deleted

## 2013-09-14 MED ORDER — METOPROLOL SUCCINATE ER 25 MG PO TB24: ORAL_TABLET | ORAL | Status: DC

## 2013-09-14 NOTE — Telephone Encounter (Signed)
Patient phoned stating pharmacy had not received metoprolol refill with all of her other meds.  Refilled per protocol and MD documentation.  Notified patient.

## 2013-09-20 ENCOUNTER — Encounter: Payer: Self-pay | Admitting: Internal Medicine

## 2013-09-20 ENCOUNTER — Other Ambulatory Visit: Payer: Self-pay | Admitting: Internal Medicine

## 2013-09-20 DIAGNOSIS — I10 Essential (primary) hypertension: Secondary | ICD-10-CM

## 2013-09-20 MED ORDER — LOSARTAN POTASSIUM-HCTZ 100-25 MG PO TABS: 1.0000 | ORAL_TABLET | Freq: Every day | ORAL | Status: DC

## 2013-10-09 ENCOUNTER — Ambulatory Visit (INDEPENDENT_AMBULATORY_CARE_PROVIDER_SITE_OTHER): Payer: Managed Care, Other (non HMO) | Admitting: Cardiology

## 2013-10-09 ENCOUNTER — Encounter: Payer: Self-pay | Admitting: Cardiology

## 2013-10-09 VITALS — BP 142/90 | HR 71 | Ht 67.0 in | Wt 335.0 lb

## 2013-10-09 DIAGNOSIS — E785 Hyperlipidemia, unspecified: Secondary | ICD-10-CM

## 2013-10-09 DIAGNOSIS — E7849 Other hyperlipidemia: Secondary | ICD-10-CM

## 2013-10-09 DIAGNOSIS — I10 Essential (primary) hypertension: Secondary | ICD-10-CM | POA: Insufficient documentation

## 2013-10-09 DIAGNOSIS — I2510 Atherosclerotic heart disease of native coronary artery without angina pectoris: Secondary | ICD-10-CM

## 2013-10-09 NOTE — Patient Instructions (Signed)
Your physician recommends that you continue on your current medications as directed. Please refer to the Current Medication list given to you today.  Your physician wants you to follow-up in: 1 year with Dr. Skains. You will receive a reminder letter in the mail two months in advance. If you don't receive a letter, please call our office to schedule the follow-up appointment.  

## 2013-10-09 NOTE — Progress Notes (Signed)
[place]. [address], [address] [place], [place]  [address] Phone: [phone] Fax:  [phone]  Date:  10/09/2013   ID:  Jacqueline Ferrell, DOB 03/10/1966, MRN [id number]  PCP:  Scarlette Calico, MD   History of Present Illness: Jacqueline Ferrell is a 48 y.o. female  here at the request of Dr. Ronnald Ramp to followup or coronary artery disease.  She has hypertension, hyperlipidemia, morbid obesity and coronary artery disease with previous drug-eluting stent to the LAD after progressive exertional chest pain and abnormal stress test. Her heart catheterization showed 80% proximal LAD stenosis and a stent was placed in March of 2012.  She had a prior cardiology visit over a year ago with [facility] (Dr. Aundra Dubin).  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 This is likely a strong stream factor to her aggressive coronary artery disease at such a young age.   Wt Readings from Last 3 Encounters:  10/09/13 335 lb (151.955 kg)  09/13/13 333 lb 6 oz (151.218 kg)  02/28/12 328 lb 8 oz (149.007 kg)     Past Medical History  Diagnosis Date  . Hypertension   . Nephrolithiasis   . Hyperlipidemia   . Obese   . Chest pain, exertional     lexiscan myoview in 1/12 with EF 52% and a small reversible defect in diagonal territory either due to ischemia or shifting breast attenuation. ECHO 50-55%, mild LAE, mild LVH, no significant valvular abnormalities  . Coronary artery disease   . Poor compliance with medication     Past Surgical History  Procedure Laterality Date  . Abdominal hysterectomy  2009    Current Outpatient Prescriptions  Medication Sig Dispense Refill  . ALPRAZolam (XANAX) 0.25 MG tablet Take 1 tablet (0.25 mg total) by mouth 3 (three) times daily as needed.  60 tablet  4  . aspirin 81 MG EC tablet Take 81 mg by mouth daily.        Marland Kitchen atorvastatin (LIPITOR) 80 MG tablet Take 1 tablet (80 mg total) by mouth daily.  90 tablet  3  . Calcium Carbonate (CALTRATE  600) 1500 MG TABS Take 1 tablet by mouth daily.        . Cholecalciferol (VITAMIN D3) 1000 UNITS tablet Take 1,000 Units by mouth daily.        Marland Kitchen ezetimibe (ZETIA) 10 MG tablet TAKE 1 TABLET BY MOUTH EVERY DAY  90 tablet  3  . losartan-hydrochlorothiazide (HYZAAR) 100-25 MG per tablet Take 1 tablet by mouth daily.  90 tablet  2  . metoprolol succinate (TOPROL-XL) 25 MG 24 hr tablet TAKE 1 TABLET BY MOUTH EVERY DAY  90 tablet  3  . Multiple Vitamin (MULTIVITAMIN) tablet Take 1 tablet by mouth daily.        . Omega-3 350 MG CAPS Take 1 capsule by mouth daily.         No current facility-administered medications for this visit.    Allergies:    Allergies  Allergen Reactions  . Lisinopril     REACTION: Tongue swelling    Social History:  The patient  reports that she has never smoked. She has never used smokeless tobacco. She reports that she does not drink alcohol or use illicit drugs.   Family History  Problem Relation Age of Onset  . Heart attack Father 104    MI  . Heart disease Father   . Hypertension Other   . Hyperlipidemia Other   .  Heart attack Cousin     5 first cousins with MI's in their 42s  . Coronary artery disease Neg Hx   . Stroke Neg Hx   . Alcohol abuse Neg Hx   . Cancer Neg Hx   . Diabetes Neg Hx   . Early death Neg Hx   . Hypertension Mother   . Hyperlipidemia Sister   . Hypertension Brother   . Hypertension Brother    father had MI in his early 63s, bypass in his 7s  ROS:  Please see the history of present illness.   Denies any fevers, chills, orthopnea, PND, syncope. She has right ankle pain from prior ligament damage. She has chronic lower extremity swelling. Shortness of breath with exertion.  All other systems reviewed and negative.   PHYSICAL EXAM: VS:  BP 142/90  Pulse 71  Ht 5\' 7"  (1.702 m)  Wt 335 lb (151.955 kg)  BMI 52.46 kg/m2  LMP 02/10/2008 Well nourished, well developed, in no acute distress HEENT: normal, [place]/AT, EOMI Neck: no JVD,  normal carotid upstroke, no bruit Cardiac:  normal S1, S2; RRR; no murmur Lungs:  clear to auscultation bilaterally, no wheezing, rhonchi or rales Abd: soft, nontender, no hepatomegaly, no bruitsobese Ext: 1+ chronic edema, 2+ distal pulses Skin: warm and dry GU: deferred Neuro: no focal abnormalities noted, AAO x 3  EKG:  10/09/13-Sinus rhythm rate 71 with no other abnormalities.  LABS: 09/13/13 - LDL 227   ASSESSMENT AND PLAN:  1. CAD-pharmacologic stress test reassuring, no exertional chest pain. LAD stent. DES. She has been off of dual antiplatelet therapy for over a year and a half. No symptoms. Continue with aspirin. 2. Familial hyperlipidemia-LDL 227 without medications. She is now back on atorvastatin and Zetia. LDL goal with CAD is 70. Hopefully we are at least able to reduce her LDL by 50%. Aggressive coronary artery disease. 3. Morbid obesity-continue to encourage weight loss. Exercise. Doing better. 4. Hypertension-mildly elevated today. Continue with current multidrug regimen. Dr. Ronnald Ramp. 5. One year followup. (Offered her f/u with Dr. Aundra Dubin if she desired)  Signed, Candee Furbish, MD [facility]  10/09/2013 3:15 PM

## 2013-10-10 ENCOUNTER — Ambulatory Visit
Admission: RE | Admit: 2013-10-10 | Discharge: 2013-10-10 | Disposition: A | Discharge status: Home / Self Care | Payer: Managed Care, Other (non HMO) | Source: Ambulatory Visit | Attending: Internal Medicine | Admitting: Internal Medicine

## 2013-10-10 DIAGNOSIS — Z1231 Encounter for screening mammogram for malignant neoplasm of breast: Secondary | ICD-10-CM

## 2013-10-11 LAB — HM MAMMOGRAPHY: HM MAMMO: ABNORMAL

## 2013-10-12 ENCOUNTER — Other Ambulatory Visit: Payer: Self-pay | Admitting: Internal Medicine

## 2013-10-12 DIAGNOSIS — R928 Other abnormal and inconclusive findings on diagnostic imaging of breast: Secondary | ICD-10-CM

## 2013-10-24 ENCOUNTER — Ambulatory Visit
Admission: RE | Admit: 2013-10-24 | Discharge: 2013-10-24 | Disposition: A | Discharge status: Home / Self Care | Payer: Managed Care, Other (non HMO) | Source: Ambulatory Visit | Attending: Internal Medicine | Admitting: Internal Medicine

## 2013-10-24 DIAGNOSIS — R928 Other abnormal and inconclusive findings on diagnostic imaging of breast: Secondary | ICD-10-CM

## 2014-02-12 ENCOUNTER — Encounter: Payer: Self-pay | Admitting: Internal Medicine

## 2014-02-12 NOTE — Telephone Encounter (Signed)
Pt also left msg on triage requesting refills on all medications...Jacqueline Ferrell

## 2014-02-13 ENCOUNTER — Other Ambulatory Visit: Payer: Self-pay | Admitting: *Deleted

## 2014-02-13 DIAGNOSIS — F418 Other specified anxiety disorders: Secondary | ICD-10-CM

## 2014-02-13 MED ORDER — ATORVASTATIN CALCIUM 80 MG PO TABS: 80.0000 mg | ORAL_TABLET | Freq: Every day | ORAL | Status: DC

## 2014-02-13 MED ORDER — METOPROLOL SUCCINATE ER 25 MG PO TB24: ORAL_TABLET | ORAL | Status: DC

## 2014-02-13 MED ORDER — LOSARTAN POTASSIUM-HCTZ 100-25 MG PO TABS: 1.0000 | ORAL_TABLET | Freq: Every day | ORAL | Status: DC

## 2014-02-13 NOTE — Telephone Encounter (Signed)
Left msg on triage requesting refill on her hydrocodone,xanax,  BP & cholesterol med. Sent maintenance meds pls advise on hydrocodone not on med list.../lmb

## 2014-02-19 ENCOUNTER — Other Ambulatory Visit (INDEPENDENT_AMBULATORY_CARE_PROVIDER_SITE_OTHER): Payer: Managed Care, Other (non HMO)

## 2014-02-19 ENCOUNTER — Encounter: Payer: Self-pay | Admitting: Internal Medicine

## 2014-02-19 ENCOUNTER — Ambulatory Visit (INDEPENDENT_AMBULATORY_CARE_PROVIDER_SITE_OTHER): Payer: Managed Care, Other (non HMO) | Admitting: Internal Medicine

## 2014-02-19 VITALS — BP 118/64 | HR 69 | Temp 97.9°F | Resp 16 | Ht 67.0 in | Wt 333.0 lb

## 2014-02-19 DIAGNOSIS — E876 Hypokalemia: Secondary | ICD-10-CM

## 2014-02-19 DIAGNOSIS — M1710 Unilateral primary osteoarthritis, unspecified knee: Secondary | ICD-10-CM

## 2014-02-19 DIAGNOSIS — M5430 Sciatica, unspecified side: Secondary | ICD-10-CM

## 2014-02-19 DIAGNOSIS — E785 Hyperlipidemia, unspecified: Secondary | ICD-10-CM

## 2014-02-19 DIAGNOSIS — I2510 Atherosclerotic heart disease of native coronary artery without angina pectoris: Secondary | ICD-10-CM

## 2014-02-19 DIAGNOSIS — M179 Osteoarthritis of knee, unspecified: Secondary | ICD-10-CM

## 2014-02-19 DIAGNOSIS — I10 Essential (primary) hypertension: Secondary | ICD-10-CM

## 2014-02-19 DIAGNOSIS — E8881 Metabolic syndrome: Secondary | ICD-10-CM

## 2014-02-19 DIAGNOSIS — M48061 Spinal stenosis, lumbar region without neurogenic claudication: Secondary | ICD-10-CM | POA: Insufficient documentation

## 2014-02-19 DIAGNOSIS — R809 Proteinuria, unspecified: Secondary | ICD-10-CM

## 2014-02-19 DIAGNOSIS — M5440 Lumbago with sciatica, unspecified side: Secondary | ICD-10-CM

## 2014-02-19 LAB — COMPREHENSIVE METABOLIC PANEL
ALT: 28 U/L (ref 0–35)
AST: 22 U/L (ref 0–37)
Albumin: 4.3 g/dL (ref 3.5–5.2)
Alkaline Phosphatase: 55 U/L (ref 39–117)
BILIRUBIN TOTAL: 0.7 mg/dL (ref 0.2–1.2)
BUN: 24 mg/dL — ABNORMAL HIGH (ref 6–23)
CO2: 24 mEq/L (ref 19–32)
CREATININE: 0.7 mg/dL (ref 0.4–1.2)
Calcium: 9.8 mg/dL (ref 8.4–10.5)
Chloride: 104 mEq/L (ref 96–112)
GFR: 108.19 mL/min (ref >60.00)
GLUCOSE: 89 mg/dL (ref 70–99)
Potassium: 3.5 mEq/L (ref 3.5–5.1)
Sodium: 138 mEq/L (ref 135–145)
TOTAL PROTEIN: 8.1 g/dL (ref 6.0–8.3)

## 2014-02-19 LAB — CBC WITH DIFFERENTIAL/PLATELET
BASOS PCT: 0.4 % (ref 0.0–3.0)
Basophils Absolute: 0 10*3/uL (ref 0.0–0.1)
EOS PCT: 0.9 % (ref 0.0–5.0)
Eosinophils Absolute: 0.1 10*3/uL (ref 0.0–0.7)
HEMATOCRIT: 41.2 % (ref 36.0–46.0)
HEMOGLOBIN: 13.6 g/dL (ref 12.0–15.0)
LYMPHS ABS: 3.2 10*3/uL (ref 0.7–4.0)
Lymphocytes Relative: 40.3 % (ref 12.0–46.0)
MCHC: 32.9 g/dL (ref 30.0–36.0)
MCV: 91.3 fl (ref 78.0–100.0)
MONOS PCT: 7.4 % (ref 3.0–12.0)
Monocytes Absolute: 0.6 10*3/uL (ref 0.1–1.0)
Neutro Abs: 4 10*3/uL (ref 1.4–7.7)
Neutrophils Relative %: 51 % (ref 43.0–77.0)
PLATELETS: 246 10*3/uL (ref 150.0–400.0)
RBC: 4.51 Mil/uL (ref 3.87–5.11)
RDW: 13.7 % (ref 11.5–15.5)
WBC: 7.9 10*3/uL (ref 4.0–10.5)

## 2014-02-19 LAB — LIPID PANEL
CHOLESTEROL: 150 mg/dL (ref 0–200)
HDL: 30.2 mg/dL — AB (ref >39.00)
LDL CALC: 111 mg/dL — AB (ref 0–99)
NonHDL: 119.8
Total CHOL/HDL Ratio: 5
Triglycerides: 43 mg/dL (ref 0.0–149.0)
VLDL: 8.6 mg/dL (ref 0.0–40.0)

## 2014-02-19 LAB — CK: Total CK: 256 U/L — ABNORMAL HIGH (ref 7–177)

## 2014-02-19 LAB — HEMOGLOBIN A1C: HEMOGLOBIN A1C: 6.2 % (ref 4.6–6.5)

## 2014-02-19 LAB — TSH: TSH: 3.03 u[IU]/mL (ref 0.35–4.50)

## 2014-02-19 MED ORDER — HYDROCODONE-ACETAMINOPHEN 10-325 MG PO TABS: 1.0000 | ORAL_TABLET | Freq: Three times a day (TID) | ORAL | Status: DC | PRN

## 2014-02-19 NOTE — Assessment & Plan Note (Signed)
She has achieved her LDL goal 

## 2014-02-19 NOTE — Progress Notes (Signed)
Pre visit review using our clinic review tool, if applicable. No additional management support is needed unless otherwise documented below in the visit note. 

## 2014-02-19 NOTE — Assessment & Plan Note (Signed)
She will take hydrocdone as needed for pain

## 2014-02-19 NOTE — Assessment & Plan Note (Signed)
She has pre-diabetes 

## 2014-02-19 NOTE — Patient Instructions (Signed)

## 2014-02-19 NOTE — Assessment & Plan Note (Signed)
Her BP is well controlled Her lytes and renal function are stable 

## 2014-02-19 NOTE — Assessment & Plan Note (Signed)
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

## 2014-02-19 NOTE — Progress Notes (Signed)
Subjective:    Patient ID: Jacqueline Ferrell, female    DOB: 04/20/1966, 48 y.o.   MRN: [id number]  Back Pain This is a chronic problem. The current episode started more than 1 year ago. The problem occurs intermittently. The problem has been gradually worsening since onset. The pain is present in the lumbar spine. The quality of the pain is described as aching. The pain radiates to the left thigh and right thigh. The pain is at a severity of 6/10. The pain is moderate. The pain is worse during the day. The symptoms are aggravated by bending and position. Associated symptoms include leg pain, numbness and paresthesias (N/T in BLE). Pertinent negatives include no abdominal pain, bladder incontinence, bowel incontinence, chest pain, dysuria, fever, headaches, paresis, pelvic pain, perianal numbness, tingling, weakness or weight loss. Risk factors include obesity and lack of exercise. She has tried NSAIDs for the symptoms. The treatment provided mild relief.      Review of Systems  Constitutional: Negative.  Negative for fever, chills, weight loss, diaphoresis, appetite change and fatigue.  HENT: Negative.   Eyes: Negative.   Respiratory: Negative.  Negative for cough, choking, chest tightness, shortness of breath, wheezing and stridor.   Cardiovascular: Negative.  Negative for chest pain, palpitations and leg swelling.  Gastrointestinal: Negative.  Negative for nausea, vomiting, abdominal pain, diarrhea, constipation, blood in stool and bowel incontinence.  Endocrine: Negative.  Negative for polydipsia, polyphagia and polyuria.  Genitourinary: Negative.  Negative for bladder incontinence, dysuria and pelvic pain.  Musculoskeletal: Positive for arthralgias (bilateral knees and ankles) and back pain. Negative for gait problem, joint swelling, myalgias, neck pain and neck stiffness.  Skin: Negative.  Negative for rash.  Allergic/Immunologic: Negative.   Neurological: Positive for numbness and  paresthesias (N/T in BLE). Negative for tingling, weakness and headaches.  Hematological: Negative.  Negative for adenopathy. Does not bruise/bleed easily.  Psychiatric/Behavioral: Negative.        Objective:   Physical Exam  Vitals reviewed. Constitutional: She is oriented to person, place, and time. She appears well-developed and well-nourished. No distress.  HENT:  Head: Normocephalic and atraumatic.  Mouth/Throat: Oropharynx is clear and moist. No oropharyngeal exudate.  Eyes: Conjunctivae are normal. Right eye exhibits no discharge. Left eye exhibits no discharge. No scleral icterus.  Neck: Normal range of motion. Neck supple. No JVD present. No tracheal deviation present. No thyromegaly present.  Cardiovascular: Normal rate, regular rhythm, normal heart sounds and intact distal pulses.  Exam reveals no gallop and no friction rub.   No murmur heard. Pulmonary/Chest: Effort normal and breath sounds normal. No stridor. No respiratory distress. She has no wheezes. She has no rales. She exhibits no tenderness.  Abdominal: Soft. Bowel sounds are normal. She exhibits no distension and no mass. There is no tenderness. There is no rebound and no guarding.  Musculoskeletal: Normal range of motion. She exhibits edema. She exhibits no tenderness.       Right ankle: She exhibits deformity (DJD).       Left ankle: She exhibits deformity (DJD).       Lumbar back: Normal. She exhibits normal range of motion, no tenderness, no bony tenderness, no swelling, no edema, no deformity, no laceration, no pain, no spasm and normal pulse.  Neg SLR in BLE  Lymphadenopathy:    She has no cervical adenopathy.  Neurological: She is oriented to person, place, and time.  Skin: Skin is warm and dry. No rash noted. She is not diaphoretic. No erythema.  No pallor.  Psychiatric: She has a normal mood and affect. Her behavior is normal. Judgment and thought content normal.     Lab Results  Component Value Date    WBC 7.8 09/13/2013   HGB 13.0 09/13/2013   HCT 38.7 09/13/2013   PLT 231.0 09/13/2013   GLUCOSE 79 09/13/2013   CHOL 281* 09/13/2013   TRIG 84.0 09/13/2013   HDL 36.90* 09/13/2013   LDLCALC 227* 09/13/2013   ALT 22 09/13/2013   AST 17 09/13/2013   NA 135 09/13/2013   K 3.5 09/13/2013   CL 104 09/13/2013   CREATININE 0.8 09/13/2013   BUN 22 09/13/2013   CO2 23 09/13/2013   TSH 1.97 09/13/2013   INR 1.1* 09/11/2010   HGBA1C 6.2 12/29/2011       Assessment & Plan:

## 2014-03-12 ENCOUNTER — Encounter: Payer: Self-pay | Admitting: Internal Medicine

## 2014-04-03 ENCOUNTER — Encounter: Payer: Self-pay | Admitting: Internal Medicine

## 2014-04-03 ENCOUNTER — Other Ambulatory Visit: Payer: Self-pay | Admitting: Internal Medicine

## 2014-04-03 ENCOUNTER — Ambulatory Visit
Admission: RE | Admit: 2014-04-03 | Discharge: 2014-04-03 | Disposition: A | Discharge status: Home / Self Care | Payer: Managed Care, Other (non HMO) | Source: Ambulatory Visit | Attending: Internal Medicine | Admitting: Internal Medicine

## 2014-04-03 DIAGNOSIS — M48061 Spinal stenosis, lumbar region without neurogenic claudication: Secondary | ICD-10-CM

## 2014-04-03 DIAGNOSIS — M5440 Lumbago with sciatica, unspecified side: Secondary | ICD-10-CM

## 2014-04-09 ENCOUNTER — Encounter (HOSPITAL_COMMUNITY): Payer: Self-pay | Admitting: Emergency Medicine

## 2014-04-09 ENCOUNTER — Emergency Department (HOSPITAL_COMMUNITY)
Admission: EM | Admit: 2014-04-09 | Discharge: 2014-04-09 | Disposition: A | Discharge status: Home / Self Care | Payer: Managed Care, Other (non HMO) | Attending: Emergency Medicine | Admitting: Emergency Medicine

## 2014-04-09 ENCOUNTER — Emergency Department (HOSPITAL_COMMUNITY): Payer: Managed Care, Other (non HMO)

## 2014-04-09 DIAGNOSIS — Z7982 Long term (current) use of aspirin: Secondary | ICD-10-CM | POA: Insufficient documentation

## 2014-04-09 DIAGNOSIS — I2510 Atherosclerotic heart disease of native coronary artery without angina pectoris: Secondary | ICD-10-CM | POA: Insufficient documentation

## 2014-04-09 DIAGNOSIS — Z87442 Personal history of urinary calculi: Secondary | ICD-10-CM | POA: Diagnosis not present

## 2014-04-09 DIAGNOSIS — Z9861 Coronary angioplasty status: Secondary | ICD-10-CM | POA: Insufficient documentation

## 2014-04-09 DIAGNOSIS — E785 Hyperlipidemia, unspecified: Secondary | ICD-10-CM | POA: Insufficient documentation

## 2014-04-09 DIAGNOSIS — E669 Obesity, unspecified: Secondary | ICD-10-CM | POA: Diagnosis not present

## 2014-04-09 DIAGNOSIS — R079 Chest pain, unspecified: Secondary | ICD-10-CM | POA: Insufficient documentation

## 2014-04-09 DIAGNOSIS — I10 Essential (primary) hypertension: Secondary | ICD-10-CM | POA: Diagnosis not present

## 2014-04-09 DIAGNOSIS — Z79899 Other long term (current) drug therapy: Secondary | ICD-10-CM | POA: Insufficient documentation

## 2014-04-09 DIAGNOSIS — R0602 Shortness of breath: Secondary | ICD-10-CM | POA: Diagnosis not present

## 2014-04-09 DIAGNOSIS — R0789 Other chest pain: Secondary | ICD-10-CM

## 2014-04-09 LAB — COMPREHENSIVE METABOLIC PANEL
ALT: 24 U/L (ref 0–35)
ANION GAP: 15 (ref 5–15)
AST: 35 U/L (ref 0–37)
Albumin: 3.9 g/dL (ref 3.5–5.2)
Alkaline Phosphatase: 57 U/L (ref 39–117)
BUN: 18 mg/dL (ref 6–23)
CALCIUM: 9.4 mg/dL (ref 8.4–10.5)
CO2: 22 meq/L (ref 19–32)
Chloride: 101 mEq/L (ref 96–112)
Creatinine, Ser: 0.8 mg/dL (ref 0.50–1.10)
GFR calc Af Amer: >90 mL/min (ref >90)
GFR, EST NON AFRICAN AMERICAN: 86 mL/min — AB (ref >90)
GLUCOSE: 91 mg/dL (ref 70–99)
Potassium: 5 mEq/L (ref 3.7–5.3)
Sodium: 138 mEq/L (ref 137–147)
TOTAL PROTEIN: 7.6 g/dL (ref 6.0–8.3)
Total Bilirubin: 0.4 mg/dL (ref 0.3–1.2)

## 2014-04-09 LAB — CBC
HCT: 37.3 % (ref 36.0–46.0)
Hemoglobin: 12.8 g/dL (ref 12.0–15.0)
MCH: 30.2 pg (ref 26.0–34.0)
MCHC: 34.3 g/dL (ref 30.0–36.0)
MCV: 88 fL (ref 78.0–100.0)
PLATELETS: 225 10*3/uL (ref 150–400)
RBC: 4.24 MIL/uL (ref 3.87–5.11)
RDW: 13 % (ref 11.5–15.5)
WBC: 7.6 10*3/uL (ref 4.0–10.5)

## 2014-04-09 LAB — TROPONIN I
Troponin I: <0.3 ng/mL (ref <0.30)
Troponin I: <0.3 ng/mL (ref <0.30)

## 2014-04-09 LAB — PRO B NATRIURETIC PEPTIDE: Pro B Natriuretic peptide (BNP): 19.4 pg/mL (ref 0–125)

## 2014-04-09 MED ORDER — NITROGLYCERIN 0.4 MG SL SUBL: 0.4000 mg | SUBLINGUAL_TABLET | SUBLINGUAL | Status: DC | PRN

## 2014-04-09 MED ORDER — ASPIRIN 325 MG PO TABS
325.0000 mg | ORAL_TABLET | ORAL | Status: AC
  Administered 2014-04-09: 325 mg via ORAL
  Filled 2014-04-09: qty 1

## 2014-04-09 MED ORDER — ALPRAZOLAM 0.25 MG PO TABS: 0.2500 mg | ORAL_TABLET | Freq: Two times a day (BID) | ORAL | Status: DC | PRN

## 2014-04-09 MED ORDER — NITROGLYCERIN 0.3 MG SL SUBL: 0.3000 mg | SUBLINGUAL_TABLET | SUBLINGUAL | Status: DC | PRN

## 2014-04-09 NOTE — ED Notes (Signed)
Pt denies chest pain at the time. Vital signs stable. Respirations unlabored. Remains on cardiac monitor. No signs of acute distress noted. Family at bedside.

## 2014-04-09 NOTE — ED Notes (Signed)
Pt reports CP that started last night after walking up some stairs and became very short of breath. sts normally when she gets CP once she rests the pain is relieved but she went to sleep and woke up and still having the pain. Denies taking any nitro, sts her bottle was expired. Hx of 2 stents, HTN and CAD. sts she is still having the pain. Nad, skin warm and dry, resp e/u.

## 2014-04-09 NOTE — ED Notes (Signed)
Pt reports having CP last night, continued today; reports SOB with exertion and radiation to L shoulder blade; denies n/v

## 2014-04-09 NOTE — ED Notes (Signed)
Unable to gain peripheral access with ultrasound. EDP aware.

## 2014-04-09 NOTE — Discharge Instructions (Signed)

## 2014-04-09 NOTE — ED Provider Notes (Signed)
CSN: [id number]     Arrival date & time 04/09/14  1431 History   First MD Initiated Contact with Patient 04/09/14 1524     Chief Complaint  Patient presents with  . Chest Pain     (Consider location/radiation/quality/duration/timing/severity/associated sxs/prior Treatment) Patient is a 48 y.o. female presenting with chest pain. The history is provided by the patient.  Chest Pain Pain location:  Unable to specify Pain quality: sharp   Pain quality comment:  Sometimes feels a cramping pain Pain radiates to:  Does not radiate Pain radiates to the back: no (mild tightness in left shoulder blade)   Pain severity now: mild to mod. Onset quality:  Sudden Duration:  1 day Timing:  Intermittent Progression:  Worsening Chronicity:  Recurrent Context: at rest   Relieved by:  Nothing Worsened by:  Nothing tried Ineffective treatments:  None tried Associated symptoms: shortness of breath   Associated symptoms: no abdominal pain, no back pain, no cough, no dizziness, no fatigue, no fever, no headache, no nausea and not vomiting     Past Medical History  Diagnosis Date  . Hypertension   . Nephrolithiasis   . Hyperlipidemia   . Obese   . Chest pain, exertional     lexiscan myoview in 1/12 with EF 52% and a small reversible defect in diagonal territory either due to ischemia or shifting breast attenuation. ECHO 50-55%, mild LAE, mild LVH, no significant valvular abnormalities  . Coronary artery disease   . Poor compliance with medication    Past Surgical History  Procedure Laterality Date  . Abdominal hysterectomy  2009  . Coronary angioplasty with stent placement     Family History  Problem Relation Age of Onset  . Heart attack Father 36    MI  . Heart disease Father   . Hypertension Other   . Hyperlipidemia Other   . Heart attack Cousin     5 first cousins with MI's in their 68s  . Coronary artery disease Neg Hx   . Stroke Neg Hx   . Alcohol abuse Neg Hx   . Cancer Neg Hx    . Diabetes Neg Hx   . Early death Neg Hx   . Hypertension Mother   . Hyperlipidemia Sister   . Hypertension Brother   . Hypertension Brother    History  Substance Use Topics  . Smoking status: Never Smoker   . Smokeless tobacco: Never Used  . Alcohol Use: No     Comment: occasional   OB History   Grav Para Term Preterm Abortions TAB SAB Ect Mult Living                 Review of Systems  Constitutional: Negative for fever and fatigue.  HENT: Negative for congestion and drooling.   Eyes: Negative for pain.  Respiratory: Positive for shortness of breath. Negative for cough.   Cardiovascular: Positive for chest pain.  Gastrointestinal: Negative for nausea, vomiting, abdominal pain and diarrhea.  Genitourinary: Negative for dysuria and hematuria.  Musculoskeletal: Negative for back pain, gait problem and neck pain.  Skin: Negative for color change.  Neurological: Negative for dizziness and headaches.  Hematological: Negative for adenopathy.  Psychiatric/Behavioral: Negative for behavioral problems.  All other systems reviewed and are negative.     Allergies  Lisinopril  Home Medications   Prior to Admission medications   Medication Sig Start Date End Date Taking? Authorizing Provider  aspirin 81 MG EC tablet Take 81 mg by mouth daily.  Historical Provider, MD  atorvastatin (LIPITOR) 80 MG tablet Take 1 tablet (80 mg total) by mouth daily. 02/13/14   Janith Lima, MD  Cholecalciferol (VITAMIN D3) 1000 UNITS tablet Take 1,000 Units by mouth daily.      Historical Provider, MD  losartan-hydrochlorothiazide (HYZAAR) 100-25 MG per tablet Take 1 tablet by mouth daily. 02/13/14 05/16/15  Janith Lima, MD  Omega-3 350 MG CAPS Take 1 capsule by mouth daily.      Historical Provider, MD   BP 172/95  Pulse 69  Temp(Src) 98 F (36.7 C) (Oral)  Resp 18  Ht 5\' 7"  (1.702 m)  Wt 333 lb (151.048 kg)  BMI 52.14 kg/m2  SpO2 97%  LMP 02/10/2008 Physical Exam  Nursing note and  vitals reviewed. Constitutional: She is oriented to person, place, and time. She appears well-developed and well-nourished.  HENT:  Head: Normocephalic.  Mouth/Throat: No oropharyngeal exudate.  Eyes: Conjunctivae and EOM are normal. Pupils are equal, round, and reactive to light.  Neck: Normal range of motion. Neck supple.  Cardiovascular: Normal rate, regular rhythm, normal heart sounds and intact distal pulses.  Exam reveals no gallop and no friction rub.   No murmur heard. Pulmonary/Chest: Effort normal and breath sounds normal. No respiratory distress. She has no wheezes.  Abdominal: Soft. Bowel sounds are normal. There is no tenderness. There is no rebound and no guarding.  Musculoskeletal: Normal range of motion. She exhibits edema (mild pitting edema in ankles bilaterally). She exhibits no tenderness.  Neurological: She is alert and oriented to person, place, and time.  Skin: Skin is warm and dry.  Psychiatric: She has a normal mood and affect. Her behavior is normal.    ED Course  Procedures (including critical care time) Labs Review Labs Reviewed  COMPREHENSIVE METABOLIC PANEL - Abnormal; Notable for the following:    GFR calc non Af Amer 86 (*)    All other components within normal limits  CBC  PRO B NATRIURETIC PEPTIDE  TROPONIN I  TROPONIN I    Imaging Review Dg Chest 2 View  04/09/2014   CLINICAL DATA:  Chest pain.  EXAM: CHEST  2 VIEW  COMPARISON:  September 13, 2013.  FINDINGS: The heart size and mediastinal contours are within normal limits. Both lungs are clear. No pneumothorax or pleural effusion is noted. The visualized skeletal structures are unremarkable.  IMPRESSION: No acute cardiopulmonary abnormality seen.   Electronically Signed   By: Sabino Dick M.D.   On: 04/09/2014 15:19     EKG Interpretation   Date/Time:  Tuesday April 09 2014 14:38:13 EDT Ventricular Rate:  84 PR Interval:  152 QRS Duration: 86 QT Interval:  348 QTC Calculation: 411 R Axis:    -9 Text Interpretation:  Normal sinus rhythm Low voltage QRS No significant  change since last tracing Confirmed by Alaine Loughney  MD, Denecia Brunette ([id number]) on  04/09/2014 6:30:39 PM      MDM   Final diagnoses:  Atypical chest pain    3:38 PM 48 y.o. female w hx of HTN, HLP, CAD s/p PCI who pw cp. She has had a heart catheterization which showed 80% proximal LAD stenosis and a stent was placed in March of 2012. She states that she has had intermittent sharp fleeting chest pains in her chest over the last few weeks which she believes has been worsened by a difficult family situation with her son. She notes that she typically has worsening chest pain with stress. She states she began feeling  sporadic fleeting sharp chest pains yesterday while at work. She began feeling them again when she woke up this morning around noon and states that after walking up 2 flights of stairs she felt short of breath and had worsening of the fleeting chest pains. She currently has 5/10 chest pain on exam. She is afebrile and mildly hypertensive here. She has not taken anything for the pain. Will get screening labs, imaging, aspirin, and nitroglycerin.   9:27 PM: I interpreted/reviewed the labs and/or imaging which were non-contributory.  4 hr delta trop neg.   Pt has been asx for several hours. CP is atypical and her sx may be related to inc stress. Will provide Rx for small amt of xanax. Given non-contrib workup I think it is reasonable to d/c her home and have her f/u w/ her cardiologist. I have discussed the diagnosis/risks/treatment options with the patient and family and believe the pt to be eligible for discharge home to follow-up with her cardiologist. We also discussed returning to the ED immediately if new or worsening sx occur. We discussed the sx which are most concerning (e.g., return of cp, sob, ) that necessitate immediate return. Medications administered to the patient during their visit and any new prescriptions  provided to the patient are listed below.  Medications given during this visit Medications  nitroGLYCERIN (NITROSTAT) SL tablet 0.4 mg (not administered)  aspirin tablet 325 mg (325 mg Oral Given 04/09/14 1654)    New Prescriptions   ALPRAZOLAM (XANAX) 0.25 MG TABLET    Take 1 tablet (0.25 mg total) by mouth 2 (two) times daily as needed for anxiety.     Pamella Pert, MD 04/09/14 2131

## 2014-04-09 NOTE — ED Notes (Addendum)
Attempted to gain IV access and draw blood, unsuccessful.

## 2014-05-28 ENCOUNTER — Encounter: Payer: Self-pay | Admitting: Physical Medicine & Rehabilitation

## 2014-06-21 ENCOUNTER — Other Ambulatory Visit: Payer: Self-pay | Admitting: Physical Medicine & Rehabilitation

## 2014-06-21 ENCOUNTER — Encounter: Payer: Self-pay | Admitting: Physical Medicine & Rehabilitation

## 2014-06-21 ENCOUNTER — Ambulatory Visit (HOSPITAL_BASED_OUTPATIENT_CLINIC_OR_DEPARTMENT_OTHER): Payer: Managed Care, Other (non HMO) | Admitting: Physical Medicine & Rehabilitation

## 2014-06-21 ENCOUNTER — Encounter: Payer: Managed Care, Other (non HMO) | Attending: Physical Medicine & Rehabilitation

## 2014-06-21 VITALS — BP 154/70 | HR 62 | Resp 14 | Ht 67.0 in | Wt 335.0 lb

## 2014-06-21 DIAGNOSIS — M47816 Spondylosis without myelopathy or radiculopathy, lumbar region: Secondary | ICD-10-CM

## 2014-06-21 DIAGNOSIS — G8928 Other chronic postprocedural pain: Secondary | ICD-10-CM

## 2014-06-21 DIAGNOSIS — G894 Chronic pain syndrome: Secondary | ICD-10-CM | POA: Insufficient documentation

## 2014-06-21 DIAGNOSIS — Z79899 Other long term (current) drug therapy: Secondary | ICD-10-CM

## 2014-06-21 DIAGNOSIS — Z5181 Encounter for therapeutic drug level monitoring: Secondary | ICD-10-CM

## 2014-06-21 NOTE — Progress Notes (Signed)
Subjective:    Patient ID: Jacqueline Ferrell, female    DOB: 1965/11/06, 48 y.o.   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 This was reviewed with the patient including viewing the images  Pain Inventory Average Pain 6 Pain Right Now 4 My pain is constant, dull and aching  In the last 24 hours, has pain interfered with the following? General activity 10 Relation with others 3 Enjoyment of life 10 What TIME of day is your pain at its worst? morning Sleep (in general) Poor  Pain is worse with: walking, bending, sitting and standing Pain improves with: heat/ice and medication Relief from Meds: 4  Mobility walk without assistance how many minutes can you walk? 30 ability to climb steps?  yes do you drive?  yes  Function employed # of hrs/week 36  Neuro/Psych bladder control problems numbness trouble walking  Prior Studies Any changes since last visit?  no  Physicians involved in your care Any changes since last visit?  no   Family History  Problem Relation Age of Onset  . Heart attack Father 49    MI  . Heart disease Father   . Hypertension Other   . Hyperlipidemia Other   . Heart attack Cousin     5 first cousins with MI's in their 36s  . Coronary artery disease Neg Hx   . Stroke Neg Hx   . Alcohol abuse Neg Hx   . Cancer Neg Hx   . Diabetes Neg Hx   . Early death Neg Hx   . Hypertension Mother   . Hyperlipidemia Sister   . Hypertension Brother   . Hypertension Brother    History   Social History  .  Marital Status: Married    Spouse Name: N/A    Number of Children: N/A  . Years of Education: N/A   Occupational History  . LPN     works at [employer]  . Smoking status: Never Smoker   . Smokeless tobacco: Never Used  . Alcohol Use: No     Comment: occasional  . Drug Use: No  . Sexual Activity: Not Currently   Other Topics Concern  . None   Social History Narrative   Married   Lives in [place]   Does not get regular exercise   Past Surgical History  Procedure Laterality Date  . Abdominal hysterectomy  2009  . Coronary angioplasty with stent placement     Past Medical History  Diagnosis Date  . Hypertension   . Nephrolithiasis   . Hyperlipidemia   . Obese   . Chest pain, exertional     lexiscan myoview in 1/12 with EF 52% and a small reversible defect in diagonal territory either due to ischemia or shifting breast attenuation. ECHO 50-55%, mild LAE, mild LVH, no significant valvular abnormalities  . Coronary artery disease   . Poor compliance with medication    BP 154/70 mmHg  Pulse 62  Resp 14  Ht 5\' 7"  (1.702 m)  Wt 335 lb (151.955 kg)  BMI 52.46 kg/m2  SpO2 100%  LMP 02/10/2008  Opioid Risk Score:   Fall Risk Score: Low Fall Risk (0-5 points)  Review of Systems  Constitutional: Negative.   HENT: Negative.   Eyes: Negative.   Respiratory: Negative.   Cardiovascular: Negative.   Gastrointestinal: Negative.        Bowel problems  Endocrine: Negative.   Genitourinary: Negative.   Musculoskeletal: Positive for myalgias and back pain.  Allergic/Immunologic: Negative.   Neurological: Positive for numbness.       Trouble walking  Hematological: Negative.   Psychiatric/Behavioral: Negative.        Objective:   Physical Exam  Constitutional: She is oriented to person, place, and time. She appears well-developed and well-nourished.  HENT:  Head: Normocephalic and atraumatic.  Eyes: Conjunctivae and EOM are normal.  Pupils are equal, round, and reactive to light.  Musculoskeletal:       Right hip: She exhibits decreased range of motion.       Left hip: She exhibits decreased range of motion.       Right knee: She exhibits normal range of motion, no swelling and no effusion. No tenderness found.       Left knee: She exhibits normal range of motion, no swelling and no effusion. No tenderness found.       Cervical back: She exhibits normal range of motion, no tenderness and no deformity.       Thoracic back: She exhibits normal range of motion, no tenderness and no deformity.       Lumbar back: She exhibits decreased range of motion and tenderness. She exhibits no deformity.  Negative straight leg raising test  Motor strength 5/5 bilateral hip flexors knee extensors ankle dorsiflexor plantar flexor  Lumbar spine range of motion normal forward flexion but pain and limited range of motion with extension as well as pain with left lateral bending greater than right lateral bending  Neurological: She is alert and oriented to person, place, and time.  Psychiatric: She has a normal mood and affect.  Nursing note and vitals reviewed.         Assessment & Plan:  #1. Lumbar spondylosis with facet joint fluid L4-L5 may benefit from injection either intra-articular or medial branch blocks at corresponding levels. Also has facet joint arthropathy at L5-S1 Pain is mainly with extension. Patient also has some lumbar degenerative disc at L4-5 L5-S1 although this does not appear to be the major pain generator No signs of radiculopathy or neurogenic claudication  Referral to physical therapy Discussed medication management some limitations to nonsteroidals given coronary artery disease. We discussed that any nonsteroidal can cause some increased risk of thrombosis although prescription strength over-the-counter such as naproxen are relatively benign  She doesn't do well with narcotics due to severe constipation,  tramadol does not seem to be very helpful. Tylenol is an option for her and this may do better for her long-term  We'll set her up for the medial branch blocks as well  2. Bilateral lower extremity pain chronic postop pain from childhood surgeries has chronic bilateral ankle swelling. No evidence of significant knee osteoarthritis. May have some hip Oster arthritis given limitation range of motion but this also could be just soft tissue contracture.

## 2014-06-22 LAB — PMP ALCOHOL METABOLITE (ETG)

## 2014-06-26 LAB — BENZODIAZEPINES (GC/LC/MS), URINE
ALPRAZOLAMU: 64 ng/mL — AB (ref <25)
CLONAZEPAU: NEGATIVE ng/mL (ref <25)
FLURAZEPAMU: NEGATIVE ng/mL (ref <50)
Lorazepam (GC/LC/MS), ur confirm: NEGATIVE ng/mL (ref <50)
MIDAZOLAMU: NEGATIVE ng/mL (ref <50)
Nordiazepam (GC/LC/MS), ur confirm: NEGATIVE ng/mL (ref <50)
Oxazepam (GC/LC/MS), ur confirm: NEGATIVE ng/mL (ref <50)
TEMAZEPAMU: NEGATIVE ng/mL (ref <50)
Triazolam metabolite (GC/LC/MS), ur confirm: NEGATIVE ng/mL (ref <50)

## 2014-06-26 LAB — ETHYL GLUCURONIDE, URINE
ETGU: 635 ng/mL — AB (ref <500)
Ethyl Sulfate (ETS): 270 ng/mL — ABNORMAL HIGH (ref <100)

## 2014-06-27 LAB — PRESCRIPTION MONITORING PROFILE (SOLSTAS)
Amphetamine/Meth: NEGATIVE ng/mL
Barbiturate Screen, Urine: NEGATIVE ng/mL
Buprenorphine, Urine: NEGATIVE ng/mL
COCAINE METABOLITES: NEGATIVE ng/mL
Cannabinoid Scrn, Ur: NEGATIVE ng/mL
Carisoprodol, Urine: NEGATIVE ng/mL
Creatinine, Urine: 500.34 mg/dL (ref >20.0)
Fentanyl, Ur: NEGATIVE ng/mL
MDMA URINE: NEGATIVE ng/mL
METHADONE SCREEN, URINE: NEGATIVE ng/mL
Meperidine, Ur: NEGATIVE ng/mL
NITRITES URINE, INITIAL: NEGATIVE ug/mL
Opiate Screen, Urine: NEGATIVE ng/mL
Oxycodone Screen, Ur: NEGATIVE ng/mL
Propoxyphene: NEGATIVE ng/mL
Tapentadol, urine: NEGATIVE ng/mL
Tramadol Scrn, Ur: NEGATIVE ng/mL
ZOLPIDEM, URINE: NEGATIVE ng/mL
pH, Initial: 5.2 pH (ref 4.5–8.9)

## 2014-07-09 ENCOUNTER — Ambulatory Visit: Payer: Managed Care, Other (non HMO) | Attending: Physical Medicine & Rehabilitation | Admitting: Physical Therapy

## 2014-07-09 DIAGNOSIS — G894 Chronic pain syndrome: Secondary | ICD-10-CM

## 2014-07-09 DIAGNOSIS — M545 Low back pain: Secondary | ICD-10-CM | POA: Insufficient documentation

## 2014-07-09 NOTE — Therapy (Signed)
[place], [place], [address] Phone: [phone]   Fax:  [phone]  Physical Therapy Evaluation  Patient Details  Name: Jacqueline Ferrell MRN: [id number] Date of Birth: 1965/08/20  Encounter Date: 07/09/2014      PT End of Session - 07/09/14 1143    Visit Number 1   Number of Visits 16   Date for PT Re-Evaluation 09/03/14   PT Start Time 1100   PT Stop Time 1152   PT Time Calculation (min) 52 min   Activity Tolerance Patient limited by pain      Past Medical History  Diagnosis Date  . Hypertension   . Nephrolithiasis   . Hyperlipidemia   . Obese   . Chest pain, exertional     lexiscan myoview in 1/12 with EF 52% and a small reversible defect in diagonal territory either due to ischemia or shifting breast attenuation. ECHO 50-55%, mild LAE, mild LVH, no significant valvular abnormalities  . Coronary artery disease   . Poor compliance with medication     Past Surgical History  Procedure Laterality Date  . Abdominal hysterectomy  2009  . Coronary angioplasty with stent placement      LMP 02/10/2008  Visit Diagnosis:  Chronic pain syndrome - Plan: PT plan of care cert/re-cert  Bilateral low back pain, with sciatica presence unspecified - Plan: PT plan of care cert/re-cert      Subjective Assessment - 07/09/14 1110    Symptoms Worsened pain in last 4-5 months ago in low back and leg pain.  Pain down the backs of thighs.    Oral meds only.  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  2 heart stints 2012 for CAD (dietary); obesity   Limitations Standing;Walking;Sitting   How long can you sit comfortably? 20-30 min   How long can you walk comfortably? 1 1/2  hours to distrubute meds at the jail   Diagnostic tests MRI arthritis   Patient Stated Goals I want to be more active than I am.  I have gym membership [employer] pool);  but it hurts to bad to go so I don't.  want to do things with adopted grandson (35 years old)   Currently in Pain? Yes   Pain Score 4    Pain Location Back   Pain Orientation Lower;Right;Left   Pain Descriptors / Indicators Constant   Pain Type Chronic pain   Aggravating Factors  standing   Pain Relieving Factors Lying down with lots of pillows, shifted to the right          [facility] PT Assessment - 07/09/14 1123    Assessment   Medical Diagnosis chronic pain syndrome   Onset Date --  4-5 months ago   Next MD Visit --  Not scheduled   Prior Therapy --  In 2011 with back injury   Precautions   Precautions None   Restrictions   Weight Bearing Restrictions No   Balance Screen   Has the patient fallen in the past 6 months No   Has the patient had a decrease in activity level because of a fear of falling?  Yes  fearful of falling in bath  Is the patient reluctant to leave their home because of a fear of falling?  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   Leisure --  go to the [employer] with adopted grandchildren   Observation/Other Assessments   Focus on Therapeutic Outcomes (FOTO)  --  not captured   Oswestry Disability Index  --  modified version 60% "severe disability"   Posture/Postural Control   Posture/Postural Control --  Stands with increased WB on right LE; decreased lumbar lordo    AROM   Lumbar Flexion 40   Lumbar Extension 12   Lumbar - Right Side Bend 15   Lumbar - Left Side Bend 30   Strength   Right Hip ABduction 4/5   Left Hip ABduction 4/5   Right Knee Extension 4/5   Left Knee Extension 4/5   Lumbar Flexion 3-/5   Lumbar Extension 3-/5   Flexibility   Hamstrings --  45 degrees bilaterally   Slump test   Findings Negative   Straight Leg Raise   Findings Negative                          PT Education - 07/09/14 1230    Education Details Abdominal brace   Person(s) Educated Patient   Methods Explanation;Demonstration;Tactile cues;Verbal cues;Handout   Comprehension Verbalized understanding;Returned demonstration          PT Short Term Goals - 07/09/14 1251    PT SHORT TERM GOAL #1   Title "Independent with initial HEP   Time 4   Period Weeks   Status New   PT SHORT TERM GOAL #2   Title "Demonstrate understanding of proper sitting posture and be more conscious of position and posture throughout the day.    Time 4   Period Weeks   Status New   PT SHORT TERM GOAL #3   Title HS muscle length improved to 55 degrees bilaterally needed for standing more erect and walking with less pain.   Time 4   Period Weeks   Status New   PT SHORT TERM GOAL #4   Title Modified Oswestry score improved from 60% to 50% indicating improved function with less pain.   Time 4   Period Weeks   Status New           PT Long Term Goals - 07/09/14 1331    PT LONG TERM GOAL #1   Title "Pt will be independent with advanced HEP and gym program.   Time 8   Period Weeks   Status New   PT LONG TERM GOAL #2   Title Patient will be able to walk 1000 feet in 6 minutes as needed for community ambulation, work duties and weight loss.   Time 8   Period Weeks   Status New   PT LONG TERM GOAL #3   Title Modified Oswestry score improved from 60% to 48% indicating improved function with less pain.   Time 8   Period Weeks   Status New  PT  LONG TERM GOAL #4   Title Lower abdominal and trunk extensor strength improved to at 4-/5 needed for standing longer periods of time at work, home and in the community (waiting in line)   Time 8   Period Weeks   Status New   PT LONG TERM GOAL #5   Title Patient will be able to lift light to medium weight object from counter height needed for home and work duties.     Time 8   Period Weeks   Status New               Plan - 07/09/14 1231    Clinical Impression Statement The patient is a 48 year old with a chronic history of low back over the last few years beginning when she fell off a rolling chair at work.  She reports she had "bulging discs in her back" and subsequently had a 30# weight gain.  She states over the last 4-5 months she has had a worsening of symptoms for no apparent reason.  She states her pain is on both sides of her low back and extends down the backs of her thighs, typically 4/10.  Her pain is aggravated with standing and she states she often leans forward because it feels better.  Her best position is lying down. 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  She states she is a member of a gym but doesn't go because it worsens her pain. Stands with increased right LE weightbearing, reduced lumbar lordosis.   Painful and limited lumbar AROM:  flex 40, ext 12, left sidebend 30, right sidebend 15.  Possible preference for flexion or neutral bias.  LE strength grossly 4/5 hip abd, quads.  Lower abdominals and trunk extensors 3-/5.  HS 45 degrees.  Negative SLR and slump test.  Modified Oswestry 60% "severe disability."     Pt will benefit from skilled therapeutic intervention in order to improve on the following deficits Pain;Decreased strength;Decreased activity tolerance;Decreased range of motion   Rehab Potential Good   PT Frequency 2x / week   PT Duration 8 weeks    PT Treatment/Interventions ADLs/Self Care Home Management;Cryotherapy;Electrical Stimulation;Moist Heat;Traction;Ultrasound;Therapeutic activities;Therapeutic exercise;Neuromuscular re-education;Patient/family education   PT Next Visit Plan Do 6 MWT; Progress abdominal bracing; HS stretching; ?Nu-Step; e-stim/heat; ?BERG secondary to fearfulness of falling         Problem List Patient Active Problem List   Diagnosis Date Noted  . Spinal stenosis of lumbar region 02/19/2014  . Morbid obesity 10/09/2013  . Routine general medical examination at a health care facility 09/13/2013  . Other screening mammogram 09/13/2013  . Hypokalemia 01/27/2012  . Onychomycosis of toenail 01/27/2012  . DJD (degenerative joint disease) of knee 01/27/2012  . Depression with anxiety 01/27/2012  . Obesity 12/24/2011  . OSA (obstructive sleep apnea) 12/24/2011  . Hypertension 01/04/2011  . CAD, NATIVE VESSEL 09/28/2010  . Hyperlipidemia with target LDL less than 70 07/28/2010  . Other abnormal glucose 07/28/2010  . Proteinuria 07/28/2010   Ruben Im, PT 07/09/2014 4:59 PM Phone: [phone] Fax: [phone] Alvera Singh 07/09/2014, 4:58 PM  [facility] [address] [place], [place], [address] Phone: [phone]   Fax:  [phone]

## 2014-07-09 NOTE — Patient Instructions (Signed)
Supine and sidelying abdominal brace 5 sec hold 3x/day

## 2014-07-14 ENCOUNTER — Other Ambulatory Visit: Payer: Self-pay | Admitting: Internal Medicine

## 2014-07-16 ENCOUNTER — Ambulatory Visit: Payer: Managed Care, Other (non HMO) | Attending: Physical Medicine & Rehabilitation | Admitting: Physical Therapy

## 2014-07-16 DIAGNOSIS — G894 Chronic pain syndrome: Secondary | ICD-10-CM

## 2014-07-16 DIAGNOSIS — M545 Low back pain: Secondary | ICD-10-CM | POA: Insufficient documentation

## 2014-07-16 NOTE — Patient Instructions (Signed)
.  dead bug

## 2014-07-16 NOTE — Therapy (Signed)
[place], [place], [address] Phone: [phone]   Fax:  [phone]  Physical Therapy Treatment  Patient Details  Name: Jacqueline Ferrell MRN: [id number] Date of Birth: Jan 29, 1966  Encounter Date: 07/16/2014      PT End of Session - 07/16/14 1814    Visit Number 2   Number of Visits 16   Date for PT Re-Evaluation 09/03/14   PT Start Time 1103   PT Stop Time 1149   PT Time Calculation (min) 46 min   Activity Tolerance Patient tolerated treatment well;Patient limited by pain;Patient limited by fatigue      Past Medical History  Diagnosis Date  . Hypertension   . Nephrolithiasis   . Hyperlipidemia   . Obese   . Chest pain, exertional     lexiscan myoview in 1/12 with EF 52% and a small reversible defect in diagonal territory either due to ischemia or shifting breast attenuation. ECHO 50-55%, mild LAE, mild LVH, no significant valvular abnormalities  . Coronary artery disease   . Poor compliance with medication     Past Surgical History  Procedure Laterality Date  . Abdominal hysterectomy  2009  . Coronary angioplasty with stent placement      LMP 02/10/2008  Visit Diagnosis:  Chronic pain syndrome  Bilateral low back pain, with sciatica presence unspecified      Subjective Assessment - 07/16/14 1109    Symptoms 4/10, Low back and leg.  legs more swollen today  (had surgery as a child and usually swell)  worked 12 hours on weekend.  normally does not have to be on her feet too much.   Pain Score 4    Pain Location Back   Pain Orientation Right;Lower   Pain Type Chronic pain   Aggravating Factors  more time on her feet   Pain Relieving Factors Ephraim Hamburger,  less time on her feet.                    [facility] PT Treatment/Exercise - 07/16/14 1121    Ambulation/Gait   Ambulation/Gait --  Timed up and go:  6:04 minutes walk/792.3 feet   Posture/Postural Control   Posture/Postural  Control --  pelvic tilt posterior helps posture, fatigues quickly.   Lumbar Exercises: Stretches   Passive Hamstring Stretch 3 reps;30 seconds   Single Knee to Chest Stretch 5 reps  Active assist.    Piriformis Stretch 3 reps;30 seconds  passive   Lumbar Exercises: Supine   Ab Set 5 reps   Bent Knee Raise 5 reps  2 sets   Dead Bug 5 reps  cramp lt hamstring.                PT Education - 07/16/14 1813    Education provided Yes   Education Details pelvic tilt and dead bug series, issued from drawer.   Person(s) Educated Patient   Methods Explanation;Demonstration;Verbal cues;Handout   Comprehension Verbalized understanding;Returned demonstration          PT Short Term Goals - 07/16/14 1817    PT SHORT TERM GOAL #1   Title "Independent with initial HEP   Time 4   Period Weeks   PT SHORT TERM GOAL #2   Title "Demonstrate understanding of proper sitting posture and be more conscious of position and posture throughout the day.    Time 4   Period Weeks   Status On-going   PT SHORT TERM GOAL #3  Title HS muscle length improved to 55 degrees bilaterally needed for standing more erect and walking with less pain.   Time 4   Period Weeks   Status On-going   PT SHORT TERM GOAL #4   Title Modified Oswestry score improved from 60% to 50% indicating improved function with less pain.   Time 4   Period Weeks   Status Unable to assess           PT Long Term Goals - 07/09/14 1331    PT LONG TERM GOAL #1   Title "Pt will be independent with advanced HEP and gym program.   Time 8   Period Weeks   Status New   PT LONG TERM GOAL #2   Title Patient will be able to walk 1000 feet in 6 minutes as needed for community ambulation, work duties and weight loss.   Time 8   Period Weeks   Status New   PT LONG TERM GOAL #3   Title Modified Oswestry score improved from 60% to 48% indicating improved function with less pain.   Time 8   Period Weeks   Status New   PT LONG  TERM GOAL #4   Title Lower abdominal and trunk extensor strength improved to at 4-/5 needed for standing longer periods of time at work, home and in the community (waiting in line)   Time 8   Period Weeks   Status New   PT LONG TERM GOAL #5   Title Patient will be able to lift light to medium weight object from counter height needed for home and work duties.     Time 8   Period Weeks   Status New               Plan - 07/16/14 1815    Clinical Impression Statement Patient's pain responded well to abdominal bracing.  6 minute walk test 792.3 feet /6:04 minutes. able to begin home exercise program.   PT Next Visit Plan ; Progress abdominal bracing; HS stretching; ?Nu-Step; e-stim/heat; ?BERG secondary to fearfulness of falling   Consulted and Agree with Plan of Care Patient        Problem List Patient Active Problem List   Diagnosis Date Noted  . Spinal stenosis of lumbar region 02/19/2014  . Morbid obesity 10/09/2013  . Routine general medical examination at a health care facility 09/13/2013  . Other screening mammogram 09/13/2013  . Hypokalemia 01/27/2012  . Onychomycosis of toenail 01/27/2012  . DJD (degenerative joint disease) of knee 01/27/2012  . Depression with anxiety 01/27/2012  . Obesity 12/24/2011  . OSA (obstructive sleep apnea) 12/24/2011  . Hypertension 01/04/2011  . CAD, NATIVE VESSEL 09/28/2010  . Hyperlipidemia with target LDL less than 70 07/28/2010  . Other abnormal glucose 07/28/2010  . Proteinuria 07/28/2010   Melvenia Needles, PTA 07/16/2014 6:19 PM Phone: [phone] Fax: [phone]  [facility] 07/16/2014, 6:19 PM  [place], [place], [address] Phone: [phone]   Fax:  [phone]

## 2014-07-17 NOTE — Progress Notes (Signed)
Urine drug screen for this encounter is positive for ETOH.  Presence of alprazolam metabolite is from a prescribed medication.

## 2014-07-18 ENCOUNTER — Ambulatory Visit: Payer: Managed Care, Other (non HMO) | Admitting: Physical Therapy

## 2014-07-23 ENCOUNTER — Ambulatory Visit: Payer: Managed Care, Other (non HMO) | Admitting: Physical Therapy

## 2014-07-23 DIAGNOSIS — G894 Chronic pain syndrome: Secondary | ICD-10-CM

## 2014-07-23 DIAGNOSIS — M545 Low back pain: Secondary | ICD-10-CM

## 2014-07-23 NOTE — Therapy (Signed)
[place], [place], [address] Phone: [phone]   Fax:  [phone]  Physical Therapy Treatment  Patient Details  Name: Jacqueline Ferrell MRN: [id number] Date of Birth: 1966/02/02 Referring Provider:  Janith Lima, MD  Encounter Date: 07/23/2014      PT End of Session - 07/23/14 1157    Visit Number 3   Number of Visits 16   Date for PT Re-Evaluation 09/03/14   PT Start Time 1106   PT Stop Time 1149   PT Time Calculation (min) 43 min   Activity Tolerance Patient tolerated treatment well      Past Medical History  Diagnosis Date  . Hypertension   . Nephrolithiasis   . Hyperlipidemia   . Obese   . Chest pain, exertional     lexiscan myoview in 1/12 with EF 52% and a small reversible defect in diagonal territory either due to ischemia or shifting breast attenuation. ECHO 50-55%, mild LAE, mild LVH, no significant valvular abnormalities  . Coronary artery disease   . Poor compliance with medication     Past Surgical History  Procedure Laterality Date  . Abdominal hysterectomy  2009  . Coronary angioplasty with stent placement      LMP 02/10/2008  Visit Diagnosis:  Chronic pain syndrome  Bilateral low back pain, with sciatica presence unspecified      Subjective Assessment - 07/23/14 1114    Symptoms no pain, fatigue, feels she is less swollen than it was, walks a little more, trying a little hardwe to be more active.  Less medications.  Feels tired nothing to do with her back.   Currently in Pain? No/denies   Pain Location Back   Pain Orientation Lower   Aggravating Factors  more time on her feet standing in line. (1 1/2 hour standing in line)   Pain Relieving Factors rest, Ben Gay ointment   Multiple Pain Sites No     Patient has fatigue today vs pain.                [facility] Adult PT Treatment/Exercise - 07/23/14 1120    Standardized Balance Assessment   Standardized  Balance Assessment Berg Balance Test   Berg Balance Test   Sit to Stand Able to stand without using hands and stabilize independently   Standing Unsupported Able to stand safely 2 minutes   Sitting with Back Unsupported but Feet Supported on Floor or Stool Able to sit safely and securely 2 minutes   Stand to Sit Sits safely with minimal use of hands   Transfers Able to transfer safely, minor use of hands   Standing Unsupported with Eyes Closed Able to stand 10 seconds safely   Standing Ubsupported with Feet Together Able to place feet together independently and stand 1 minute safely   From Standing, Reach Forward with Outstretched Arm Can reach forward >12 cm safely (5")   From Standing Position, Pick up Object from Floor Able to pick up shoe, needs supervision   From Standing Position, Turn to Look Behind Over each Shoulder Turn sideways only but maintains balance   Turn 360 Degrees Able to turn 360 degrees safely in 4 seconds or less   Standing Unsupported, Alternately Place Feet on Step/Stool Able to complete >2 steps/needs minimal assist   Standing Unsupported, One Foot in [employer] balance while stepping or standing   Standing on One Leg Tries to lift leg/unable to hold 3 seconds but remains standing  independently   Total Score 42   Lumbar Exercises: Stretches   Passive Hamstring Stretch 3 reps;30 seconds  43 degrees Lt, Rt not measured but visually about the same.                PT Education - 07/23/14 1156    Education provided No          PT Short Term Goals - 07/23/14 1201    PT SHORT TERM GOAL #1   Title "Independent with initial HEP   Time 4   Period Weeks   Status Unable to assess   PT SHORT TERM GOAL #2   Title "Demonstrate understanding of proper sitting posture and be more conscious of position and posture throughout the day.    Time 4   Period Weeks   Status On-going   PT SHORT TERM GOAL #3   Title HS muscle length improved to 55 degrees bilaterally  needed for standing more erect and walking with less pain.   Time 4   Period Weeks   Status On-going   PT SHORT TERM GOAL #4   Title Modified Oswestry score improved from 60% to 50% indicating improved function with less pain.   Time 4   Period Weeks   Status Unable to assess           PT Long Term Goals - 07/09/14 1331    PT LONG TERM GOAL #1   Title "Pt will be independent with advanced HEP and gym program.   Time 8   Period Weeks   Status New   PT LONG TERM GOAL #2   Title Patient will be able to walk 1000 feet in 6 minutes as needed for community ambulation, work duties and weight loss.   Time 8   Period Weeks   Status New   PT LONG TERM GOAL #3   Title Modified Oswestry score improved from 60% to 48% indicating improved function with less pain.   Time 8   Period Weeks   Status New   PT LONG TERM GOAL #4   Title Lower abdominal and trunk extensor strength improved to at 4-/5 needed for standing longer periods of time at work, home and in the community (waiting in line)   Time 8   Period Weeks   Status New   PT LONG TERM GOAL #5   Title Patient will be able to lift light to medium weight object from counter height needed for home and work duties.     Time 8   Period Weeks   Status New               Plan - 07/23/14 1158    Clinical Impression Statement BERG 42/56 will discuss with PT.  Patient says she would not use the walker if it was suggested, she has and would use a cane. No pain today    PT Next Visit Plan discuss BERG with PT, More exercise review Stabilization.   Consulted and Agree with Plan of Care Patient        Problem List Patient Active Problem List   Diagnosis Date Noted  . Spinal stenosis of lumbar region 02/19/2014  . Morbid obesity 10/09/2013  . Routine general medical examination at a health care facility 09/13/2013  . Other screening mammogram 09/13/2013  . Hypokalemia 01/27/2012  . Onychomycosis of toenail 01/27/2012  . DJD  (degenerative joint disease) of knee 01/27/2012  . Depression with anxiety 01/27/2012  . Obesity 12/24/2011  .  OSA (obstructive sleep apnea) 12/24/2011  . Hypertension 01/04/2011  . CAD, NATIVE VESSEL 09/28/2010  . Hyperlipidemia with target LDL less than 70 07/28/2010  . Other abnormal glucose 07/28/2010  . Proteinuria 07/28/2010   Melvenia Needles, PTA 07/23/2014 12:06 PM Phone: [phone] Fax: [phone]   [facility] 07/23/2014, 12:06 PM  [place], [place], [address] Phone: [phone]   Fax:  [phone]

## 2014-07-25 ENCOUNTER — Ambulatory Visit (HOSPITAL_BASED_OUTPATIENT_CLINIC_OR_DEPARTMENT_OTHER): Payer: Managed Care, Other (non HMO) | Admitting: Physical Medicine & Rehabilitation

## 2014-07-25 ENCOUNTER — Encounter: Payer: Self-pay | Admitting: Physical Medicine & Rehabilitation

## 2014-07-25 ENCOUNTER — Encounter: Payer: Managed Care, Other (non HMO) | Attending: Physical Medicine & Rehabilitation

## 2014-07-25 VITALS — BP 140/53 | HR 62 | Resp 14

## 2014-07-25 DIAGNOSIS — M47816 Spondylosis without myelopathy or radiculopathy, lumbar region: Secondary | ICD-10-CM

## 2014-07-25 DIAGNOSIS — Z79899 Other long term (current) drug therapy: Secondary | ICD-10-CM | POA: Insufficient documentation

## 2014-07-25 DIAGNOSIS — G894 Chronic pain syndrome: Secondary | ICD-10-CM | POA: Insufficient documentation

## 2014-07-25 MED ORDER — DIAZEPAM 5 MG PO TABS: 5.0000 mg | ORAL_TABLET | Freq: Once | ORAL | Status: DC

## 2014-07-25 NOTE — Progress Notes (Signed)
Bilateral Lumbar L3, L4  medial branch blocks and L 5 dorsal ramus injection under fluoroscopic guidance  Indication: Lumbar pain which is not relieved by medication management or other conservative care and interfering with self-care and mobility.  Informed consent was obtained after describing risks and benefits of the procedure with the patient, this includes bleeding, infection, paralysis and medication side effects.  The patient wishes to proceed and has given written consent.  The patient was placed in prone position.  The lumbar area was marked and prepped with Betadine.  One mL of 1% lidocaine was injected into each of 6 areas into the skin and subcutaneous tissue.  Then a 22-gauge 5in spinal needle was inserted targeting the junction of the left S1 superior articular process and sacral ala junction. Needle was advanced under fluoroscopic guidance.  Bone contact was made.  Omnipaque 180 was injected x 0.5 mL demonstrating no intravascular uptake.  Then a solution containing one mL of 4 mg per mL dexamethasone and 3 mL of 2% MPF lidocaine was injected x 0.5 mL.  Then the left L5 superior articular process in transverse process junction was targeted.  Bone contact was made.  Omnipaque 180 was injected x 0.5 mL demonstrating no intravascular uptake. Then a solution containing one mL of 4 mg per mL dexamethasone and 3 mL of 2% MPF lidocaine was injected x 0.5 mL.  Then the left L4 superior articular process in transverse process junction was targeted.  Bone contact was made.  Omnipaque 180 was injected x 0.5 mL demonstrating no intravascular uptake.  Then a solution containing one mL of 4 mg per mL dexamethasone and 3 mL if 2% MPF lidocaine was injected x 0.5 mL.  This same procedure was performed on the right side using the same needle, technique and injectate.  Patient tolerated procedure well.  Post procedure instructions were given. 

## 2014-07-25 NOTE — Progress Notes (Signed)
  PROCEDURE RECORD [facility]   Name: Jacqueline Ferrell DOB:15-Nov-1965 MRN: [id number]  Date:07/25/2014  Physician: Alysia Penna, MD    Nurse/CMA: MaryBeth Ginkel  Allergies:  Allergies  Allergen Reactions  . Lisinopril Other (See Comments)    REACTION: Tongue swelling    Consent Signed: Yes.    Is patient diabetic? No.  CBG today? .  Pregnant: No. LMP: Patient's last menstrual period was 02/10/2008. (age 48-55)  Anticoagulants: no Anti-inflammatory: no Antibiotics: no  Procedure: Position: Prone Start Time: 10:24am  End Time: 10:39am  Fluoro Time:48  RN/CMA Yvone Neu [place]as[place] MaryBeth Ginkel    Time 10:10 AM 10:41    BP 140/53 149/60    Pulse 62 60    Respirations 14 14    O2 Sat 96 100    S/S 6 6    Pain Level 3/10 2/10     D/C home with mother, patient A & O X 3, D/C instructions reviewed, and sits independently.

## 2014-07-25 NOTE — Patient Instructions (Signed)

## 2014-07-26 ENCOUNTER — Ambulatory Visit: Payer: Managed Care, Other (non HMO) | Admitting: Physical Therapy

## 2014-07-31 ENCOUNTER — Encounter: Payer: Self-pay | Admitting: Internal Medicine

## 2014-07-31 ENCOUNTER — Ambulatory Visit (INDEPENDENT_AMBULATORY_CARE_PROVIDER_SITE_OTHER): Payer: Managed Care, Other (non HMO) | Admitting: Internal Medicine

## 2014-07-31 VITALS — BP 126/88 | HR 75 | Temp 97.7°F | Resp 16 | Ht 67.0 in | Wt 334.0 lb

## 2014-07-31 DIAGNOSIS — K5909 Other constipation: Secondary | ICD-10-CM

## 2014-07-31 DIAGNOSIS — Z1231 Encounter for screening mammogram for malignant neoplasm of breast: Secondary | ICD-10-CM

## 2014-07-31 DIAGNOSIS — R739 Hyperglycemia, unspecified: Secondary | ICD-10-CM

## 2014-07-31 DIAGNOSIS — Z0000 Encounter for general adult medical examination without abnormal findings: Secondary | ICD-10-CM

## 2014-07-31 DIAGNOSIS — I10 Essential (primary) hypertension: Secondary | ICD-10-CM

## 2014-07-31 MED ORDER — POLYETHYLENE GLYCOL 3350 17 GM/SCOOP PO POWD: 17.0000 g | Freq: Two times a day (BID) | ORAL | Status: DC | PRN

## 2014-07-31 NOTE — Assessment & Plan Note (Signed)
Her BP is well controlled 

## 2014-07-31 NOTE — Assessment & Plan Note (Signed)
I will check her A1C to see if she has developed DM2 

## 2014-07-31 NOTE — Progress Notes (Deleted)
   Subjective:    Patient ID: Jacqueline Ferrell, female    DOB: 1966-04-30, 49 y.o.   MRN: [id number]  Hypertension      Review of Systems     Objective:   Physical Exam   Lab Results  Component Value Date   WBC 7.6 04/09/2014   HGB 12.8 04/09/2014   HCT 37.3 04/09/2014   PLT 225 04/09/2014   GLUCOSE 91 04/09/2014   CHOL 150 02/19/2014   TRIG 43.0 02/19/2014   HDL 30.20* 02/19/2014   LDLCALC 111* 02/19/2014   ALT 24 04/09/2014   AST 35 04/09/2014   NA 138 04/09/2014   K 5.0 04/09/2014   CL 101 04/09/2014   CREATININE 0.80 04/09/2014   BUN 18 04/09/2014   CO2 22 04/09/2014   TSH 3.03 02/19/2014   INR 1.1* 09/11/2010   HGBA1C 6.2 02/19/2014       Assessment & Plan:

## 2014-07-31 NOTE — Patient Instructions (Signed)
Preventive Care for Adults A healthy lifestyle and preventive care can promote health and wellness. Preventive health guidelines for women include the following key practices.  A routine yearly physical is a good way to check with your health care provider about your health and preventive screening. It is a chance to share any concerns and updates on your health and to receive a thorough exam.  Visit your dentist for a routine exam and preventive care every 6 months. Brush your teeth twice a day and floss once a day. Good oral hygiene prevents tooth decay and gum disease.  The frequency of eye exams is based on your age, health, family medical history, use of contact lenses, and other factors. Follow your health care provider's recommendations for frequency of eye exams.  Eat a healthy diet. Foods like vegetables, fruits, whole grains, low-fat dairy products, and lean protein foods contain the nutrients you need without too many calories. Decrease your intake of foods high in solid fats, added sugars, and salt. Eat the right amount of calories for you.Get information about a proper diet from your health care provider, if necessary.  Regular physical exercise is one of the most important things you can do for your health. Most adults should get at least 150 minutes of moderate-intensity exercise (any activity that increases your heart rate and causes you to sweat) each week. In addition, most adults need muscle-strengthening exercises on 2 or more days a week.  Maintain a healthy weight. The body mass index (BMI) is a screening tool to identify possible weight problems. It provides an estimate of body fat based on height and weight. Your health care provider can find your BMI and can help you achieve or maintain a healthy weight.For adults 20 years and older:  A BMI below 18.5 is considered underweight.  A BMI of 18.5 to 24.9 is normal.  A BMI of 25 to 29.9 is considered overweight.  A BMI of  30 and above is considered obese.  Maintain normal blood lipids and cholesterol levels by exercising and minimizing your intake of saturated fat. Eat a balanced diet with plenty of fruit and vegetables. Blood tests for lipids and cholesterol should begin at age 76 and be repeated every 5 years. If your lipid or cholesterol levels are high, you are over 50, or you are at high risk for heart disease, you may need your cholesterol levels checked more frequently.Ongoing high lipid and cholesterol levels should be treated with medicines if diet and exercise are not working.  If you smoke, find out from your health care provider how to quit. If you do not use tobacco, do not start.  Lung cancer screening is recommended for adults aged 22-80 years who are at high risk for developing lung cancer because of a history of smoking. A yearly low-dose CT scan of the lungs is recommended for people who have at least a 30-pack-year history of smoking and are a current smoker or have quit within the past 15 years. A pack year of smoking is smoking an average of 1 pack of cigarettes a day for 1 year (for example: 1 pack a day for 30 years or 2 packs a day for 15 years). Yearly screening should continue until the smoker has stopped smoking for at least 15 years. Yearly screening should be stopped for people who develop a health problem that would prevent them from having lung cancer treatment.  If you are pregnant, do not drink alcohol. If you are breastfeeding,  be very cautious about drinking alcohol. If you are not pregnant and choose to drink alcohol, do not have more than 1 drink per day. One drink is considered to be 12 ounces (355 mL) of beer, 5 ounces (148 mL) of wine, or 1.5 ounces (44 mL) of liquor.  Avoid use of street drugs. Do not share needles with anyone. Ask for help if you need support or instructions about stopping the use of drugs.  High blood pressure causes heart disease and increases the risk of  stroke. Your blood pressure should be checked at least every 1 to 2 years. Ongoing high blood pressure should be treated with medicines if weight loss and exercise do not work.  If you are 75-52 years old, ask your health care provider if you should take aspirin to prevent strokes.  Diabetes screening involves taking a blood sample to check your fasting blood sugar level. This should be done once every 3 years, after age 15, if you are within normal weight and without risk factors for diabetes. Testing should be considered at a younger age or be carried out more frequently if you are overweight and have at least 1 risk factor for diabetes.  Breast cancer screening is essential preventive care for women. You should practice "breast self-awareness." This means understanding the normal appearance and feel of your breasts and may include breast self-examination. Any changes detected, no matter how small, should be reported to a health care provider. Women in their 58s and 30s should have a clinical breast exam (CBE) by a health care provider as part of a regular health exam every 1 to 3 years. After age 16, women should have a CBE every year. Starting at age 53, women should consider having a mammogram (breast X-ray test) every year. Women who have a family history of breast cancer should talk to their health care provider about genetic screening. Women at a high risk of breast cancer should talk to their health care providers about having an MRI and a mammogram every year.  Breast cancer gene (BRCA)-related cancer risk assessment is recommended for women who have family members with BRCA-related cancers. BRCA-related cancers include breast, ovarian, tubal, and peritoneal cancers. Having family members with these cancers may be associated with an increased risk for harmful changes (mutations) in the breast cancer genes BRCA1 and BRCA2. Results of the assessment will determine the need for genetic counseling and  BRCA1 and BRCA2 testing.  Routine pelvic exams to screen for cancer are no longer recommended for nonpregnant women who are considered low risk for cancer of the pelvic organs (ovaries, uterus, and vagina) and who do not have symptoms. Ask your health care provider if a screening pelvic exam is right for you.  If you have had past treatment for cervical cancer or a condition that could lead to cancer, you need Pap tests and screening for cancer for at least 20 years after your treatment. If Pap tests have been discontinued, your risk factors (such as having a new sexual partner) need to be reassessed to determine if screening should be resumed. Some women have medical problems that increase the chance of getting cervical cancer. In these cases, your health care provider may recommend more frequent screening and Pap tests.  The HPV test is an additional test that may be used for cervical cancer screening. The HPV test looks for the virus that can cause the cell changes on the cervix. The cells collected during the Pap test can be  tested for HPV. The HPV test could be used to screen women aged 30 years and older, and should be used in women of any age who have unclear Pap test results. After the age of 30, women should have HPV testing at the same frequency as a Pap test.  Colorectal cancer can be detected and often prevented. Most routine colorectal cancer screening begins at the age of 50 years and continues through age 75 years. However, your health care provider may recommend screening at an earlier age if you have risk factors for colon cancer. On a yearly basis, your health care provider may provide home test kits to check for hidden blood in the stool. Use of a small camera at the end of a tube, to directly examine the colon (sigmoidoscopy or colonoscopy), can detect the earliest forms of colorectal cancer. Talk to your health care provider about this at age 50, when routine screening begins. Direct  exam of the colon should be repeated every 5-10 years through age 75 years, unless early forms of pre-cancerous polyps or small growths are found.  People who are at an increased risk for hepatitis B should be screened for this virus. You are considered at high risk for hepatitis B if:  You were born in a country where hepatitis B occurs often. Talk with your health care provider about which countries are considered high risk.  Your parents were born in a high-risk country and you have not received a shot to protect against hepatitis B (hepatitis B vaccine).  You have HIV or AIDS.  You use needles to inject street drugs.  You live with, or have sex with, someone who has hepatitis B.  You get hemodialysis treatment.  You take certain medicines for conditions like cancer, organ transplantation, and autoimmune conditions.  Hepatitis C blood testing is recommended for all people born from 1945 through 1965 and any individual with known risks for hepatitis C.  Practice safe sex. Use condoms and avoid high-risk sexual practices to reduce the spread of sexually transmitted infections (STIs). STIs include gonorrhea, chlamydia, syphilis, trichomonas, herpes, HPV, and human immunodeficiency virus (HIV). Herpes, HIV, and HPV are viral illnesses that have no cure. They can result in disability, cancer, and death.  You should be screened for sexually transmitted illnesses (STIs) including gonorrhea and chlamydia if:  You are sexually active and are younger than 24 years.  You are older than 24 years and your health care provider tells you that you are at risk for this type of infection.  Your sexual activity has changed since you were last screened and you are at an increased risk for chlamydia or gonorrhea. Ask your health care provider if you are at risk.  If you are at risk of being infected with HIV, it is recommended that you take a prescription medicine daily to prevent HIV infection. This is  called preexposure prophylaxis (PrEP). You are considered at risk if:  You are a heterosexual woman, are sexually active, and are at increased risk for HIV infection.  You take drugs by injection.  You are sexually active with a partner who has HIV.  Talk with your health care provider about whether you are at high risk of being infected with HIV. If you choose to begin PrEP, you should first be tested for HIV. You should then be tested every 3 months for as long as you are taking PrEP.  Osteoporosis is a disease in which the bones lose minerals and strength   with aging. This can result in serious bone fractures or breaks. The risk of osteoporosis can be identified using a bone density scan. Women ages 65 years and over and women at risk for fractures or osteoporosis should discuss screening with their health care providers. Ask your health care provider whether you should take a calcium supplement or vitamin D to reduce the rate of osteoporosis.  Menopause can be associated with physical symptoms and risks. Hormone replacement therapy is available to decrease symptoms and risks. You should talk to your health care provider about whether hormone replacement therapy is right for you.  Use sunscreen. Apply sunscreen liberally and repeatedly throughout the day. You should seek shade when your shadow is shorter than you. Protect yourself by wearing long sleeves, pants, a wide-brimmed hat, and sunglasses year round, whenever you are outdoors.  Once a month, do a whole body skin exam, using a mirror to look at the skin on your back. Tell your health care provider of new moles, moles that have irregular borders, moles that are larger than a pencil eraser, or moles that have changed in shape or color.  Stay current with required vaccines (immunizations).  Influenza vaccine. All adults should be immunized every year.  Tetanus, diphtheria, and acellular pertussis (Td, Tdap) vaccine. Pregnant women should  receive 1 dose of Tdap vaccine during each pregnancy. The dose should be obtained regardless of the length of time since the last dose. Immunization is preferred during the 27th-36th week of gestation. An adult who has not previously received Tdap or who does not know her vaccine status should receive 1 dose of Tdap. This initial dose should be followed by tetanus and diphtheria toxoids (Td) booster doses every 10 years. Adults with an unknown or incomplete history of completing a 3-dose immunization series with Td-containing vaccines should begin or complete a primary immunization series including a Tdap dose. Adults should receive a Td booster every 10 years.  Varicella vaccine. An adult without evidence of immunity to varicella should receive 2 doses or a second dose if she has previously received 1 dose. Pregnant females who do not have evidence of immunity should receive the first dose after pregnancy. This first dose should be obtained before leaving the health care facility. The second dose should be obtained 4-8 weeks after the first dose.  Human papillomavirus (HPV) vaccine. Females aged 13-26 years who have not received the vaccine previously should obtain the 3-dose series. The vaccine is not recommended for use in pregnant females. However, pregnancy testing is not needed before receiving a dose. If a female is found to be pregnant after receiving a dose, no treatment is needed. In that case, the remaining doses should be delayed until after the pregnancy. Immunization is recommended for any person with an immunocompromised condition through the age of 26 years if she did not get any or all doses earlier. During the 3-dose series, the second dose should be obtained 4-8 weeks after the first dose. The third dose should be obtained 24 weeks after the first dose and 16 weeks after the second dose.  Zoster vaccine. One dose is recommended for adults aged 60 years or older unless certain conditions are  present.  Measles, mumps, and rubella (MMR) vaccine. Adults born before 1957 generally are considered immune to measles and mumps. Adults born in 1957 or later should have 1 or more doses of MMR vaccine unless there is a contraindication to the vaccine or there is laboratory evidence of immunity to   each of the three diseases. A routine second dose of MMR vaccine should be obtained at least 28 days after the first dose for students attending postsecondary schools, health care workers, or international travelers. People who received inactivated measles vaccine or an unknown type of measles vaccine during 1963-1967 should receive 2 doses of MMR vaccine. People who received inactivated mumps vaccine or an unknown type of mumps vaccine before 1979 and are at high risk for mumps infection should consider immunization with 2 doses of MMR vaccine. For females of childbearing age, rubella immunity should be determined. If there is no evidence of immunity, females who are not pregnant should be vaccinated. If there is no evidence of immunity, females who are pregnant should delay immunization until after pregnancy. Unvaccinated health care workers born before 1957 who lack laboratory evidence of measles, mumps, or rubella immunity or laboratory confirmation of disease should consider measles and mumps immunization with 2 doses of MMR vaccine or rubella immunization with 1 dose of MMR vaccine.  Pneumococcal 13-valent conjugate (PCV13) vaccine. When indicated, a person who is uncertain of her immunization history and has no record of immunization should receive the PCV13 vaccine. An adult aged 19 years or older who has certain medical conditions and has not been previously immunized should receive 1 dose of PCV13 vaccine. This PCV13 should be followed with a dose of pneumococcal polysaccharide (PPSV23) vaccine. The PPSV23 vaccine dose should be obtained at least 8 weeks after the dose of PCV13 vaccine. An adult aged 19  years or older who has certain medical conditions and previously received 1 or more doses of PPSV23 vaccine should receive 1 dose of PCV13. The PCV13 vaccine dose should be obtained 1 or more years after the last PPSV23 vaccine dose.  Pneumococcal polysaccharide (PPSV23) vaccine. When PCV13 is also indicated, PCV13 should be obtained first. All adults aged 65 years and older should be immunized. An adult younger than age 65 years who has certain medical conditions should be immunized. Any person who resides in a nursing home or long-term care facility should be immunized. An adult smoker should be immunized. People with an immunocompromised condition and certain other conditions should receive both PCV13 and PPSV23 vaccines. People with human immunodeficiency virus (HIV) infection should be immunized as soon as possible after diagnosis. Immunization during chemotherapy or radiation therapy should be avoided. Routine use of PPSV23 vaccine is not recommended for American Indians, [place] Natives, or people younger than 65 years unless there are medical conditions that require PPSV23 vaccine. When indicated, people who have unknown immunization and have no record of immunization should receive PPSV23 vaccine. One-time revaccination 5 years after the first dose of PPSV23 is recommended for people aged 19-64 years who have chronic kidney failure, nephrotic syndrome, asplenia, or immunocompromised conditions. People who received 1-2 doses of PPSV23 before age 65 years should receive another dose of PPSV23 vaccine at age 65 years or later if at least 5 years have passed since the previous dose. Doses of PPSV23 are not needed for people immunized with PPSV23 at or after age 65 years.  Meningococcal vaccine. Adults with asplenia or persistent complement component deficiencies should receive 2 doses of quadrivalent meningococcal conjugate (MenACWY-D) vaccine. The doses should be obtained at least 2 months apart.  Microbiologists working with certain meningococcal bacteria, military recruits, people at risk during an outbreak, and people who travel to or live in countries with a high rate of meningitis should be immunized. A first-year college student up through age   21 years who is living in a residence hall should receive a dose if she did not receive a dose on or after her 16th birthday. Adults who have certain high-risk conditions should receive one or more doses of vaccine.  Hepatitis A vaccine. Adults who wish to be protected from this disease, have certain high-risk conditions, work with hepatitis A-infected animals, work in hepatitis A research labs, or travel to or work in countries with a high rate of hepatitis A should be immunized. Adults who were previously unvaccinated and who anticipate close contact with an international adoptee during the first 60 days after arrival in the [place] from a country with a high rate of hepatitis A should be immunized.  Hepatitis B vaccine. Adults who wish to be protected from this disease, have certain high-risk conditions, may be exposed to blood or other infectious body fluids, are household contacts or sex partners of hepatitis B positive people, are clients or workers in certain care facilities, or travel to or work in countries with a high rate of hepatitis B should be immunized.  Haemophilus influenzae type b (Hib) vaccine. A previously unvaccinated person with asplenia or sickle cell disease or having a scheduled splenectomy should receive 1 dose of Hib vaccine. Regardless of previous immunization, a recipient of a hematopoietic stem cell transplant should receive a 3-dose series 6-12 months after her successful transplant. Hib vaccine is not recommended for adults with HIV infection. Preventive Services / Frequency Ages 64 to 68 years  Blood pressure check.** / Every 1 to 2 years.  Lipid and cholesterol check.** / Every 5 years beginning at age  22.  Clinical breast exam.** / Every 3 years for women in their 88s and 53s.  BRCA-related cancer risk assessment.** / For women who have family members with a BRCA-related cancer (breast, ovarian, tubal, or peritoneal cancers).  Pap test.** / Every 2 years from ages 90 through 51. Every 3 years starting at age 21 through age 56 or 3 with a history of 3 consecutive normal Pap tests.  HPV screening.** / Every 3 years from ages 24 through ages 1 to 46 with a history of 3 consecutive normal Pap tests.  Hepatitis C blood test.** / For any individual with known risks for hepatitis C.  Skin self-exam. / Monthly.  Influenza vaccine. / Every year.  Tetanus, diphtheria, and acellular pertussis (Tdap, Td) vaccine.** / Consult your health care provider. Pregnant women should receive 1 dose of Tdap vaccine during each pregnancy. 1 dose of Td every 10 years.  Varicella vaccine.** / Consult your health care provider. Pregnant females who do not have evidence of immunity should receive the first dose after pregnancy.  HPV vaccine. / 3 doses over 6 months, if 72 and younger. The vaccine is not recommended for use in pregnant females. However, pregnancy testing is not needed before receiving a dose.  Measles, mumps, rubella (MMR) vaccine.** / You need at least 1 dose of MMR if you were born in 1957 or later. You may also need a 2nd dose. For females of childbearing age, rubella immunity should be determined. If there is no evidence of immunity, females who are not pregnant should be vaccinated. If there is no evidence of immunity, females who are pregnant should delay immunization until after pregnancy.  Pneumococcal 13-valent conjugate (PCV13) vaccine.** / Consult your health care provider.  Pneumococcal polysaccharide (PPSV23) vaccine.** / 1 to 2 doses if you smoke cigarettes or if you have certain conditions.  Meningococcal vaccine.** /  1 dose if you are age 19 to 21 years and a first-year college  student living in a residence hall, or have one of several medical conditions, you need to get vaccinated against meningococcal disease. You may also need additional booster doses.  Hepatitis A vaccine.** / Consult your health care provider.  Hepatitis B vaccine.** / Consult your health care provider.  Haemophilus influenzae type b (Hib) vaccine.** / Consult your health care provider. Ages 40 to 64 years  Blood pressure check.** / Every 1 to 2 years.  Lipid and cholesterol check.** / Every 5 years beginning at age 20 years.  Lung cancer screening. / Every year if you are aged 55-80 years and have a 30-pack-year history of smoking and currently smoke or have quit within the past 15 years. Yearly screening is stopped once you have quit smoking for at least 15 years or develop a health problem that would prevent you from having lung cancer treatment.  Clinical breast exam.** / Every year after age 40 years.  BRCA-related cancer risk assessment.** / For women who have family members with a BRCA-related cancer (breast, ovarian, tubal, or peritoneal cancers).  Mammogram.** / Every year beginning at age 40 years and continuing for as long as you are in good health. Consult with your health care provider.  Pap test.** / Every 3 years starting at age 30 years through age 65 or 70 years with a history of 3 consecutive normal Pap tests.  HPV screening.** / Every 3 years from ages 30 years through ages 65 to 70 years with a history of 3 consecutive normal Pap tests.  Fecal occult blood test (FOBT) of stool. / Every year beginning at age 50 years and continuing until age 75 years. You may not need to do this test if you get a colonoscopy every 10 years.  Flexible sigmoidoscopy or colonoscopy.** / Every 5 years for a flexible sigmoidoscopy or every 10 years for a colonoscopy beginning at age 50 years and continuing until age 75 years.  Hepatitis C blood test.** / For all people born from 1945 through  1965 and any individual with known risks for hepatitis C.  Skin self-exam. / Monthly.  Influenza vaccine. / Every year.  Tetanus, diphtheria, and acellular pertussis (Tdap/Td) vaccine.** / Consult your health care provider. Pregnant women should receive 1 dose of Tdap vaccine during each pregnancy. 1 dose of Td every 10 years.  Varicella vaccine.** / Consult your health care provider. Pregnant females who do not have evidence of immunity should receive the first dose after pregnancy.  Zoster vaccine.** / 1 dose for adults aged 60 years or older.  Measles, mumps, rubella (MMR) vaccine.** / You need at least 1 dose of MMR if you were born in 1957 or later. You may also need a 2nd dose. For females of childbearing age, rubella immunity should be determined. If there is no evidence of immunity, females who are not pregnant should be vaccinated. If there is no evidence of immunity, females who are pregnant should delay immunization until after pregnancy.  Pneumococcal 13-valent conjugate (PCV13) vaccine.** / Consult your health care provider.  Pneumococcal polysaccharide (PPSV23) vaccine.** / 1 to 2 doses if you smoke cigarettes or if you have certain conditions.  Meningococcal vaccine.** / Consult your health care provider.  Hepatitis A vaccine.** / Consult your health care provider.  Hepatitis B vaccine.** / Consult your health care provider.  Haemophilus influenzae type b (Hib) vaccine.** / Consult your health care provider. Ages 65   years and over  Blood pressure check.** / Every 1 to 2 years.  Lipid and cholesterol check.** / Every 5 years beginning at age 36 years.  Lung cancer screening. / Every year if you are aged 71-80 years and have a 30-pack-year history of smoking and currently smoke or have quit within the past 15 years. Yearly screening is stopped once you have quit smoking for at least 15 years or develop a health problem that would prevent you from having lung cancer  treatment.  Clinical breast exam.** / Every year after age 52 years.  BRCA-related cancer risk assessment.** / For women who have family members with a BRCA-related cancer (breast, ovarian, tubal, or peritoneal cancers).  Mammogram.** / Every year beginning at age 79 years and continuing for as long as you are in good health. Consult with your health care provider.  Pap test.** / Every 3 years starting at age 20 years through age 57 or 64 years with 3 consecutive normal Pap tests. Testing can be stopped between 65 and 70 years with 3 consecutive normal Pap tests and no abnormal Pap or HPV tests in the past 10 years.  HPV screening.** / Every 3 years from ages 84 years through ages 69 or 32 years with a history of 3 consecutive normal Pap tests. Testing can be stopped between 65 and 70 years with 3 consecutive normal Pap tests and no abnormal Pap or HPV tests in the past 10 years.  Fecal occult blood test (FOBT) of stool. / Every year beginning at age 66 years and continuing until age 44 years. You may not need to do this test if you get a colonoscopy every 10 years.  Flexible sigmoidoscopy or colonoscopy.** / Every 5 years for a flexible sigmoidoscopy or every 10 years for a colonoscopy beginning at age 47 years and continuing until age 37 years.  Hepatitis C blood test.** / For all people born from [birth year] through 1964 and any individual with known risks for hepatitis C.  Osteoporosis screening.** / A one-time screening for women ages 46 years and over and women at risk for fractures or osteoporosis.  Skin self-exam. / Monthly.  Influenza vaccine. / Every year.  Tetanus, diphtheria, and acellular pertussis (Tdap/Td) vaccine.** / 1 dose of Td every 10 years.  Varicella vaccine.** / Consult your health care provider.  Zoster vaccine.** / 1 dose for adults aged 36 years or older.  Pneumococcal 13-valent conjugate (PCV13) vaccine.** / Consult your health care provider.  Pneumococcal  polysaccharide (PPSV23) vaccine.** / 1 dose for all adults aged 58 years and older.  Meningococcal vaccine.** / Consult your health care provider.  Hepatitis A vaccine.** / Consult your health care provider.  Hepatitis B vaccine.** / Consult your health care provider.  Haemophilus influenzae type b (Hib) vaccine.** / Consult your health care provider. ** Family history and personal history of risk and conditions may change your health care provider's recommendations. Document Released: 08/24/2001 Document Revised: 11/12/2013 Document Reviewed: 11/23/2010 [facility] Patient Information 2015 [place], [place]. This information is not intended to replace advice given to you by your health care provider. Make sure you discuss any questions you have with your health care provider. Constipation Constipation is when a person has fewer than three bowel movements a week, has difficulty having a bowel movement, or has stools that are dry, hard, or larger than normal. As people grow older, constipation is more common. If you try to fix constipation with medicines that make you have a bowel movement (  laxatives), the problem may get worse. Long-term laxative use may cause the muscles of the colon to become weak. A low-fiber diet, not taking in enough fluids, and taking certain medicines may make constipation worse.  CAUSES   Certain medicines, such as antidepressants, pain medicine, iron supplements, antacids, and water pills.   Certain diseases, such as diabetes, irritable bowel syndrome (IBS), thyroid disease, or depression.   Not drinking enough water.   Not eating enough fiber-rich foods.   Stress or travel.   Lack of physical activity or exercise.   Ignoring the urge to have a bowel movement.   Using laxatives too much.  SIGNS AND SYMPTOMS   Having fewer than three bowel movements a week.   Straining to have a bowel movement.   Having stools that are hard, dry, or larger than  normal.   Feeling full or bloated.   Pain in the lower abdomen.   Not feeling relief after having a bowel movement.  DIAGNOSIS  Your health care provider will take a medical history and perform a physical exam. Further testing may be done for severe constipation. Some tests may include:  A barium enema X-ray to examine your rectum, colon, and, sometimes, your small intestine.   A sigmoidoscopy to examine your lower colon.   A colonoscopy to examine your entire colon. TREATMENT  Treatment will depend on the severity of your constipation and what is causing it. Some dietary treatments include drinking more fluids and eating more fiber-rich foods. Lifestyle treatments may include regular exercise. If these diet and lifestyle recommendations do not help, your health care provider may recommend taking over-the-counter laxative medicines to help you have bowel movements. Prescription medicines may be prescribed if over-the-counter medicines do not work.  HOME CARE INSTRUCTIONS   Eat foods that have a lot of fiber, such as fruits, vegetables, whole grains, and beans.  Limit foods high in fat and processed sugars, such as french fries, hamburgers, cookies, candies, and soda.   A fiber supplement may be added to your diet if you cannot get enough fiber from foods.   Drink enough fluids to keep your urine clear or pale yellow.   Exercise regularly or as directed by your health care provider.   Go to the restroom when you have the urge to go. Do not hold it.   Only take over-the-counter or prescription medicines as directed by your health care provider. Do not take other medicines for constipation without talking to your health care provider first.  [place] IF:   You have bright red blood in your stool.   Your constipation lasts for more than 4 days or gets worse.   You have abdominal or rectal pain.   You have thin, pencil-like stools.   You have  unexplained weight loss. MAKE SURE YOU:   Understand these instructions.  Will watch your condition.  Will get help right away if you are not doing well or get worse. Document Released: 03/26/2004 Document Revised: 07/03/2013 Document Reviewed: 04/09/2013 [facility] Patient Information 2015 [place], [place]. This information is not intended to replace advice given to you by your health care provider. Make sure you discuss any questions you have with your health care provider.

## 2014-07-31 NOTE — Progress Notes (Signed)
Subjective:    Patient ID: Jacqueline Ferrell, female    DOB: 1965/10/20, 49 y.o.   MRN: [id number]  Constipation This is a recurrent problem. The current episode started more than 1 month ago. The problem is unchanged. Her stool frequency is 2 to 3 times per week. The stool is described as formed and firm. The patient is on a high fiber diet. She exercises regularly. There has been adequate water intake. Pertinent negatives include no abdominal pain, anorexia, back pain, bloating, diarrhea, difficulty urinating, fecal incontinence, fever, flatus, hematochezia, hemorrhoids, melena, nausea, rectal pain, vomiting or weight loss. Risk factors include change in medication usage/dosage. She has tried laxatives and diet changes (linzess - did not like it) for the symptoms. The treatment provided mild relief.      Review of Systems  Constitutional: Negative.  Negative for fever, chills, weight loss, diaphoresis, appetite change and fatigue.  HENT: Negative.   Eyes: Negative.   Respiratory: Negative.  Negative for cough, choking, chest tightness, shortness of breath and stridor.   Cardiovascular: Negative.  Negative for chest pain, palpitations and leg swelling.  Gastrointestinal: Positive for constipation. Negative for nausea, vomiting, abdominal pain, diarrhea, blood in stool, melena, hematochezia, anal bleeding, rectal pain, bloating, anorexia, flatus and hemorrhoids.  Endocrine: Negative.   Genitourinary: Negative.  Negative for difficulty urinating.  Musculoskeletal: Negative.  Negative for back pain and arthralgias.  Skin: Negative.   Allergic/Immunologic: Negative.   Neurological: Negative.   Hematological: Negative.  Negative for adenopathy. Does not bruise/bleed easily.  Psychiatric/Behavioral: Negative.        Objective:   Physical Exam  Constitutional: She is oriented to person, place, and time. She appears well-developed and well-nourished. No distress.  HENT:  Head: Normocephalic  and atraumatic.  Mouth/Throat: Oropharynx is clear and moist. No oropharyngeal exudate.  Eyes: Conjunctivae are normal. Right eye exhibits no discharge. Left eye exhibits no discharge. No scleral icterus.  Neck: Normal range of motion. Neck supple. No JVD present. No tracheal deviation present. No thyromegaly present.  Cardiovascular: Normal rate, regular rhythm, normal heart sounds and intact distal pulses.  Exam reveals no gallop and no friction rub.   No murmur heard. Pulmonary/Chest: Effort normal and breath sounds normal. No stridor. No respiratory distress. She has no wheezes. She has no rales. She exhibits no tenderness.  Abdominal: Soft. Bowel sounds are normal. She exhibits no distension and no mass. There is no tenderness. There is no rebound and no guarding.  Musculoskeletal: Normal range of motion. She exhibits no edema or tenderness.  Lymphadenopathy:    She has no cervical adenopathy.  Neurological: She is oriented to person, place, and time.  Skin: Skin is warm and dry. No rash noted. She is not diaphoretic. No erythema. No pallor.  Psychiatric: She has a normal mood and affect. Her behavior is normal. Judgment and thought content normal.  Vitals reviewed.    Lab Results  Component Value Date   WBC 7.6 04/09/2014   HGB 12.8 04/09/2014   HCT 37.3 04/09/2014   PLT 225 04/09/2014   GLUCOSE 91 04/09/2014   CHOL 150 02/19/2014   TRIG 43.0 02/19/2014   HDL 30.20* 02/19/2014   LDLCALC 111* 02/19/2014   ALT 24 04/09/2014   AST 35 04/09/2014   NA 138 04/09/2014   K 5.0 04/09/2014   CL 101 04/09/2014   CREATININE 0.80 04/09/2014   BUN 18 04/09/2014   CO2 22 04/09/2014   TSH 3.03 02/19/2014   INR 1.1* 09/11/2010  HGBA1C 6.2 02/19/2014       Assessment & Plan:

## 2014-07-31 NOTE — Progress Notes (Signed)
Pre visit review using our clinic review tool, if applicable. No additional management support is needed unless otherwise documented below in the visit note. 

## 2014-07-31 NOTE — Assessment & Plan Note (Signed)
She has stopped taking norco so this should improve Will check her labs today to screen for secondary organic causes She will try miralax

## 2014-07-31 NOTE — Assessment & Plan Note (Signed)
Vaccines were reviewed and updated She was referred for a mammogram PAP is UTD Exam done Labs ordered Pt ed material was given

## 2014-08-14 ENCOUNTER — Other Ambulatory Visit: Payer: Self-pay | Admitting: Internal Medicine

## 2014-08-22 ENCOUNTER — Ambulatory Visit (HOSPITAL_BASED_OUTPATIENT_CLINIC_OR_DEPARTMENT_OTHER): Payer: Managed Care, Other (non HMO) | Admitting: Physical Medicine & Rehabilitation

## 2014-08-22 ENCOUNTER — Encounter: Payer: Managed Care, Other (non HMO) | Attending: Physical Medicine & Rehabilitation

## 2014-08-22 ENCOUNTER — Encounter: Payer: Self-pay | Admitting: Physical Medicine & Rehabilitation

## 2014-08-22 VITALS — BP 145/104 | HR 68 | Resp 14

## 2014-08-22 DIAGNOSIS — G894 Chronic pain syndrome: Secondary | ICD-10-CM | POA: Diagnosis present

## 2014-08-22 DIAGNOSIS — M48062 Spinal stenosis, lumbar region with neurogenic claudication: Secondary | ICD-10-CM | POA: Insufficient documentation

## 2014-08-22 DIAGNOSIS — M47816 Spondylosis without myelopathy or radiculopathy, lumbar region: Secondary | ICD-10-CM

## 2014-08-22 DIAGNOSIS — Z79899 Other long term (current) drug therapy: Secondary | ICD-10-CM | POA: Insufficient documentation

## 2014-08-22 NOTE — Patient Instructions (Signed)

## 2014-08-22 NOTE — Progress Notes (Signed)
  PROCEDURE RECORD  Physical Medicine and Rehabilitation   Name: Jacqueline Ferrell DOB:September 09, 1965 MRN: [id number]  Date:08/22/2014  Physician: Alysia Penna, MD    Nurse/CMA: Mancel Parsons, cma  Allergies:  Allergies  Allergen Reactions  . Lisinopril Other (See Comments)    REACTION: Tongue swelling    Consent Signed: Yes.    Is patient diabetic? No.  CBG today?   Pregnant: No. LMP: Patient's last menstrual period was 02/10/2008. (age 48-55)  Anticoagulants: no Anti-inflammatory: no Antibiotics: no  Procedure: bilateral medial branch block  Position: Prone Start Time:10:04 am  End Time:10:16 am  Fluoro Time: 47  RN/CMA Rolan Bucco Kristjan Derner    Time 9:50 am 10:22 am    BP 144/104 173/89    Pulse 68 69    Respirations 14 14    O2 Sat 100 99    S/S 6 6    Pain Level 2/10 0/10     D/C home with [name], patient A & O X 3, D/C instructions reviewed, and sits independently.

## 2014-08-22 NOTE — Progress Notes (Signed)
Bilateral Lumbar L3, L4  medial branch blocks and L 5 dorsal ramus injection under fluoroscopic guidance  Indication: Lumbar pain which is not relieved by medication management or other conservative care and interfering with self-care and mobility.  Informed consent was obtained after describing risks and benefits of the procedure with the patient, this includes bleeding, infection, paralysis and medication side effects.  The patient wishes to proceed and has given written consent.  The patient was placed in prone position.  The lumbar area was marked and prepped with Betadine.  One mL of 1% lidocaine was injected into each of 6 areas into the skin and subcutaneous tissue.  Then a 22-gauge 5in spinal needle was inserted targeting the junction of the left S1 superior articular process and sacral ala junction. Needle was advanced under fluoroscopic guidance.  Bone contact was made.  Omnipaque 180 was injected x 0.5 mL demonstrating no intravascular uptake.  Then a solution containing one mL of 4 mg per mL dexamethasone and 3 mL of 2% MPF lidocaine was injected x 0.5 mL.  Then the left L5 superior articular process in transverse process junction was targeted.  Bone contact was made.  Omnipaque 180 was injected x 0.5 mL demonstrating no intravascular uptake. Then a solution containing one mL of 4 mg per mL dexamethasone and 3 mL of 2% MPF lidocaine was injected x 0.5 mL.  Then the left L4 superior articular process in transverse process junction was targeted.  Bone contact was made.  Omnipaque 180 was injected x 0.5 mL demonstrating no intravascular uptake.  Then a solution containing one mL of 4 mg per mL dexamethasone and 3 mL if 2% MPF lidocaine was injected x 0.5 mL.  This same procedure was performed on the right side using the same needle, technique and injectate.  Patient tolerated procedure well.  Post procedure instructions were given. 

## 2014-10-03 ENCOUNTER — Ambulatory Visit: Payer: Managed Care, Other (non HMO) | Admitting: Physical Medicine & Rehabilitation

## 2014-11-05 ENCOUNTER — Encounter: Payer: Managed Care, Other (non HMO) | Attending: Physical Medicine & Rehabilitation

## 2014-11-05 ENCOUNTER — Ambulatory Visit: Payer: Managed Care, Other (non HMO) | Admitting: Physical Medicine & Rehabilitation

## 2014-11-05 DIAGNOSIS — M47816 Spondylosis without myelopathy or radiculopathy, lumbar region: Secondary | ICD-10-CM | POA: Insufficient documentation

## 2014-11-05 DIAGNOSIS — G894 Chronic pain syndrome: Secondary | ICD-10-CM | POA: Insufficient documentation

## 2014-11-05 DIAGNOSIS — Z79899 Other long term (current) drug therapy: Secondary | ICD-10-CM | POA: Insufficient documentation

## 2015-03-13 ENCOUNTER — Encounter (HOSPITAL_COMMUNITY): Payer: Self-pay | Admitting: Emergency Medicine

## 2015-03-13 ENCOUNTER — Emergency Department (HOSPITAL_COMMUNITY)
Admission: EM | Admit: 2015-03-13 | Discharge: 2015-03-13 | Disposition: A | Discharge status: Home / Self Care | Payer: Managed Care, Other (non HMO) | Attending: Emergency Medicine | Admitting: Emergency Medicine

## 2015-03-13 ENCOUNTER — Emergency Department (HOSPITAL_COMMUNITY): Payer: Managed Care, Other (non HMO)

## 2015-03-13 DIAGNOSIS — Z9119 Patient's noncompliance with other medical treatment and regimen: Secondary | ICD-10-CM | POA: Insufficient documentation

## 2015-03-13 DIAGNOSIS — K5900 Constipation, unspecified: Secondary | ICD-10-CM | POA: Insufficient documentation

## 2015-03-13 DIAGNOSIS — Z79899 Other long term (current) drug therapy: Secondary | ICD-10-CM | POA: Diagnosis not present

## 2015-03-13 DIAGNOSIS — K644 Residual hemorrhoidal skin tags: Secondary | ICD-10-CM | POA: Insufficient documentation

## 2015-03-13 DIAGNOSIS — E669 Obesity, unspecified: Secondary | ICD-10-CM | POA: Insufficient documentation

## 2015-03-13 DIAGNOSIS — Z87442 Personal history of urinary calculi: Secondary | ICD-10-CM | POA: Diagnosis not present

## 2015-03-13 DIAGNOSIS — Z7982 Long term (current) use of aspirin: Secondary | ICD-10-CM | POA: Diagnosis not present

## 2015-03-13 DIAGNOSIS — E785 Hyperlipidemia, unspecified: Secondary | ICD-10-CM | POA: Insufficient documentation

## 2015-03-13 DIAGNOSIS — I10 Essential (primary) hypertension: Secondary | ICD-10-CM | POA: Insufficient documentation

## 2015-03-13 DIAGNOSIS — I2510 Atherosclerotic heart disease of native coronary artery without angina pectoris: Secondary | ICD-10-CM | POA: Insufficient documentation

## 2015-03-13 MED ORDER — HYDROCORTISONE 2.5 % RE CREA: TOPICAL_CREAM | RECTAL | Status: DC

## 2015-03-13 MED ORDER — DOCUSATE SODIUM 100 MG PO CAPS: 100.0000 mg | ORAL_CAPSULE | Freq: Two times a day (BID) | ORAL | Status: DC

## 2015-03-13 MED ORDER — POLYETHYLENE GLYCOL 3350 17 GM/SCOOP PO POWD: 17.0000 g | Freq: Every day | ORAL | Status: DC

## 2015-03-13 MED ORDER — ONDANSETRON 4 MG PO TBDP
4.0000 mg | ORAL_TABLET | Freq: Once | ORAL | Status: AC
  Administered 2015-03-13: 4 mg via ORAL
  Filled 2015-03-13: qty 1

## 2015-03-13 NOTE — ED Provider Notes (Signed)
CSN: [id number]     Arrival date & time 03/13/15  1452 History   First MD Initiated Contact with Patient 03/13/15 1504     Chief Complaint  Patient presents with  . Hemorrhoids  . Nausea  . Constipation     (Consider location/radiation/quality/duration/timing/severity/associated sxs/prior Treatment) HPI Comments: Patient presents today with a complaints of constipation, nausea, and hemorrhoids.  She states that approximately 4-5 days ago she felt as if she was becoming constipated.  She states that today she used a fleet enema and passed a hard large ball of stool earlier today.  She states that since that time she has had numerous episodes of loose watery stool.  She has noticed a small amount of bright red blood mixed in with her stool.  She denies melena.  She reports associated nausea, but denies vomiting.  She states that she had diffuse abdominal pain earlier today before her BM, but pain resolved after the BM.  She denies fever, chills, or urinary symptoms.  She states that she noticed a very large hemorrhoid earlier today, which is tender.  She states that the hemorrhoid made her anxious and was causing heart palpitations, which is common for her with anxiety.  Heart palpitations occur while trying to have BM.  No heart palpitations at this time.  She denies any chest pain, SOB, dizziness, or syncope.  The history is provided by the patient.    Past Medical History  Diagnosis Date  . Hypertension   . Nephrolithiasis   . Hyperlipidemia   . Obese   . Chest pain, exertional     lexiscan myoview in 1/12 with EF 52% and a small reversible defect in diagonal territory either due to ischemia or shifting breast attenuation. ECHO 50-55%, mild LAE, mild LVH, no significant valvular abnormalities  . Coronary artery disease   . Poor compliance with medication    Past Surgical History  Procedure Laterality Date  . Abdominal hysterectomy  2009  . Coronary angioplasty with stent placement      Family History  Problem Relation Age of Onset  . Heart attack Father 74    MI  . Heart disease Father   . Hypertension Other   . Hyperlipidemia Other   . Heart attack Cousin     5 first cousins with MI's in their 73s  . Coronary artery disease Neg Hx   . Stroke Neg Hx   . Alcohol abuse Neg Hx   . Cancer Neg Hx   . Diabetes Neg Hx   . Early death Neg Hx   . Hypertension Mother   . Hyperlipidemia Sister   . Hypertension Brother   . Hypertension Brother    Social History  Substance Use Topics  . Smoking status: Never Smoker   . Smokeless tobacco: Never Used  . Alcohol Use: No     Comment: occasional   OB History    No data available     Review of Systems  All other systems reviewed and are negative.     Allergies  Lisinopril  Home Medications   Prior to Admission medications   Medication Sig Start Date End Date Taking? Authorizing Provider  aspirin 81 MG EC tablet Take 162 mg by mouth daily.    Yes Historical Provider, MD  atorvastatin (LIPITOR) 80 MG tablet TAKE 1 TABLET (80 MG TOTAL) BY MOUTH DAILY. 07/14/14  Yes Janith Lima, MD  losartan-hydrochlorothiazide (HYZAAR) 100-25 MG per tablet TAKE 1 TABLET BY MOUTH DAILY. 07/14/14  Yes Janith Lima, MD  metoprolol succinate (TOPROL-XL) 25 MG 24 hr tablet TAKE 1 TABLET BY MOUTH EVERY DAY Patient taking differently: TAKE 25 MG BY MOUTH DAILY 07/14/14  Yes Janith Lima, MD  Omega-3 Fatty Acids (FISH OIL PO) Take 2 capsules by mouth daily.   Yes Historical Provider, MD  ZETIA 10 MG tablet TAKE 1 TABLET BY MOUTH EVERY DAY Patient taking differently: TAKE 10 MG BY MOUTH DAILY 07/14/14  Yes Janith Lima, MD  ALPRAZolam Duanne Moron) 0.25 MG tablet Take 1 tablet (0.25 mg total) by mouth 2 (two) times daily as needed for anxiety. Patient not taking: Reported on 03/13/2015 04/09/14   Pamella Pert, MD  diazepam (VALIUM) 5 MG tablet Take 1 tablet (5 mg total) by mouth once. Patient not taking: Reported on 08/22/2014 07/25/14    Charlett Blake, MD  metoprolol succinate (TOPROL-XL) 25 MG 24 hr tablet TAKE 1 TABLET BY MOUTH EVERY DAY Patient not taking: Reported on 03/13/2015 08/14/14   Janith Lima, MD  polyethylene glycol powder (GLYCOLAX/MIRALAX) powder Take 17 g by mouth 2 (two) times daily as needed. Patient not taking: Reported on 03/13/2015 07/31/14   Janith Lima, MD   BP 191/100 mmHg  Pulse 104  Temp(Src) 98.4 F (36.9 C) (Oral)  Resp 18  SpO2 97%  LMP 02/10/2008 Physical Exam  Constitutional: She appears well-developed and well-nourished.  HENT:  Head: Normocephalic and atraumatic.  Mouth/Throat: Oropharynx is clear and moist.  Neck: Normal range of motion. Neck supple.  Cardiovascular: Normal rate, regular rhythm and normal heart sounds.   Pulmonary/Chest: Effort normal and breath sounds normal.  Abdominal: Soft. Bowel sounds are normal. She exhibits no distension and no mass. There is no tenderness. There is no rebound and no guarding.  Genitourinary:  Two large soft non thrombosed external hemorrhoids.  No erythema, edema, or warmth of the perirectal region.  No gross bleeding.  Stool brown in color  Musculoskeletal: Normal range of motion.  Neurological: She is alert.  Skin: Skin is warm and dry.  Psychiatric: She has a normal mood and affect.  Nursing note and vitals reviewed.   ED Course  Procedures (including critical care time) Labs Review Labs Reviewed - No data to display  Imaging Review Dg Abd Acute W/chest  03/13/2015   CLINICAL DATA:  hemorrhoids, constipation, and heart palpitations. Pt does not recall the last time that she had a normal BM. Pt states she has been having small hard BMs. Pt states that she has heart palpitations when she is straining to have a BM. Pt has noted hemorrhoid coming from her rectum. Nausea with no vomiting.  EXAM: DG ABDOMEN ACUTE W/ 1V CHEST  COMPARISON:  04/09/2014 and earlier studies  FINDINGS: Relatively low lung volumes.  Heart size upper limits  normal.  No effusion.  No free air. Small bowel is nondilated. Moderate fecal material in the proximal colon, decompressed distally.  There are no abnormal calcifications.  Spurring near the thoracolumbar junction.  IMPRESSION: 1. Nonobstructive bowel gas pattern with moderate proximal colonic fecal material. 2. No acute cardiopulmonary disease.   Electronically Signed   By: Lucrezia Europe M.D.   On: 03/13/2015 17:05   I have personally reviewed and evaluated these images and lab results as part of my medical decision-making.   EKG Interpretation None      MDM   Final diagnoses:  Constipation   Patient presents today with constipation, hemorrhoids, and nausea.  Abdomen is soft and non  tender on exam.  Acute abdominal series showing non obstructive bowel gas pattern with moderate proximal colonic fecal material.  Patient with large external hemorrhoids on exam.  Hemorrhoids are non thrombosed.  No evidence of infection.  Patient stable for discharge.  Return precautions given.      Hyman Bible, PA-C 03/13/15 1813  Milton Ferguson, MD 03/13/15 Bosie Helper

## 2015-03-13 NOTE — ED Notes (Signed)
Pt c/o nausea but no vomiting as well.

## 2015-03-13 NOTE — ED Notes (Signed)
Pt comes in today with a c/o hemorrhoids, constipation, and heart palpitations. Pt does not recall the last time that she had a normal BM. Pt states she has been having small hard BMs. Pt states that she has heart palpitations when she is straining to have a BM. Pt has noted hemorrhoid coming from her rectum.

## 2015-03-13 NOTE — ED Notes (Signed)
Patient c/o constipation, hemorrhoid "the size of a fist" protruding from rectum and "dripping blood," and sensation of heart palpitations. Pt states she gets heart palpitations when anxious, and she is anxious about her hemorrhoid.

## 2015-06-26 ENCOUNTER — Encounter: Payer: Self-pay | Admitting: Internal Medicine

## 2015-06-26 ENCOUNTER — Other Ambulatory Visit (INDEPENDENT_AMBULATORY_CARE_PROVIDER_SITE_OTHER): Payer: Managed Care, Other (non HMO)

## 2015-06-26 ENCOUNTER — Ambulatory Visit (INDEPENDENT_AMBULATORY_CARE_PROVIDER_SITE_OTHER): Payer: Managed Care, Other (non HMO) | Admitting: Internal Medicine

## 2015-06-26 VITALS — BP 140/96 | HR 83 | Temp 97.9°F | Resp 16 | Ht 67.0 in | Wt 342.0 lb

## 2015-06-26 DIAGNOSIS — I10 Essential (primary) hypertension: Secondary | ICD-10-CM

## 2015-06-26 DIAGNOSIS — Z23 Encounter for immunization: Secondary | ICD-10-CM

## 2015-06-26 DIAGNOSIS — F418 Other specified anxiety disorders: Secondary | ICD-10-CM

## 2015-06-26 DIAGNOSIS — I2510 Atherosclerotic heart disease of native coronary artery without angina pectoris: Secondary | ICD-10-CM | POA: Diagnosis not present

## 2015-06-26 DIAGNOSIS — R809 Proteinuria, unspecified: Secondary | ICD-10-CM | POA: Diagnosis not present

## 2015-06-26 DIAGNOSIS — M4716 Other spondylosis with myelopathy, lumbar region: Secondary | ICD-10-CM

## 2015-06-26 DIAGNOSIS — E785 Hyperlipidemia, unspecified: Secondary | ICD-10-CM

## 2015-06-26 LAB — CBC WITH DIFFERENTIAL/PLATELET
BASOS PCT: 0.5 % (ref 0.0–3.0)
Basophils Absolute: 0 10*3/uL (ref 0.0–0.1)
EOS ABS: 0 10*3/uL (ref 0.0–0.7)
Eosinophils Relative: 0.6 % (ref 0.0–5.0)
HCT: 43.1 % (ref 36.0–46.0)
HEMOGLOBIN: 14.5 g/dL (ref 12.0–15.0)
Lymphocytes Relative: 33.9 % (ref 12.0–46.0)
Lymphs Abs: 2.9 10*3/uL (ref 0.7–4.0)
MCHC: 33.6 g/dL (ref 30.0–36.0)
MCV: 89.1 fl (ref 78.0–100.0)
MONO ABS: 0.8 10*3/uL (ref 0.1–1.0)
Monocytes Relative: 8.9 % (ref 3.0–12.0)
Neutro Abs: 4.8 10*3/uL (ref 1.4–7.7)
Neutrophils Relative %: 56.1 % (ref 43.0–77.0)
Platelets: 256 10*3/uL (ref 150.0–400.0)
RBC: 4.84 Mil/uL (ref 3.87–5.11)
RDW: 14 % (ref 11.5–15.5)
WBC: 8.5 10*3/uL (ref 4.0–10.5)

## 2015-06-26 LAB — BASIC METABOLIC PANEL
BUN: 10 mg/dL (ref 6–23)
CO2: 29 mEq/L (ref 19–32)
Calcium: 9.4 mg/dL (ref 8.4–10.5)
Chloride: 102 mEq/L (ref 96–112)
Creatinine, Ser: 0.77 mg/dL (ref 0.40–1.20)
GFR: 102.75 mL/min (ref >60.00)
GLUCOSE: 94 mg/dL (ref 70–99)
POTASSIUM: 3.5 meq/L (ref 3.5–5.1)
SODIUM: 138 meq/L (ref 135–145)

## 2015-06-26 LAB — URINALYSIS, ROUTINE W REFLEX MICROSCOPIC
BILIRUBIN URINE: NEGATIVE
Hgb urine dipstick: NEGATIVE
KETONES UR: NEGATIVE
LEUKOCYTES UA: NEGATIVE
Nitrite: NEGATIVE
PH: 6 (ref 5.0–8.0)
Specific Gravity, Urine: 1.025 (ref 1.000–1.030)
TOTAL PROTEIN, URINE-UPE24: NEGATIVE
URINE GLUCOSE: NEGATIVE
UROBILINOGEN UA: 0.2 (ref 0.0–1.0)

## 2015-06-26 LAB — TSH: TSH: 2.67 u[IU]/mL (ref 0.35–4.50)

## 2015-06-26 LAB — LIPID PANEL
CHOLESTEROL: 210 mg/dL — AB (ref 0–200)
HDL: 36.9 mg/dL — ABNORMAL LOW (ref >39.00)
LDL CALC: 155 mg/dL — AB (ref 0–99)
NONHDL: 173.03
Total CHOL/HDL Ratio: 6
Triglycerides: 91 mg/dL (ref 0.0–149.0)
VLDL: 18.2 mg/dL (ref 0.0–40.0)

## 2015-06-26 MED ORDER — BUPRENORPHINE 15 MCG/HR TD PTWK: 1.0000 | MEDICATED_PATCH | TRANSDERMAL | Status: DC

## 2015-06-26 MED ORDER — ALPRAZOLAM 0.25 MG PO TABS: 0.2500 mg | ORAL_TABLET | Freq: Two times a day (BID) | ORAL | Status: DC | PRN

## 2015-06-26 NOTE — Progress Notes (Signed)
Pre visit review using our clinic review tool, if applicable. No additional management support is needed unless otherwise documented below in the visit note. 

## 2015-06-26 NOTE — Patient Instructions (Signed)

## 2015-06-30 MED ORDER — ATORVASTATIN CALCIUM 80 MG PO TABS: ORAL_TABLET | ORAL | Status: DC

## 2015-06-30 MED ORDER — EZETIMIBE 10 MG PO TABS: 10.0000 mg | ORAL_TABLET | Freq: Every day | ORAL | Status: DC

## 2015-06-30 NOTE — Progress Notes (Signed)
Subjective:  Patient ID: Jacqueline Ferrell, female    DOB: April 29, 1966  Age: 49 y.o. MRN: [id number]  CC: Back Pain   HPI Jacqueline Ferrell presents for , worsening low back pain radiates into both thighs. She has a prior history of back surgery for degenerative disc disease and bulging discs. She describes the pain as a constant aching that becomes stabbing with standing or movement. 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 She also reports numbness diffusely in both lower extremities. She has tried Vicodin for the pain which did provide some relief but she says it made her feel severely constipated. She has seen pain management and she has received epidural steroid injections and they provided some relief but she said she cannot afford them anymore. She also complains of anxiety and panic attacks. She requests a refill on Xanax.  Outpatient Prescriptions Prior to Visit  Medication Sig Dispense Refill  . aspirin 81 MG EC tablet Take 162 mg by mouth daily.     Marland Kitchen atorvastatin (LIPITOR) 80 MG tablet TAKE 1 TABLET (80 MG TOTAL) BY MOUTH DAILY. 90 tablet 1  . docusate sodium (COLACE) 100 MG capsule Take 1 capsule (100 mg total) by mouth every 12 (twelve) hours. 60 capsule 0  . hydrocortisone (ANUSOL-HC) 2.5 % rectal cream Apply rectally 2 times daily 28.35 g 0  . losartan-hydrochlorothiazide (HYZAAR) 100-25 MG per tablet TAKE 1 TABLET BY MOUTH DAILY. 90 tablet 1  . metoprolol succinate (TOPROL-XL) 25 MG 24 hr tablet TAKE 1 TABLET BY MOUTH EVERY DAY (Patient taking differently: TAKE 25 MG BY MOUTH DAILY) 90 tablet 1  . Omega-3 Fatty Acids (FISH OIL PO) Take 2 capsules by mouth daily.    . polyethylene glycol powder (GLYCOLAX/MIRALAX) powder Take 17 g by mouth daily. 255 g 0  . ZETIA 10 MG tablet TAKE 1 TABLET BY MOUTH EVERY DAY (Patient taking differently: TAKE 10 MG BY MOUTH DAILY) 90 tablet 2  . diazepam (VALIUM) 5 MG tablet Take  1 tablet (5 mg total) by mouth once. 2 tablet 2  . metoprolol succinate (TOPROL-XL) 25 MG 24 hr tablet TAKE 1 TABLET BY MOUTH EVERY DAY 90 tablet 1  . ALPRAZolam (XANAX) 0.25 MG tablet Take 1 tablet (0.25 mg total) by mouth 2 (two) times daily as needed for anxiety. (Patient not taking: Reported on 06/26/2015) 15 tablet 0   No facility-administered medications prior to visit.    ROS Review of Systems  Constitutional: Negative.  Negative for fever, chills, diaphoresis, appetite change and fatigue.  HENT: Negative.   Eyes: Negative.   Respiratory: Negative.  Negative for cough, choking, chest tightness, shortness of breath and stridor.   Cardiovascular: Negative.  Negative for chest pain, palpitations and leg swelling.  Gastrointestinal: Positive for constipation. Negative for nausea, vomiting, abdominal pain and diarrhea.  Endocrine: Negative.   Genitourinary: Negative.  Negative for difficulty urinating.  Musculoskeletal: Positive for back pain. Negative for myalgias, joint swelling, arthralgias and neck pain.  Skin: Negative.  Negative for pallor and rash.  Allergic/Immunologic: Negative.   Neurological: Positive for numbness. Negative for dizziness, tremors, weakness and light-headedness.  Hematological: Negative.  Negative for adenopathy. Does not bruise/bleed easily.  Psychiatric/Behavioral: Positive for sleep disturbance. Negative for suicidal ideas, hallucinations, behavioral problems, confusion, self-injury, dysphoric mood, decreased concentration and agitation. The patient is nervous/anxious. The patient is not hyperactive.     Objective:  BP 140/96 mmHg  Pulse 83  Temp(Src) 97.9 F (36.6 C) (Oral)  Resp 16  Ht 5\' 7"  (1.702 m)  Wt 342 lb (155.13 kg)  BMI 53.55 kg/m2  SpO2 98%  LMP 02/10/2008  BP Readings from Last 3 Encounters:  06/26/15 140/96  03/13/15 162/76  08/22/14 145/104    Wt Readings from Last 3 Encounters:  06/26/15 342 lb (155.13 kg)  07/31/14 334 lb  (151.501 kg)  06/21/14 335 lb (151.955 kg)    Physical Exam  Constitutional: No distress.  HENT:  Head: Normocephalic and atraumatic.  Mouth/Throat: Oropharynx is clear and moist. No oropharyngeal exudate.  Eyes: Conjunctivae are normal. Right eye exhibits no discharge. Left eye exhibits no discharge. No scleral icterus.  Neck: Normal range of motion. Neck supple. No JVD present. No tracheal deviation present. No thyromegaly present.  Cardiovascular: Normal rate, regular rhythm, normal heart sounds and intact distal pulses.  Exam reveals no gallop and no friction rub.   No murmur heard. Pulmonary/Chest: Effort normal and breath sounds normal. No stridor. No respiratory distress. She has no wheezes. She has no rales. She exhibits no tenderness.  Abdominal: Soft. Bowel sounds are normal. She exhibits no distension and no mass. There is no tenderness. There is no rebound and no guarding.  Musculoskeletal: Normal range of motion. She exhibits no edema or tenderness.       Lumbar back: Normal. She exhibits normal range of motion, no tenderness, no bony tenderness and no swelling.  Lymphadenopathy:    She has no cervical adenopathy.  Neurological: She is alert. She has normal strength. She displays no atrophy, no tremor and normal reflexes. No cranial nerve deficit or sensory deficit. She exhibits normal muscle tone. She displays a negative Romberg sign. She displays no seizure activity. Coordination and gait normal.  Reflex Scores:      Tricep reflexes are 0 on the right side and 0 on the left side.      Bicep reflexes are 0 on the right side and 0 on the left side.      Brachioradialis reflexes are 0 on the right side and 0 on the left side.      Patellar reflexes are 0 on the right side and 0 on the left side.      Achilles reflexes are 0 on the right side and 0 on the left side. Neg SLR in BLE  Skin: Skin is warm and dry. No rash noted. She is not diaphoretic. No erythema. No pallor.    Psychiatric: Her speech is normal and behavior is normal. Judgment and thought content normal. Her mood appears anxious. Her affect is not angry. She does not exhibit a depressed mood. She expresses no suicidal plans and no homicidal plans.  Vitals reviewed.   Lab Results  Component Value Date   WBC 8.5 06/26/2015   HGB 14.5 06/26/2015   HCT 43.1 06/26/2015   PLT 256.0 06/26/2015   GLUCOSE 94 06/26/2015   CHOL 210* 06/26/2015   TRIG 91.0 06/26/2015   HDL 36.90* 06/26/2015   LDLCALC 155* 06/26/2015   ALT 24 04/09/2014   AST 35 04/09/2014   NA 138 06/26/2015   K 3.5 06/26/2015   CL 102 06/26/2015   CREATININE 0.77 06/26/2015   BUN 10 06/26/2015   CO2 29 06/26/2015   TSH 2.67 06/26/2015   INR 1.1* 09/11/2010   HGBA1C 6.2 02/19/2014    Dg Abd Acute W/chest  03/13/2015  CLINICAL DATA:  hemorrhoids, constipation, and heart palpitations. Pt does not  recall the last time that she had a normal BM. Pt states she has been having small hard BMs. Pt states that she has heart palpitations when she is straining to have a BM. Pt has noted hemorrhoid coming from her rectum. Nausea with no vomiting. EXAM: DG ABDOMEN ACUTE W/ 1V CHEST COMPARISON:  04/09/2014 and earlier studies FINDINGS: Relatively low lung volumes.  Heart size upper limits normal. No effusion. No free air. Small bowel is nondilated. Moderate fecal material in the proximal colon, decompressed distally. There are no abnormal calcifications. Spurring near the thoracolumbar junction. IMPRESSION: 1. Nonobstructive bowel gas pattern with moderate proximal colonic fecal material. 2. No acute cardiopulmonary disease. Electronically Signed   By: Lucrezia Europe M.D.   On: 03/13/2015 17:05    Assessment & Plan:   Aracelie was seen today for back pain.  Diagnoses and all orders for this visit:  Encounter for immunization  Essential hypertension- her blood pressure is adequately well controlled, lites and renal function are stable. -     CBC  with Differential/Platelet; Future -     Basic metabolic panel; Future -     Urinalysis, Routine w reflex microscopic (not at [facility]); Future  Atherosclerosis of native coronary artery without angina pectoris, unspecified whether native or transplanted heart- she has not recently had any symptoms of angina, her LDL is not well controlled so I will take measures to treat it, will also control her blood pressure and I have asked her to work on her lifestyle modifications.  Hyperlipidemia with target LDL less than 70- she has not been compliant with Lipitor and Zetia, her LDL is not at goal. I've asked her to restart Lipitor and Zetia. -     Lipid panel; Future -     TSH; Future  Proteinuria- this has improved -     Basic metabolic panel; Future -     Urinalysis, Routine w reflex microscopic (not at [facility]); Future  Depression with anxiety -     ALPRAZolam (XANAX) 0.25 MG tablet; Take 1 tablet (0.25 mg total) by mouth 2 (two) times daily as needed for anxiety.  Lumbar spondylosis with myelopathy- she did not tolerate hydrocodone so I've asked her to try Butrans patch for pain relief. I've also asked her to follow-up with neurosurgery to see if there is a surgical intervention that will help her with her pain and other symptoms. -     Ambulatory referral to Neurosurgery -     Buprenorphine (BUTRANS) 15 MCG/HR PTWK; Place 1 patch onto the skin once a week.  Other orders -     Flu Vaccine QUAD 36+ mos IM   I have discontinued Jacqueline Ferrell's diazepam. I am also having her start on Buprenorphine. Additionally, I am having her maintain her aspirin, Omega-3 Fatty Acids (FISH OIL PO), metoprolol succinate, ZETIA, atorvastatin, losartan-hydrochlorothiazide, docusate sodium, polyethylene glycol powder, hydrocortisone, and ALPRAZolam.  Meds ordered this encounter  Medications  . ALPRAZolam (XANAX) 0.25 MG tablet    Sig: Take 1 tablet (0.25 mg total) by mouth 2 (two) times daily as needed for anxiety.     Dispense:  60 tablet    Refill:  2  . Buprenorphine (BUTRANS) 15 MCG/HR PTWK    Sig: Place 1 patch onto the skin once a week.    Dispense:  4 patch    Refill:  5     Follow-up: Return in about 2 months (around 08/27/2015).  Scarlette Calico, MD

## 2015-07-04 DIAGNOSIS — Z7689 Persons encountering health services in other specified circumstances: Secondary | ICD-10-CM

## 2015-07-09 ENCOUNTER — Encounter: Payer: Self-pay | Admitting: Internal Medicine

## 2015-07-12 ENCOUNTER — Other Ambulatory Visit: Payer: Self-pay | Admitting: Internal Medicine

## 2015-07-29 ENCOUNTER — Encounter: Payer: Self-pay | Admitting: Internal Medicine

## 2015-07-30 ENCOUNTER — Encounter: Payer: Self-pay | Admitting: Internal Medicine

## 2015-08-24 ENCOUNTER — Other Ambulatory Visit: Payer: Self-pay | Admitting: Internal Medicine

## 2015-10-15 ENCOUNTER — Encounter: Payer: Self-pay | Admitting: Physical Medicine & Rehabilitation

## 2015-12-03 ENCOUNTER — Emergency Department (HOSPITAL_BASED_OUTPATIENT_CLINIC_OR_DEPARTMENT_OTHER)
Admit: 2015-12-03 | Discharge: 2015-12-03 | Disposition: A | Discharge status: Home / Self Care | Payer: Managed Care, Other (non HMO) | Attending: Emergency Medicine | Admitting: Emergency Medicine

## 2015-12-03 ENCOUNTER — Emergency Department (HOSPITAL_COMMUNITY)
Admission: EM | Admit: 2015-12-03 | Discharge: 2015-12-03 | Disposition: A | Discharge status: Home / Self Care | Payer: Self-pay | Attending: Emergency Medicine | Admitting: Emergency Medicine

## 2015-12-03 ENCOUNTER — Encounter (HOSPITAL_COMMUNITY): Payer: Self-pay | Admitting: Emergency Medicine

## 2015-12-03 DIAGNOSIS — Z7982 Long term (current) use of aspirin: Secondary | ICD-10-CM | POA: Insufficient documentation

## 2015-12-03 DIAGNOSIS — Z8679 Personal history of other diseases of the circulatory system: Secondary | ICD-10-CM | POA: Insufficient documentation

## 2015-12-03 DIAGNOSIS — Z791 Long term (current) use of non-steroidal anti-inflammatories (NSAID): Secondary | ICD-10-CM | POA: Insufficient documentation

## 2015-12-03 DIAGNOSIS — I10 Essential (primary) hypertension: Secondary | ICD-10-CM | POA: Insufficient documentation

## 2015-12-03 DIAGNOSIS — M79609 Pain in unspecified limb: Secondary | ICD-10-CM | POA: Diagnosis not present

## 2015-12-03 DIAGNOSIS — L03115 Cellulitis of right lower limb: Secondary | ICD-10-CM | POA: Insufficient documentation

## 2015-12-03 LAB — CBC WITH DIFFERENTIAL/PLATELET
Basophils Absolute: 0 10*3/uL (ref 0.0–0.1)
Basophils Relative: 0 %
EOS PCT: 0 %
Eosinophils Absolute: 0 10*3/uL (ref 0.0–0.7)
HCT: 37.4 % (ref 36.0–46.0)
HEMOGLOBIN: 12.7 g/dL (ref 12.0–15.0)
LYMPHS ABS: 1.6 10*3/uL (ref 0.7–4.0)
LYMPHS PCT: 17 %
MCH: 30.2 pg (ref 26.0–34.0)
MCHC: 34 g/dL (ref 30.0–36.0)
MCV: 88.8 fL (ref 78.0–100.0)
Monocytes Absolute: 0.9 10*3/uL (ref 0.1–1.0)
Monocytes Relative: 10 %
NEUTROS PCT: 73 %
Neutro Abs: 6.8 10*3/uL (ref 1.7–7.7)
Platelets: 153 10*3/uL (ref 150–400)
RBC: 4.21 MIL/uL (ref 3.87–5.11)
RDW: 13.8 % (ref 11.5–15.5)
WBC: 9.3 10*3/uL (ref 4.0–10.5)

## 2015-12-03 LAB — I-STAT CHEM 8, ED
BUN: 14 mg/dL (ref 6–20)
CALCIUM ION: 1.13 mmol/L (ref 1.12–1.23)
CHLORIDE: 99 mmol/L — AB (ref 101–111)
CREATININE: 0.8 mg/dL (ref 0.44–1.00)
GLUCOSE: 94 mg/dL (ref 65–99)
HCT: 41 % (ref 36.0–46.0)
Hemoglobin: 13.9 g/dL (ref 12.0–15.0)
Potassium: 2.7 mmol/L — CL (ref 3.5–5.1)
Sodium: 140 mmol/L (ref 135–145)
TCO2: 30 mmol/L (ref 0–100)

## 2015-12-03 LAB — I-STAT CG4 LACTIC ACID, ED: Lactic Acid, Venous: 1.28 mmol/L (ref 0.5–2.0)

## 2015-12-03 MED ORDER — SULFAMETHOXAZOLE-TRIMETHOPRIM 800-160 MG PO TABS: 1.0000 | ORAL_TABLET | Freq: Three times a day (TID) | ORAL | Status: AC

## 2015-12-03 MED ORDER — CEPHALEXIN 500 MG PO CAPS: 500.0000 mg | ORAL_CAPSULE | Freq: Four times a day (QID) | ORAL | Status: DC

## 2015-12-03 NOTE — Discharge Instructions (Signed)
Follow-up in 1-2 days for a wound recheck. Return for worsening redness or return of fevers.  Cellulitis Cellulitis is an infection of the skin and the tissue beneath it. The infected area is usually red and tender. Cellulitis occurs most often in the arms and lower legs.  CAUSES  Cellulitis is caused by bacteria that enter the skin through cracks or cuts in the skin. The most common types of bacteria that cause cellulitis are staphylococci and streptococci. SIGNS AND SYMPTOMS   Redness and warmth.  Swelling.  Tenderness or pain.  Fever. DIAGNOSIS  Your health care provider can usually determine what is wrong based on a physical exam. Blood tests may also be done. TREATMENT  Treatment usually involves taking an antibiotic medicine. HOME CARE INSTRUCTIONS   Take your antibiotic medicine as directed by your health care provider. Finish the antibiotic even if you start to feel better.  Keep the infected arm or leg elevated to reduce swelling.  Apply a warm cloth to the affected area up to 4 times per day to relieve pain.  Take medicines only as directed by your health care provider.  Keep all follow-up visits as directed by your health care provider. SEEK MEDICAL CARE IF:   You notice red streaks coming from the infected area.  Your red area gets larger or turns dark in color.  Your bone or joint underneath the infected area becomes painful after the skin has healed.  Your infection returns in the same area or another area.  You notice a swollen bump in the infected area.  You develop new symptoms.  You have a fever. SEEK IMMEDIATE MEDICAL CARE IF:   You feel very sleepy.  You develop vomiting or diarrhea.  You have a general ill feeling (malaise) with muscle aches and pains.   This information is not intended to replace advice given to you by your health care provider. Make sure you discuss any questions you have with your health care provider.   Document  Released: 04/07/2005 Document Revised: 03/19/2015 Document Reviewed: 09/13/2011 Elsevier Interactive Patient Education [employer].

## 2015-12-03 NOTE — ED Provider Notes (Signed)
CSN: [id number]     Arrival date & time 12/03/15  [id number] History   First MD Initiated Contact with Patient 12/03/15 [phone]     Chief Complaint  Patient presents with  . Leg Pain  . Cellulitis      Patient is a 50 y.o. female presenting with leg pain. The history is provided by the patient.  Leg Pain Associated symptoms: fever   Associated symptoms: no back pain   Patient presents with right lower extremity/lower leg pain swelling and redness for the last 5 days. Had fevers up to 102 around 4 days ago. Fevers improved but swelling and redness remain. Chronically somewhat immobile due to back pain. States back and bothering her but then her back improved leg started to hurt. She's had previous foot/ankle surgery on that side but that was remote. Occasional brief episodes of chest pain but states that whenever she gets anxious to have a brief episode. No pain now. Denies cough. Denies shortness of breath. Denies dysuria. Denies other source of fever. She states her lower legs are normal on due to her immobility but usually the right leg is not this much more swollen than the left side.  Past Medical History  Diagnosis Date  . Hypertension   . Nephrolithiasis   . Hyperlipidemia   . Obese   . Chest pain, exertional     lexiscan myoview in 1/12 with EF 52% and a small reversible defect in diagonal territory either due to ischemia or shifting breast attenuation. ECHO 50-55%, mild LAE, mild LVH, no significant valvular abnormalities  . Coronary artery disease   . Poor compliance with medication    Past Surgical History  Procedure Laterality Date  . Abdominal hysterectomy  2009  . Coronary angioplasty with stent placement     Family History  Problem Relation Age of Onset  . Heart attack Father 48    MI  . Heart disease Father   . Hypertension Other   . Hyperlipidemia Other   . Heart attack Cousin     5 first cousins with MI's in their 70s  . Coronary artery disease Neg Hx   . Stroke Neg  Hx   . Alcohol abuse Neg Hx   . Cancer Neg Hx   . Diabetes Neg Hx   . Early death Neg Hx   . Hypertension Mother   . Hyperlipidemia Sister   . Hypertension Brother   . Hypertension Brother    Social History  Substance Use Topics  . Smoking status: Never Smoker   . Smokeless tobacco: Never Used  . Alcohol Use: No     Comment: occasional   OB History    No data available     Review of Systems  Constitutional: Positive for fever. Negative for activity change and appetite change.  Eyes: Negative for pain.  Respiratory: Negative for chest tightness and shortness of breath.   Cardiovascular: Positive for chest pain. Negative for leg swelling.  Gastrointestinal: Negative for nausea, vomiting, abdominal pain and diarrhea.  Genitourinary: Negative for flank pain.  Musculoskeletal: Negative for back pain and neck stiffness.  Skin: Positive for color change. Negative for rash.  Neurological: Negative for weakness, numbness and headaches.  Psychiatric/Behavioral: Negative for behavioral problems.      Allergies  Lisinopril  Home Medications   Prior to Admission medications   Medication Sig Start Date End Date Taking? Authorizing Provider  acetaminophen (TYLENOL) 500 MG tablet Take 1,000 mg by mouth every 12 (twelve) hours as needed  for moderate pain.   Yes Historical Provider, MD  ALPRAZolam (XANAX) 0.25 MG tablet Take 1 tablet (0.25 mg total) by mouth 2 (two) times daily as needed for anxiety. 06/26/15  Yes Janith Lima, MD  aspirin 81 MG EC tablet Take 162 mg by mouth daily.    Yes Historical Provider, MD  atorvastatin (LIPITOR) 80 MG tablet TAKE 1 TABLET (80 MG TOTAL) BY MOUTH DAILY. 06/30/15  Yes Janith Lima, MD  Buprenorphine (BUTRANS) 15 MCG/HR PTWK Place 1 patch onto the skin once a week. Patient taking differently: Place 1 patch onto the skin. Apply once weekly as needed for pain 06/26/15  Yes Janith Lima, MD  ezetimibe (ZETIA) 10 MG tablet Take 1 tablet (10 mg  total) by mouth daily. 06/30/15  Yes Janith Lima, MD  ibuprofen (ADVIL,MOTRIN) 200 MG tablet Take 800 mg by mouth every 12 (twelve) hours as needed for moderate pain.   Yes Historical Provider, MD  losartan-hydrochlorothiazide (HYZAAR) 100-25 MG tablet TAKE 1 TABLET BY MOUTH DAILY. 08/26/15  Yes Janith Lima, MD  metoprolol succinate (TOPROL-XL) 25 MG 24 hr tablet TAKE 1 TABLET BY MOUTH EVERY DAY Patient taking differently: TAKE 25 MG BY MOUTH DAILY 07/14/14  Yes Janith Lima, MD  Omega-3 Fatty Acids (FISH OIL PO) Take 3 capsules by mouth daily.    Yes Historical Provider, MD  polyethylene glycol powder (GLYCOLAX/MIRALAX) powder Take 17 g by mouth daily. Patient taking differently: Take 17 g by mouth daily as needed for mild constipation.  03/13/15  Yes Heather Laisure, PA-C  cephALEXin (KEFLEX) 500 MG capsule Take 1 capsule (500 mg total) by mouth 4 (four) times daily. 12/03/15   Davonna Belling, MD  sulfamethoxazole-trimethoprim (BACTRIM DS,SEPTRA DS) 800-160 MG tablet Take 1 tablet by mouth 3 (three) times daily. 12/03/15 12/10/15  Davonna Belling, MD   BP 123/66 mmHg  Pulse 93  Temp(Src) 98.3 F (36.8 C) (Oral)  Resp 18  SpO2 98%  LMP 02/10/2008 Physical Exam  Constitutional: She appears well-developed.  Patient is obese  HENT:  Head: Normocephalic.  Eyes: Pupils are equal, round, and reactive to light.  Cardiovascular: Normal rate.   Pulmonary/Chest: Effort normal.  Abdominal: Soft. There is no tenderness.  Musculoskeletal: She exhibits edema.  Bilateral lower extremity pitting edema but much worse on right side. Erythema from lower knee area down to foot. Pulse intact. Scars from previous surgery foot. There is one area of erythema medially on the lower aspect of the foot.  Neurological: She is alert.  Skin: Skin is warm. There is erythema.    ED Course  Procedures (including critical care time) Labs Review Labs Reviewed  I-STAT CHEM 8, ED - Abnormal; Notable for the  following:    Potassium 2.7 (*)    Chloride 99 (*)    All other components within normal limits  CBC WITH DIFFERENTIAL/PLATELET  I-STAT CG4 LACTIC ACID, ED    Imaging Review No results found. I have personally reviewed and evaluated these images and lab results as part of my medical decision-making.   EKG Interpretation None      MDM   Final diagnoses:  Cellulitis of right lower extremity    Patient was swelling and erythema of right lower leg. Doppler reassuring but somewhat poor quality. Labs reassuring. At this point I think it is reasonable for outpatient treatment area will attempt antibiotics. If worsens will return and will follow-up in one to 2 days for recheck either here or the primary  care's office.    Davonna Belling, MD 12/03/15 1017

## 2015-12-03 NOTE — Progress Notes (Signed)
*  Preliminary Results* Right lower extremity venous duplex completed. Patient was unable to tolerate compression maneuvers throughout most of the right lower extremity. Therefore, this study was texchnically difficult and limited. Unable to definitively exclude deep vein thrombosis in the right lower extremity due to poor patient cooperation, however the visualized veins of the right lower extremity are patent by color flow Doppler.   Incidental finding: There are multiple heterogenous areas of the right groin, suggestive of possible enlarged inguinal lymph nodes.  12/03/2015 9:38 AM  Maudry Mayhew, RVT, RDCS, RDMS

## 2015-12-03 NOTE — ED Notes (Signed)
US at bedside

## 2015-12-03 NOTE — ED Notes (Addendum)
Patient presents for right right leg pain x2 days. 2" area on posterior foot reddened and painful, redness radiating up right lower leg, extremity is warm to touch. Patient reports 102.8 temp Saturday evening. Right lower leg is moderately swollen, patient reports extremity is swollen at baseline. Dorsalis pedis pulses to right foot intact.

## 2016-02-24 ENCOUNTER — Telehealth: Payer: Self-pay | Admitting: Internal Medicine

## 2016-02-24 NOTE — Telephone Encounter (Signed)
I scheduled the patient for an MMR tomorrow. I wanted to verify that we dont need to order the MMR?

## 2016-02-24 NOTE — Telephone Encounter (Signed)
I do currently have 6 MMR vaccines, so we should be good for patient tomorrow---thanks for checking/scheduling

## 2016-02-25 ENCOUNTER — Ambulatory Visit (INDEPENDENT_AMBULATORY_CARE_PROVIDER_SITE_OTHER): Payer: Self-pay | Admitting: *Deleted

## 2016-02-25 ENCOUNTER — Other Ambulatory Visit: Payer: Self-pay | Admitting: Internal Medicine

## 2016-02-25 DIAGNOSIS — Z23 Encounter for immunization: Secondary | ICD-10-CM

## 2016-02-25 DIAGNOSIS — Z0000 Encounter for general adult medical examination without abnormal findings: Secondary | ICD-10-CM

## 2016-02-25 NOTE — Telephone Encounter (Signed)
Pt came to have her MMR & TB done today. 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 She states she is positive that she had chicken pox when she was little...Jacqueline Ferrell

## 2016-02-25 NOTE — Telephone Encounter (Signed)
I have ordered a chicken pox titer for her

## 2016-02-25 NOTE — Telephone Encounter (Signed)
Notified pt w/MD response.../lmb 

## 2016-02-26 ENCOUNTER — Other Ambulatory Visit: Payer: Self-pay

## 2016-02-26 DIAGNOSIS — Z0000 Encounter for general adult medical examination without abnormal findings: Secondary | ICD-10-CM

## 2016-02-27 ENCOUNTER — Encounter: Payer: Self-pay | Admitting: Internal Medicine

## 2016-02-27 LAB — TB SKIN TEST
INDURATION: 0 mm
TB Skin Test: NEGATIVE

## 2016-02-27 LAB — VARICELLA ZOSTER ANTIBODY, IGG: VARICELLA IGG: 2126 {index} — AB (ref <135.00)

## 2016-04-04 ENCOUNTER — Encounter (HOSPITAL_COMMUNITY): Payer: Self-pay | Admitting: Emergency Medicine

## 2016-04-04 ENCOUNTER — Emergency Department (HOSPITAL_COMMUNITY)
Admission: EM | Admit: 2016-04-04 | Discharge: 2016-04-04 | Disposition: A | Discharge status: Home / Self Care | Payer: Self-pay | Attending: Emergency Medicine | Admitting: Emergency Medicine

## 2016-04-04 DIAGNOSIS — I2510 Atherosclerotic heart disease of native coronary artery without angina pectoris: Secondary | ICD-10-CM | POA: Insufficient documentation

## 2016-04-04 DIAGNOSIS — G8929 Other chronic pain: Secondary | ICD-10-CM | POA: Insufficient documentation

## 2016-04-04 DIAGNOSIS — R531 Weakness: Secondary | ICD-10-CM | POA: Insufficient documentation

## 2016-04-04 DIAGNOSIS — I10 Essential (primary) hypertension: Secondary | ICD-10-CM | POA: Insufficient documentation

## 2016-04-04 DIAGNOSIS — M549 Dorsalgia, unspecified: Secondary | ICD-10-CM | POA: Insufficient documentation

## 2016-04-04 DIAGNOSIS — Z79899 Other long term (current) drug therapy: Secondary | ICD-10-CM | POA: Insufficient documentation

## 2016-04-04 DIAGNOSIS — Z7982 Long term (current) use of aspirin: Secondary | ICD-10-CM | POA: Insufficient documentation

## 2016-04-04 HISTORY — DX: Other intervertebral disc displacement, lumbar region: M51.26

## 2016-04-04 HISTORY — DX: Other intervertebral disc degeneration, lumbar region: M51.36

## 2016-04-04 HISTORY — DX: Unspecified osteoarthritis, unspecified site: M19.90

## 2016-04-04 HISTORY — DX: Other intervertebral disc degeneration, lumbar region without mention of lumbar back pain or lower extremity pain: M51.369

## 2016-04-04 MED ORDER — PREDNISONE 20 MG PO TABS: 40.0000 mg | ORAL_TABLET | Freq: Every day | ORAL | 0 refills | Status: DC

## 2016-04-04 MED ORDER — PREDNISONE 20 MG PO TABS
60.0000 mg | ORAL_TABLET | Freq: Once | ORAL | Status: AC
  Administered 2016-04-04: 60 mg via ORAL
  Filled 2016-04-04: qty 3

## 2016-04-04 NOTE — ED Notes (Signed)
Pt wheeled to visitors car and ambulatory and independent at discharge.  Verbalized understanding of discharge instructions.

## 2016-04-04 NOTE — Discharge Instructions (Signed)
You need to have an MRI of your low back and need to follow-up with the back specialist.  We have provided your information to our care management team, who may be able to assist you in seeing the specialist.  If you do not hear from them tomorrow, please call Joellyn Quails at [phone].  She is available from 7am-3pm on weekdays.

## 2016-04-04 NOTE — ED Triage Notes (Addendum)
Pt from home with complaints of back pain. Pt states she has been diagnosed with a bulging disc in her back. Pt states she has been feeling pins and needles in her legs a long with weakness/numbness since Thursday. Pt states she sometimes feels this in her arms too. Pt denies any episodes of incontinence but does report urinary frequency. Pt states she has been falling excessively due to this, but has incurred no major injuries secondary to this. Pt had a normal neuro exam

## 2016-04-04 NOTE — ED Provider Notes (Signed)
[facility] DEPT Provider Note   CSN: [id number] Arrival date & time: 04/04/16  1837     History   Chief Complaint Chief Complaint  Patient presents with  . Back Pain  . Extremity Weakness    HPI Jacqueline Ferrell is a 50 y.o. female.  Patient presents to the ED with a chief complaint of lower extremity weakness.  She states that over the past 4 days, she has had multiple falls due to lower extremity weakness.  She reports intermittent episodes of urinary incontinence.  She states that sometimes she is incontinent and doesn't know, other times it is a lack of ability to get to the bathroom in time.  She denies any fevers or chills.  She states that she saw Dr. Ronnald Ramp and Dr. Christella Noa about 2 years ago for the same and attempted to do physical therapy, but ultimately ended up losing her job and insurance and access to care.  She denies any other associated symptoms.  She states that she fall daily because of this.   The history is provided by the patient. No language interpreter was used.    Past Medical History:  Diagnosis Date  . Arthritis   . Bulging lumbar disc   . Chest pain, exertional    lexiscan myoview in 1/12 with EF 52% and a small reversible defect in diagonal territory either due to ischemia or shifting breast attenuation. ECHO 50-55%, mild LAE, mild LVH, no significant valvular abnormalities  . Coronary artery disease   . Hyperlipidemia   . Hypertension   . Nephrolithiasis   . Obese   . Poor compliance with medication     Patient Active Problem List   Diagnosis Date Noted  . Lumbar spondylosis with myelopathy 08/22/2014  . Other constipation 07/31/2014  . Spinal stenosis of lumbar region 02/19/2014  . Morbid obesity ([place]) 10/09/2013  . Routine general medical examination at a health care facility 09/13/2013  . Visit for screening mammogram 09/13/2013  . DJD (degenerative joint disease) of knee 01/27/2012  . Depression with anxiety 01/27/2012  . OSA  (obstructive sleep apnea) 12/24/2011  . Hypertension 01/04/2011  . CAD, NATIVE VESSEL 09/28/2010  . Hyperlipidemia with target LDL less than 70 07/28/2010  . Hyperglycemia 07/28/2010  . Proteinuria 07/28/2010    Past Surgical History:  Procedure Laterality Date  . ABDOMINAL HYSTERECTOMY  2009  . CORONARY ANGIOPLASTY WITH STENT PLACEMENT      OB History    No data available       Home Medications    Prior to Admission medications   Medication Sig Start Date End Date Taking? Authorizing Provider  acetaminophen (TYLENOL) 500 MG tablet Take 1,000 mg by mouth every 12 (twelve) hours as needed for moderate pain.    Historical Provider, MD  ALPRAZolam Duanne Moron) 0.25 MG tablet Take 1 tablet (0.25 mg total) by mouth 2 (two) times daily as needed for anxiety. 06/26/15   Janith Lima, MD  aspirin 81 MG EC tablet Take 162 mg by mouth daily.     Historical Provider, MD  atorvastatin (LIPITOR) 80 MG tablet TAKE 1 TABLET (80 MG TOTAL) BY MOUTH DAILY. 06/30/15   Janith Lima, MD  Buprenorphine (BUTRANS) 15 MCG/HR PTWK Place 1 patch onto the skin once a week. Patient taking differently: Place 1 patch onto the skin. Apply once weekly as needed for pain 06/26/15   Janith Lima, MD  cephALEXin (KEFLEX) 500 MG capsule Take 1 capsule (500 mg total) by mouth 4 (  four) times daily. 12/03/15   Davonna Belling, MD  ezetimibe (ZETIA) 10 MG tablet Take 1 tablet (10 mg total) by mouth daily. 06/30/15   Janith Lima, MD  ibuprofen (ADVIL,MOTRIN) 200 MG tablet Take 800 mg by mouth every 12 (twelve) hours as needed for moderate pain.    Historical Provider, MD  losartan-hydrochlorothiazide (HYZAAR) 100-25 MG tablet TAKE 1 TABLET BY MOUTH DAILY. 08/26/15   Janith Lima, MD  metoprolol succinate (TOPROL-XL) 25 MG 24 hr tablet TAKE 1 TABLET BY MOUTH EVERY DAY Patient taking differently: TAKE 25 MG BY MOUTH DAILY 07/14/14   Janith Lima, MD  Omega-3 Fatty Acids (FISH OIL PO) Take 3 capsules by mouth daily.      Historical Provider, MD  polyethylene glycol powder (GLYCOLAX/MIRALAX) powder Take 17 g by mouth daily. Patient taking differently: Take 17 g by mouth daily as needed for mild constipation.  03/13/15   Hyman Bible, PA-C    Family History Family History  Problem Relation Age of Onset  . Heart attack Father 58    MI  . Heart disease Father   . Hypertension Mother   . Hyperlipidemia Sister   . Hypertension Brother   . Hypertension Brother   . Hypertension Other   . Hyperlipidemia Other   . Heart attack Cousin     5 first cousins with MI's in their 78s  . Coronary artery disease Neg Hx   . Stroke Neg Hx   . Alcohol abuse Neg Hx   . Cancer Neg Hx   . Diabetes Neg Hx   . Early death Neg Hx     Social History Social History  Substance Use Topics  . Smoking status: Never Smoker  . Smokeless tobacco: Never Used  . Alcohol use No     Comment: occasional     Allergies   Lisinopril   Review of Systems Review of Systems  Neurological: Positive for weakness.  All other systems reviewed and are negative.    Physical Exam Updated Vital Signs BP 163/86 (BP Location: Right Arm) Comment: XXL CUFF  Pulse 85   Temp 98.3 F (36.8 C) (Oral)   Resp 18   Ht 5\' 8"  (1.727 m)   Wt (!) 152.4 kg   LMP 02/10/2008   SpO2 100%   BMI 51.09 kg/m   Physical Exam Physical Exam  Constitutional: Pt appears well-developed and well-nourished. No distress.  HENT:  Head: Normocephalic and atraumatic.  Mouth/Throat: Oropharynx is clear and moist. No oropharyngeal exudate.  Eyes: Conjunctivae are normal.  Neck: Normal range of motion. Neck supple.  No meningismus Cardiovascular: Normal rate, regular rhythm and intact distal pulses.   Pulmonary/Chest: Effort normal and breath sounds normal. No respiratory distress. Pt has no wheezes.  Abdominal: Pt exhibits no distension Musculoskeletal:  Mild lumbar paraspinal muscles tender to palpation, no bony CTLS spine tenderness, deformity,  step-off, or crepitus Lymphadenopathy: Pt has no cervical adenopathy.  Neurological: Pt is alert and oriented Speech is clear and goal oriented, follows commands 4/5 strength in lower extremities bilaterally including dorsiflexion and plantar flexion, strong and equal grip strength Sensation intact Great toe extension intact Moves extremities without ataxia, coordination intact Ankle and knee jerk reflexes intact and symmetrical  Requires cane to walk Normal balance No Clonus Skin: Skin is warm and dry. No rash noted. Pt is not diaphoretic. No erythema.  Psychiatric: Pt has a normal mood and affect. Behavior is normal.  Nursing note and vitals reviewed.   ED Treatments /  Results  Labs (all labs ordered are listed, but only abnormal results are displayed) Labs Reviewed - No data to display  EKG  EKG Interpretation None       Radiology No results found.  Procedures Procedures (including critical care time)  Medications Ordered in ED Medications - No data to display   Initial Impression / Assessment and Plan / ED Course  I have reviewed the triage vital signs and the nursing notes.  Pertinent labs & imaging results that were available during my care of the patient were reviewed by me and considered in my medical decision making (see chart for details).  Clinical Course    Patient with back pain.  No neurological deficits and normal neuro exam.  Patient is ambulatory with cane.  No loss of bowel or bladder control.  Doubt cauda equina.  Denies fever,  doubt epidural abscess or other lesion. Recommend back exercises, stretching, RICE, and will treat with a short course of prednisone.    I emailed the case management team to see if they can assist the patient with follow-up to get an MRI.  Encouraged the patient that there could be a need for additional workup and/or imaging such as MRI, if the symptoms do not resolve. Patient advised that if the back pain does not  resolve, or radiates, this could progress to more serious conditions and is encouraged to follow-up with PCP or orthopedics within 2 weeks.     Final Clinical Impressions(s) / ED Diagnoses   Final diagnoses:  Chronic back pain  Weakness    New Prescriptions Discharge Medication List as of 04/04/2016  9:03 PM    START taking these medications   Details  predniSONE (DELTASONE) 20 MG tablet Take 2 tablets (40 mg total) by mouth daily. Take 40 mg by mouth daily for 3 days, then 20mg  by mouth daily for 3 days, then 10mg  daily for 3 days, Starting Sun 04/04/2016, Print         Montine Circle, PA-C 04/04/16 2144    Montine Circle, PA-C 04/04/16 2144    Quintella Reichert, MD 04/06/16 [phone]

## 2016-04-05 ENCOUNTER — Telehealth: Payer: Self-pay | Admitting: *Deleted

## 2016-04-06 ENCOUNTER — Telehealth: Payer: Self-pay | Admitting: *Deleted

## 2016-04-22 ENCOUNTER — Ambulatory Visit (INDEPENDENT_AMBULATORY_CARE_PROVIDER_SITE_OTHER): Payer: Self-pay | Admitting: Internal Medicine

## 2016-04-22 ENCOUNTER — Encounter: Payer: Self-pay | Admitting: Internal Medicine

## 2016-04-22 ENCOUNTER — Other Ambulatory Visit (INDEPENDENT_AMBULATORY_CARE_PROVIDER_SITE_OTHER): Payer: Self-pay

## 2016-04-22 VITALS — BP 128/80 | HR 81 | Temp 98.0°F | Resp 16 | Ht 68.0 in | Wt 359.0 lb

## 2016-04-22 DIAGNOSIS — I10 Essential (primary) hypertension: Secondary | ICD-10-CM

## 2016-04-22 DIAGNOSIS — R739 Hyperglycemia, unspecified: Secondary | ICD-10-CM

## 2016-04-22 DIAGNOSIS — E876 Hypokalemia: Secondary | ICD-10-CM

## 2016-04-22 DIAGNOSIS — Z23 Encounter for immunization: Secondary | ICD-10-CM

## 2016-04-22 DIAGNOSIS — E785 Hyperlipidemia, unspecified: Secondary | ICD-10-CM

## 2016-04-22 DIAGNOSIS — I2510 Atherosclerotic heart disease of native coronary artery without angina pectoris: Secondary | ICD-10-CM

## 2016-04-22 DIAGNOSIS — M48061 Spinal stenosis, lumbar region without neurogenic claudication: Secondary | ICD-10-CM

## 2016-04-22 LAB — LIPID PANEL
Cholesterol: 245 mg/dL — ABNORMAL HIGH (ref 0–200)
HDL: 37.9 mg/dL — ABNORMAL LOW (ref >39.00)
LDL Cholesterol: 189 mg/dL — ABNORMAL HIGH (ref 0–99)
NONHDL: 207.26
Total CHOL/HDL Ratio: 6
Triglycerides: 93 mg/dL (ref 0.0–149.0)
VLDL: 18.6 mg/dL (ref 0.0–40.0)

## 2016-04-22 LAB — BASIC METABOLIC PANEL
BUN: 16 mg/dL (ref 6–23)
CALCIUM: 9.2 mg/dL (ref 8.4–10.5)
CO2: 29 meq/L (ref 19–32)
Chloride: 104 mEq/L (ref 96–112)
Creatinine, Ser: 0.75 mg/dL (ref 0.40–1.20)
GFR: 105.56 mL/min (ref >60.00)
GLUCOSE: 97 mg/dL (ref 70–99)
Potassium: 3.6 mEq/L (ref 3.5–5.1)
SODIUM: 140 meq/L (ref 135–145)

## 2016-04-22 LAB — CBC WITH DIFFERENTIAL/PLATELET
Basophils Absolute: 0 10*3/uL (ref 0.0–0.1)
Basophils Relative: 0.5 % (ref 0.0–3.0)
EOS PCT: 0.2 % (ref 0.0–5.0)
Eosinophils Absolute: 0 10*3/uL (ref 0.0–0.7)
HCT: 40.6 % (ref 36.0–46.0)
HEMOGLOBIN: 13.7 g/dL (ref 12.0–15.0)
LYMPHS PCT: 23.9 % (ref 12.0–46.0)
Lymphs Abs: 2.1 10*3/uL (ref 0.7–4.0)
MCHC: 33.8 g/dL (ref 30.0–36.0)
MCV: 88.8 fl (ref 78.0–100.0)
MONO ABS: 0.7 10*3/uL (ref 0.1–1.0)
MONOS PCT: 7.7 % (ref 3.0–12.0)
Neutro Abs: 6 10*3/uL (ref 1.4–7.7)
Neutrophils Relative %: 67.7 % (ref 43.0–77.0)
Platelets: 237 10*3/uL (ref 150.0–400.0)
RBC: 4.57 Mil/uL (ref 3.87–5.11)
RDW: 13.5 % (ref 11.5–15.5)
WBC: 8.9 10*3/uL (ref 4.0–10.5)

## 2016-04-22 LAB — HEMOGLOBIN A1C: Hgb A1c MFr Bld: 6.2 % (ref 4.6–6.5)

## 2016-04-22 LAB — MAGNESIUM: MAGNESIUM: 1.6 mg/dL (ref 1.5–2.5)

## 2016-04-22 NOTE — Progress Notes (Signed)
Pre visit review using our clinic review tool, if applicable. No additional management support is needed unless otherwise documented below in the visit note. 

## 2016-04-22 NOTE — Progress Notes (Signed)
Subjective:  Patient ID: Jacqueline Ferrell, female    DOB: 1966-01-06  Age: 50 y.o. MRN: [id number]  CC: Back Pain; Hyperlipidemia; and Hypertension   HPI Jacqueline Ferrell presents for ongoing concerns about low back pain. She wants to have another MRI done and to be referred to neurosurgery. She states her back pain has been worsening over last 7 months after she fell. She complains that the back pain radiates into both legs and she is having weakness in both legs more on the right than the left. She says the weakness is causing her to feel like she is going to fall. She is taking acetaminophen and ibuprofen for symptom relief.  Outpatient Medications Prior to Visit  Medication Sig Dispense Refill  . acetaminophen (TYLENOL) 500 MG tablet Take 1,000 mg by mouth every 12 (twelve) hours as needed for moderate pain.    Marland Kitchen aspirin 81 MG EC tablet Take 162 mg by mouth daily.     Marland Kitchen ibuprofen (ADVIL,MOTRIN) 200 MG tablet Take 800 mg by mouth every 12 (twelve) hours as needed for moderate pain.    Marland Kitchen losartan-hydrochlorothiazide (HYZAAR) 100-25 MG tablet TAKE 1 TABLET BY MOUTH DAILY. 90 tablet 1  . metoprolol succinate (TOPROL-XL) 25 MG 24 hr tablet TAKE 1 TABLET BY MOUTH EVERY DAY (Patient taking differently: TAKE 25 MG BY MOUTH DAILY) 90 tablet 1  . Omega-3 Fatty Acids (FISH OIL PO) Take 3 capsules by mouth daily.     Marland Kitchen ALPRAZolam (XANAX) 0.25 MG tablet Take 1 tablet (0.25 mg total) by mouth 2 (two) times daily as needed for anxiety. 60 tablet 2  . atorvastatin (LIPITOR) 80 MG tablet TAKE 1 TABLET (80 MG TOTAL) BY MOUTH DAILY. 90 tablet 1  . Buprenorphine (BUTRANS) 15 MCG/HR PTWK Place 1 patch onto the skin once a week. (Patient taking differently: Place 1 patch onto the skin. Apply once weekly as needed for pain) 4 patch 5  . cephALEXin (KEFLEX) 500 MG capsule Take 1 capsule (500 mg total) by mouth 4 (four) times daily. 28 capsule 0  . polyethylene glycol powder (GLYCOLAX/MIRALAX) powder Take 17 g by  mouth daily. (Patient taking differently: Take 17 g by mouth daily as needed for mild constipation. ) 255 g 0  . ezetimibe (ZETIA) 10 MG tablet Take 1 tablet (10 mg total) by mouth daily. (Patient not taking: Reported on 04/22/2016) 90 tablet 2  . predniSONE (DELTASONE) 20 MG tablet Take 2 tablets (40 mg total) by mouth daily. Take 40 mg by mouth daily for 3 days, then 20mg  by mouth daily for 3 days, then 10mg  daily for 3 days (Patient not taking: Reported on 04/22/2016) 12 tablet 0   No facility-administered medications prior to visit.     ROS Review of Systems  Constitutional: Negative for activity change, appetite change, diaphoresis, fatigue and fever.  HENT: Negative.   Eyes: Negative.  Negative for visual disturbance.  Respiratory: Negative.  Negative for cough, choking, chest tightness, shortness of breath and stridor.   Cardiovascular: Negative.  Negative for chest pain, palpitations and leg swelling.  Gastrointestinal: Negative.  Negative for abdominal pain, blood in stool, constipation, diarrhea, nausea and vomiting.  Endocrine: Negative.   Genitourinary: Negative.   Musculoskeletal: Positive for back pain. Negative for joint swelling and myalgias.  Skin: Negative.  Negative for color change and rash.  Allergic/Immunologic: Negative.   Neurological: Positive for weakness. Negative for dizziness, tremors, light-headedness, numbness and headaches.  Hematological: Negative.  Negative for adenopathy. Does not  bruise/bleed easily.  Psychiatric/Behavioral: Negative.     Objective:  BP 128/80 (BP Location: Left Arm, Patient Position: Sitting, Cuff Size: Large)   Pulse 81   Temp 98 F (36.7 C) (Oral)   Resp 16   Ht 5\' 8"  (1.727 m)   Wt (!) 359 lb (162.8 kg)   LMP 02/10/2008   SpO2 97%   BMI 54.59 kg/m   BP Readings from Last 3 Encounters:  04/24/16 159/87  04/22/16 128/80  04/04/16 195/100    Wt Readings from Last 3 Encounters:  04/22/16 (!) 359 lb (162.8 kg)  04/04/16  (!) 336 lb (152.4 kg)  06/26/15 (!) 342 lb (155.1 kg)    Physical Exam  Constitutional: She is oriented to person, place, and time. No distress.  HENT:  Mouth/Throat: Oropharynx is clear and moist. No oropharyngeal exudate.  Eyes: Conjunctivae are normal. Right eye exhibits no discharge. Left eye exhibits no discharge. No scleral icterus.  Neck: Normal range of motion. Neck supple. No JVD present. No tracheal deviation present. No thyromegaly present.  Cardiovascular: Normal rate, regular rhythm, normal heart sounds and intact distal pulses.  Exam reveals no gallop and no friction rub.   No murmur heard. Pulmonary/Chest: Effort normal and breath sounds normal. No stridor. No respiratory distress. She has no wheezes. She has no rales. She exhibits no tenderness.  Abdominal: Soft. Bowel sounds are normal. She exhibits no distension and no mass. There is no tenderness. There is no rebound and no guarding.  Musculoskeletal: Normal range of motion. She exhibits no edema, tenderness or deformity.  Lymphadenopathy:    She has no cervical adenopathy.  Neurological: She is alert and oriented to person, place, and time. She displays no atrophy. No cranial nerve deficit or sensory deficit. She exhibits normal muscle tone. She displays a negative Romberg sign. She displays no seizure activity. Coordination and gait normal. She displays no Babinski's sign on the right side. She displays no Babinski's sign on the left side.  Neg SLR in BLE  Skin: Skin is warm and dry. No rash noted. She is not diaphoretic. No erythema. No pallor.  Psychiatric: She has a normal mood and affect. Her behavior is normal. Judgment and thought content normal.  Vitals reviewed.   Lab Results  Component Value Date   WBC 8.8 04/24/2016   HGB 13.2 04/24/2016   HCT 39.8 04/24/2016   PLT 219 04/24/2016   GLUCOSE 96 04/24/2016   CHOL 245 (H) 04/22/2016   TRIG 93.0 04/22/2016   HDL 37.90 (L) 04/22/2016   LDLCALC 189 (H)  04/22/2016   ALT 24 04/09/2014   AST 35 04/09/2014   NA 141 04/24/2016   K 3.4 (L) 04/24/2016   CL 107 04/24/2016   CREATININE 0.77 04/24/2016   BUN 14 04/24/2016   CO2 24 04/24/2016   TSH 2.67 06/26/2015   INR 1.1 (H) 09/11/2010   HGBA1C 6.2 04/22/2016    No results found.  Assessment & Plan:   Vernae was seen today for back pain, hyperlipidemia and hypertension.  Diagnoses and all orders for this visit:  Essential hypertension- Her blood pressures adequately well-controlled, her potassium level is low so I will replace it with an oral supplement -     CBC with Differential/Platelet; Future -     Magnesium; Future  Hyperglycemia- her A1c is 6.2%, she is prediabetic, no medications are needed at this time. -     Basic metabolic panel; Future -     Hemoglobin A1c; Future  Spinal stenosis of lumbar region without neurogenic claudication -     MR Lumbar Spine Wo Contrast; Future -     Ambulatory referral to Neurosurgery  Hypokalemia- this is caused by the diuretic therapy, will replace with an oral supplement. -     Basic metabolic panel; Future -     Magnesium; Future  Atherosclerosis of native coronary artery of native heart without angina pectoris- she is had no recent episodes of angina, will continue to mitigate her risk factors with statin therapy and blood pressure control. -     Lipid panel; Future -     atorvastatin (LIPITOR) 80 MG tablet; TAKE 1 TABLET (80 MG TOTAL) BY MOUTH DAILY.  Hyperlipidemia with target LDL less than 70- she has not taken the statin or Zetia recently, she tells me that he is too expensive, she is not achieved her LDL goal so asked her to restart statin therapy. -     Lipid panel; Future -     atorvastatin (LIPITOR) 80 MG tablet; TAKE 1 TABLET (80 MG TOTAL) BY MOUTH DAILY.  Need for prophylactic vaccination and inoculation against influenza -     Flu Vaccine QUAD 36+ mos IM  Need for prophylactic vaccination with combined  diphtheria-tetanus-pertussis (DTP) vaccine -     Tdap vaccine greater than or equal to 7yo IM  Atherosclerosis of native coronary artery without angina pectoris, unspecified whether native or transplanted heart   I have discontinued Ms. Stettner's polyethylene glycol powder, ALPRAZolam, Buprenorphine, ezetimibe, cephALEXin, and predniSONE. I am also having her maintain her aspirin, Omega-3 Fatty Acids (FISH OIL PO), metoprolol succinate, losartan-hydrochlorothiazide, acetaminophen, ibuprofen, and atorvastatin.  Meds ordered this encounter  Medications  . atorvastatin (LIPITOR) 80 MG tablet    Sig: TAKE 1 TABLET (80 MG TOTAL) BY MOUTH DAILY.    Dispense:  90 tablet    Refill:  3     Follow-up: Return in about 3 months (around 07/23/2016).  Scarlette Calico, MD

## 2016-04-22 NOTE — Patient Instructions (Signed)

## 2016-04-24 ENCOUNTER — Encounter (HOSPITAL_COMMUNITY): Payer: Self-pay | Admitting: *Deleted

## 2016-04-24 ENCOUNTER — Emergency Department (HOSPITAL_COMMUNITY)
Admission: EM | Admit: 2016-04-24 | Discharge: 2016-04-24 | Disposition: A | Discharge status: Home / Self Care | Payer: Self-pay | Attending: Emergency Medicine | Admitting: Emergency Medicine

## 2016-04-24 ENCOUNTER — Emergency Department (HOSPITAL_COMMUNITY): Payer: Self-pay

## 2016-04-24 DIAGNOSIS — Z7982 Long term (current) use of aspirin: Secondary | ICD-10-CM | POA: Insufficient documentation

## 2016-04-24 DIAGNOSIS — I10 Essential (primary) hypertension: Secondary | ICD-10-CM | POA: Insufficient documentation

## 2016-04-24 DIAGNOSIS — M4800 Spinal stenosis, site unspecified: Secondary | ICD-10-CM

## 2016-04-24 DIAGNOSIS — Z955 Presence of coronary angioplasty implant and graft: Secondary | ICD-10-CM | POA: Insufficient documentation

## 2016-04-24 DIAGNOSIS — M48061 Spinal stenosis, lumbar region without neurogenic claudication: Secondary | ICD-10-CM | POA: Insufficient documentation

## 2016-04-24 LAB — BASIC METABOLIC PANEL
ANION GAP: 10 (ref 5–15)
BUN: 14 mg/dL (ref 6–20)
CALCIUM: 9.2 mg/dL (ref 8.9–10.3)
CHLORIDE: 107 mmol/L (ref 101–111)
CO2: 24 mmol/L (ref 22–32)
Creatinine, Ser: 0.77 mg/dL (ref 0.44–1.00)
GFR calc Af Amer: >60 mL/min (ref >60)
GLUCOSE: 96 mg/dL (ref 65–99)
POTASSIUM: 3.4 mmol/L — AB (ref 3.5–5.1)
Sodium: 141 mmol/L (ref 135–145)

## 2016-04-24 LAB — CBC WITH DIFFERENTIAL/PLATELET
BASOS ABS: 0 10*3/uL (ref 0.0–0.1)
Basophils Relative: 0 %
EOS PCT: 1 %
Eosinophils Absolute: 0 10*3/uL (ref 0.0–0.7)
HEMATOCRIT: 39.8 % (ref 36.0–46.0)
Hemoglobin: 13.2 g/dL (ref 12.0–15.0)
LYMPHS ABS: 2.5 10*3/uL (ref 0.7–4.0)
LYMPHS PCT: 28 %
MCH: 29.7 pg (ref 26.0–34.0)
MCHC: 33.2 g/dL (ref 30.0–36.0)
MCV: 89.6 fL (ref 78.0–100.0)
MONO ABS: 0.5 10*3/uL (ref 0.1–1.0)
Monocytes Relative: 6 %
NEUTROS ABS: 5.8 10*3/uL (ref 1.7–7.7)
Neutrophils Relative %: 65 %
Platelets: 219 10*3/uL (ref 150–400)
RBC: 4.44 MIL/uL (ref 3.87–5.11)
RDW: 13.3 % (ref 11.5–15.5)
WBC: 8.8 10*3/uL (ref 4.0–10.5)

## 2016-04-24 LAB — URINALYSIS, ROUTINE W REFLEX MICROSCOPIC
Bilirubin Urine: NEGATIVE
GLUCOSE, UA: NEGATIVE mg/dL
HGB URINE DIPSTICK: NEGATIVE
Ketones, ur: NEGATIVE mg/dL
LEUKOCYTES UA: NEGATIVE
Nitrite: NEGATIVE
PROTEIN: NEGATIVE mg/dL
SPECIFIC GRAVITY, URINE: 1.03 (ref 1.005–1.030)
pH: 7.5 (ref 5.0–8.0)

## 2016-04-24 LAB — CK: Total CK: 135 U/L (ref 38–234)

## 2016-04-24 MED ORDER — HYDROCODONE-ACETAMINOPHEN 5-325 MG PO TABS: ORAL_TABLET | ORAL | 0 refills | Status: DC

## 2016-04-24 MED ORDER — ONDANSETRON 4 MG PO TBDP
4.0000 mg | ORAL_TABLET | Freq: Once | ORAL | Status: AC
  Administered 2016-04-24: 4 mg via ORAL
  Filled 2016-04-24: qty 1

## 2016-04-24 MED ORDER — SENNOSIDES-DOCUSATE SODIUM 8.6-50 MG PO TABS
1.0000 | ORAL_TABLET | Freq: Once | ORAL | Status: AC
Start: 2016-04-24 — End: 2016-04-24
  Administered 2016-04-24: 1 via ORAL
  Filled 2016-04-24: qty 1

## 2016-04-24 MED ORDER — MORPHINE SULFATE (PF) 4 MG/ML IV SOLN
6.0000 mg | Freq: Once | INTRAVENOUS | Status: AC
  Administered 2016-04-24: 6 mg via INTRAMUSCULAR
  Filled 2016-04-24: qty 2

## 2016-04-24 MED ORDER — SENNOSIDES-DOCUSATE SODIUM 8.6-50 MG PO TABS: 1.0000 | ORAL_TABLET | Freq: Every day | ORAL | 0 refills | Status: DC

## 2016-04-24 MED ORDER — LIDOCAINE 5 % EX PTCH
1.0000 | MEDICATED_PATCH | Freq: Once | CUTANEOUS | Status: DC
  Administered 2016-04-24: 1 via TRANSDERMAL
  Filled 2016-04-24: qty 1

## 2016-04-24 MED ORDER — LIDOCAINE 4 % EX CREA: 1.0000 "application " | TOPICAL_CREAM | Freq: Three times a day (TID) | CUTANEOUS | 0 refills | Status: DC | PRN

## 2016-04-24 NOTE — ED Notes (Signed)
Pt was able to ambulate with her cane with no assistance. Pt was a little slow getting up, but once up she was able to walk. Pt stated her pain was better now. PA was able to witness pt walking at this time.

## 2016-04-24 NOTE — ED Provider Notes (Signed)
[facility] DEPT Provider Note   CSN: [id number] Arrival date & time: 04/24/16  0118     History   Chief Complaint Chief Complaint  Patient presents with  . Numbness    HPI  Blood pressure (!) 167/105, pulse 71, temperature 98.2 F (36.8 C), temperature source Oral, resp. rate 19, last menstrual period 02/10/2008, SpO2 100 %.  Jacqueline Ferrell is a 50 y.o. female presenting with difficulty ambulating worsening over the course of the last 2 weeks, states that her legs sometimes give out on her she also sometimes feels a pins and needles paresthesia down both legs worse on the right than the left she also states that at some points the legs hence she can't unclench them. She's been having back pain for over one year, she seen her primary care doctor Dr. Ronnald Ramp and neurosurgeon Dr. Cyndy Freeze. She hasn't had any recent head trauma but has had frequent falls, she feels now at this point that she can no longer leave the house, she normally ambulates with a cane or with a walker on her lower level however, had a fall yesterday and couldn't stand up for multiple hours, she lives with her daughter and her brother however they were not home. She has been incontinent to urine she states that sometimes is because she can't make it to the bathroom quick enough and sometimes she just looks down and she is wet and has no sensation that she needed to urinate. She denies fevers, chills, nausea, vomiting, history of cancer, history of IV drug use, perineal anesthesia, headache, pain above the low back with chest pain. Has had prednisone taper in the past with some relief, she's also been given a prescription for Vicodin however she is afraid it will constipate her and she hasn't taken that.   HPI  Past Medical History:  Diagnosis Date  . Arthritis   . Bulging lumbar disc   . Chest pain, exertional    lexiscan myoview in 1/12 with EF 52% and a small reversible defect in diagonal territory either due to  ischemia or shifting breast attenuation. ECHO 50-55%, mild LAE, mild LVH, no significant valvular abnormalities  . Coronary artery disease   . Hyperlipidemia   . Hypertension   . Nephrolithiasis   . Obese   . Poor compliance with medication     Patient Active Problem List   Diagnosis Date Noted  . Lumbar spondylosis with myelopathy 08/22/2014  . Other constipation 07/31/2014  . Spinal stenosis of lumbar region 02/19/2014  . Morbid obesity ([place]) 10/09/2013  . Routine general medical examination at a health care facility 09/13/2013  . Visit for screening mammogram 09/13/2013  . Hypokalemia 01/27/2012  . DJD (degenerative joint disease) of knee 01/27/2012  . Depression with anxiety 01/27/2012  . OSA (obstructive sleep apnea) 12/24/2011  . Hypertension 01/04/2011  . CAD, NATIVE VESSEL 09/28/2010  . Hyperlipidemia with target LDL less than 70 07/28/2010  . Hyperglycemia 07/28/2010  . Proteinuria 07/28/2010    Past Surgical History:  Procedure Laterality Date  . ABDOMINAL HYSTERECTOMY  2009  . CORONARY ANGIOPLASTY WITH STENT PLACEMENT      OB History    No data available       Home Medications    Prior to Admission medications   Medication Sig Start Date End Date Taking? Authorizing Provider  acetaminophen (TYLENOL) 500 MG tablet Take 1,000 mg by mouth every 12 (twelve) hours as needed for moderate pain.    Historical Provider, MD  aspirin  81 MG EC tablet Take 162 mg by mouth daily.     Historical Provider, MD  atorvastatin (LIPITOR) 80 MG tablet TAKE 1 TABLET (80 MG TOTAL) BY MOUTH DAILY. 06/30/15   Janith Lima, MD  HYDROcodone-acetaminophen (NORCO/VICODIN) 5-325 MG tablet Take 1-2 tablets by mouth every 6 hours as needed for pain. 04/24/16   Nami Strawder, PA-C  ibuprofen (ADVIL,MOTRIN) 200 MG tablet Take 800 mg by mouth every 12 (twelve) hours as needed for moderate pain.    Historical Provider, MD  lidocaine (LMX) 4 % cream Apply 1 application topically 3 (three)  times daily as needed. 04/24/16   Shanikia Kernodle, PA-C  losartan-hydrochlorothiazide (HYZAAR) 100-25 MG tablet TAKE 1 TABLET BY MOUTH DAILY. 08/26/15   Janith Lima, MD  metoprolol succinate (TOPROL-XL) 25 MG 24 hr tablet TAKE 1 TABLET BY MOUTH EVERY DAY Patient taking differently: TAKE 25 MG BY MOUTH DAILY 07/14/14   Janith Lima, MD  Omega-3 Fatty Acids (FISH OIL PO) Take 3 capsules by mouth daily.     Historical Provider, MD  senna-docusate (SENOKOT-S) 8.6-50 MG tablet Take 1 tablet by mouth daily. 04/24/16   Elmyra Ricks Lateisha Thurlow, PA-C    Family History Family History  Problem Relation Age of Onset  . Heart attack Father 39    MI  . Heart disease Father   . Hypertension Mother   . Hyperlipidemia Sister   . Hypertension Brother   . Hypertension Brother   . Hypertension Other   . Hyperlipidemia Other   . Heart attack Cousin     5 first cousins with MI's in their 7s  . Coronary artery disease Neg Hx   . Stroke Neg Hx   . Alcohol abuse Neg Hx   . Cancer Neg Hx   . Diabetes Neg Hx   . Early death Neg Hx     Social History Social History  Substance Use Topics  . Smoking status: Never Smoker  . Smokeless tobacco: Never Used  . Alcohol use No     Comment: occasional     Allergies   Lisinopril   Review of Systems Review of Systems    Physical Exam Updated Vital Signs BP 180/77   Pulse 78   Temp 98.2 F (36.8 C) (Oral)   Resp 19   LMP 02/10/2008   SpO2 98%   Physical Exam  Constitutional: She is oriented to person, place, and time. She appears well-developed and well-nourished. No distress.  Morbidly obese  HENT:  Head: Normocephalic and atraumatic.  Mouth/Throat: Oropharynx is clear and moist.  Eyes: Conjunctivae and EOM are normal. Pupils are equal, round, and reactive to light.  Neck: Normal range of motion.  Cardiovascular: Normal rate, regular rhythm and intact distal pulses.   Pulmonary/Chest: Effort normal and breath sounds normal.  Abdominal:  Soft. There is no tenderness.  Musculoskeletal: Normal range of motion.  Neurological: She is alert and oriented to person, place, and time.  No point tenderness to percussion of lumbar spinal processes.  No TTP or paraspinal muscular spasm. Strength is 5 out of 5 to bilateral lower extremities at hip and knee; extensor hallucis longus 5 out of 5. Ankle strength 5 out of 5, no clonus, neurovascularly intact. No saddle anaesthesia. Patellar reflexes are 2+ bilaterally.    Ambulatory with cane  Skin: She is not diaphoretic.  Psychiatric: She has a normal mood and affect.  Nursing note and vitals reviewed.    ED Treatments / Results  Labs (all labs ordered  are listed, but only abnormal results are displayed) Labs Reviewed  URINALYSIS, ROUTINE W REFLEX MICROSCOPIC (NOT AT [facility]) - Abnormal; Notable for the following:       Result Value   APPearance CLOUDY (*)    All other components within normal limits  BASIC METABOLIC PANEL - Abnormal; Notable for the following:    Potassium 3.4 (*)    All other components within normal limits  CBC WITH DIFFERENTIAL/PLATELET  CK    EKG  EKG Interpretation None       Radiology Mr Lumbar Spine Wo Contrast  Result Date: 04/24/2016 CLINICAL DATA:  Back pain and urinary incontinence. Legs keep giving out. Recent diagnosis of disc disease. EXAM: MRI LUMBAR SPINE WITHOUT CONTRAST TECHNIQUE: Multiplanar, multisequence MR imaging of the lumbar spine was performed. No intravenous contrast was administered. COMPARISON:  MRI of the lumbar spine 04/03/2014 FINDINGS: Segmentation: 5 non rib-bearing lumbar type vertebral bodies are present. Alignment: Grade 1 anterolisthesis is present at L4-5. Alignment is otherwise anatomic. Levoconvex curvature is centered at L2. Vertebrae: Chronic endplate marrow changes have progressed at L4-5 and L5-S1. Conus medullaris: Extends to the L1-2 level and appears normal. Paraspinal and other soft tissues: A 1.7 cm  benign-appearing cysts is present in the left kidney. Limited imaging of the abdomen is otherwise unremarkable. No significant adenopathy is present. Disc levels: L1-2:  Negative. L2-3: Mild facet hypertrophy is present bilaterally without significant disc protrusion or stenosis. L3-4: A far left lateral disc protrusion has progressed. Moderate left subarticular and foraminal stenosis is new. L4-5: There is progression of a broad-based disc protrusion and bilateral facet hypertrophy with now severe central canal stenosis. Moderate foraminal narrowing is present bilaterally. L5-S1: A broad-based disc protrusion is present moderate facet hypertrophy is noted bilaterally. Moderate subarticular and foraminal stenosis has progressed bilaterally. IMPRESSION: 1. Progressive broad-based disc protrusion and advanced facet hypertrophy at L4-5 with now severe central canal stenosis. 2. Moderate foraminal stenosis is now present bilaterally at L4-5. 3. Moderate subarticular and bilateral foraminal stenosis at L5-S1 has progressed with a progressive broad-based disc protrusion and bilateral facet hypertrophy. 4. Progressive far left lateral disc protrusion at L3-4 with now moderate left subarticular and foraminal narrowing. Electronically Signed   By: San Morelle M.D.   On: 04/24/2016 08:32    Procedures Procedures (including critical care time)  Medications Ordered in ED Medications  lidocaine (LIDODERM) 5 % 1 patch (1 patch Transdermal Patch Applied 04/24/16 0658)  senna-docusate (Senokot-S) tablet 1 tablet (not administered)  morphine 4 MG/ML injection 6 mg (6 mg Intramuscular Given 04/24/16 0722)  ondansetron (ZOFRAN-ODT) disintegrating tablet 4 mg (4 mg Oral Given 04/24/16 0723)     Initial Impression / Assessment and Plan / ED Course  I have reviewed the triage vital signs and the nursing notes.  Pertinent labs & imaging results that were available during my care of the patient were reviewed by  me and considered in my medical decision making (see chart for details).  Clinical Course    Vitals:   04/24/16 0727 04/24/16 0821 04/24/16 0830 04/24/16 0845  BP: 164/85 164/90 181/94 180/77  Pulse: 78 77 83 78  Resp:      Temp:      TempSrc:      SpO2: 99% 98% 98% 98%    Medications  lidocaine (LIDODERM) 5 % 1 patch (1 patch Transdermal Patch Applied 04/24/16 0658)  senna-docusate (Senokot-S) tablet 1 tablet (not administered)  morphine 4 MG/ML injection 6 mg (6 mg Intramuscular Given 04/24/16  [id number])  ondansetron (ZOFRAN-ODT) disintegrating tablet 4 mg (4 mg Oral Given 04/24/16 0723)    TACY BREDESEN is 50 y.o. female presenting with Exacerbation of her chronic low back pain, her falls a been increasing to the point where she had 2 hours on the floor where she couldn't get up yesterday until someone came to help her up. She is also reporting a urinary incontinence, sometimes she states that she cannot get to the bathroom fast enough and other times she does not register that she has the urge to urinate and she finds herself wet. Lower extremity strength and sensation is intact. Patient afebrile and overall well appearing. Given the constellation of incontinence and multiple falls will obtain MRI.  Patient is reporting improvement in her pain with the morphine. She is ambulated in the ED with a cane. MRI with spinal stenosis, discussed with neurosurgeon Dr. Cyndy Freeze who recommends at this patient follow-up with Dr. Cyndy Freeze as an outpatient.  Evaluation does not show pathology that would require ongoing emergent intervention or inpatient treatment. Pt is hemodynamically stable and mentating appropriately. Discussed findings and plan with patient/guardian, who agrees with care plan. All questions answered. Return precautions discussed and outpatient follow up given.      Final Clinical Impressions(s) / ED Diagnoses   Final diagnoses:  Spinal stenosis, unspecified spinal region    New  Prescriptions New Prescriptions   HYDROCODONE-ACETAMINOPHEN (NORCO/VICODIN) 5-325 MG TABLET    Take 1-2 tablets by mouth every 6 hours as needed for pain.   LIDOCAINE (LMX) 4 % CREAM    Apply 1 application topically 3 (three) times daily as needed.   SENNA-DOCUSATE (SENOKOT-S) 8.6-50 MG TABLET    Take 1 tablet by mouth daily.     Monico Blitz, PA-C 04/24/16 [place], MD 04/24/16 [phone]

## 2016-04-24 NOTE — Discharge Instructions (Signed)
Take vicodin for breakthrough pain, do not drink alcohol, drive, care for children or do other critical tasks while taking vicodin. ° °Please follow with your primary care doctor in the next 2 days for a check-up. They must obtain records for further management.  ° °Do not hesitate to return to the Emergency Department for any new, worsening or concerning symptoms.  ° °

## 2016-04-24 NOTE — ED Notes (Signed)
Pt assisted to bedside commode

## 2016-04-24 NOTE — ED Triage Notes (Signed)
Pt states she was dx with bulging disk two weeks ago at [facility], pt states she fell out 2000 tonight because her legs keep giving out. Pt could not get up until her daughter came home. Pt reports bilateral leg numbness and tingling for two weeks.

## 2016-04-25 MED ORDER — ATORVASTATIN CALCIUM 80 MG PO TABS: ORAL_TABLET | ORAL | 3 refills | Status: DC

## 2016-04-26 ENCOUNTER — Other Ambulatory Visit: Payer: Self-pay | Admitting: Internal Medicine

## 2016-04-26 ENCOUNTER — Encounter: Payer: Self-pay | Admitting: Internal Medicine

## 2016-04-26 DIAGNOSIS — M48062 Spinal stenosis, lumbar region with neurogenic claudication: Secondary | ICD-10-CM

## 2016-04-26 MED ORDER — HYDROCODONE-ACETAMINOPHEN 5-325 MG PO TABS: ORAL_TABLET | ORAL | 0 refills | Status: DC

## 2016-05-10 ENCOUNTER — Other Ambulatory Visit: Payer: Self-pay

## 2016-05-17 ENCOUNTER — Encounter (HOSPITAL_COMMUNITY): Payer: Self-pay

## 2016-05-17 ENCOUNTER — Emergency Department (HOSPITAL_COMMUNITY)
Admission: EM | Admit: 2016-05-17 | Discharge: 2016-05-17 | Disposition: A | Discharge status: Home / Self Care | Payer: Self-pay | Attending: Emergency Medicine | Admitting: Emergency Medicine

## 2016-05-17 DIAGNOSIS — M545 Low back pain: Secondary | ICD-10-CM | POA: Insufficient documentation

## 2016-05-17 DIAGNOSIS — Z7982 Long term (current) use of aspirin: Secondary | ICD-10-CM | POA: Insufficient documentation

## 2016-05-17 DIAGNOSIS — I10 Essential (primary) hypertension: Secondary | ICD-10-CM | POA: Insufficient documentation

## 2016-05-17 DIAGNOSIS — G8929 Other chronic pain: Secondary | ICD-10-CM | POA: Insufficient documentation

## 2016-05-17 MED ORDER — PREDNISONE 20 MG PO TABS
60.0000 mg | ORAL_TABLET | Freq: Once | ORAL | Status: AC
  Administered 2016-05-17: 60 mg via ORAL
  Filled 2016-05-17: qty 3

## 2016-05-17 MED ORDER — KETOROLAC TROMETHAMINE 60 MG/2ML IM SOLN
60.0000 mg | Freq: Once | INTRAMUSCULAR | Status: AC
  Administered 2016-05-17: 60 mg via INTRAMUSCULAR
  Filled 2016-05-17: qty 2

## 2016-05-17 MED ORDER — MORPHINE SULFATE (PF) 4 MG/ML IV SOLN
4.0000 mg | Freq: Once | INTRAVENOUS | Status: AC
  Administered 2016-05-17: 4 mg via INTRAMUSCULAR
  Filled 2016-05-17: qty 1

## 2016-05-17 NOTE — ED Provider Notes (Signed)
[facility] DEPT Provider Note    By signing my name below, I, Bea Graff, attest that this documentation has been prepared under the direction and in the presence of Shawn Joy, PA-C. Electronically Signed: Bea Graff, ED Scribe. 05/17/16. 2:20 PM.    History   Chief Complaint Chief Complaint  Patient presents with  . Back Pain    The history is provided by the patient and medical records. No language interpreter was used.    HPI Comments:  Jacqueline Ferrell is a morbidly obese 50 y.o. female with PMHx of bulging lumbar disc and chronic back pain who presents to the Emergency Department complaining of worsening low back pain that has been ongoing for quite some time after a fall. She states she saw a neurosurgeon, Dr. Ashok Pall, last week and is supposed to have "further testing" that has not yet been scheduled. She had an MRI of the lumbar spine in October 2017 showing multiple issues. She reports taking Vicodin with minimal relief of the pain. Movements increase her pain. Lying down used to help alleviate the pain but now she states that is not helping. She denies any other modifying factors. She denies tingling or weakness of any extremity, bruising, wounds, changes in bowel or bladder function. She denies any recent trauma, injury or fall.   Patient states that Dr. Christella Noa told her he would like to be contacted if she ever had to go to the ED.   Past Medical History:  Diagnosis Date  . Arthritis   . Bulging lumbar disc   . Chest pain, exertional    lexiscan myoview in 1/12 with EF 52% and a small reversible defect in diagonal territory either due to ischemia or shifting breast attenuation. ECHO 50-55%, mild LAE, mild LVH, no significant valvular abnormalities  . Coronary artery disease   . Hyperlipidemia   . Hypertension   . Nephrolithiasis   . Obese   . Poor compliance with medication     Patient Active Problem List   Diagnosis Date Noted  . Spinal  stenosis, lumbar region, with neurogenic claudication 08/22/2014  . Other constipation 07/31/2014  . Spinal stenosis of lumbar region 02/19/2014  . Morbid obesity ([place]) 10/09/2013  . Routine general medical examination at a health care facility 09/13/2013  . Visit for screening mammogram 09/13/2013  . Hypokalemia 01/27/2012  . DJD (degenerative joint disease) of knee 01/27/2012  . Depression with anxiety 01/27/2012  . OSA (obstructive sleep apnea) 12/24/2011  . Hypertension 01/04/2011  . CAD, NATIVE VESSEL 09/28/2010  . Hyperlipidemia with target LDL less than 70 07/28/2010  . Hyperglycemia 07/28/2010  . Proteinuria 07/28/2010    Past Surgical History:  Procedure Laterality Date  . ABDOMINAL HYSTERECTOMY  2009  . CORONARY ANGIOPLASTY WITH STENT PLACEMENT      OB History    No data available       Home Medications    Prior to Admission medications   Medication Sig Start Date End Date Taking? Authorizing Provider  acetaminophen (TYLENOL) 500 MG tablet Take 1,000 mg by mouth every 12 (twelve) hours as needed for moderate pain.    Historical Provider, MD  aspirin 81 MG EC tablet Take 162 mg by mouth daily.     Historical Provider, MD  atorvastatin (LIPITOR) 80 MG tablet TAKE 1 TABLET (80 MG TOTAL) BY MOUTH DAILY. 04/25/16   Janith Lima, MD  HYDROcodone-acetaminophen (NORCO/VICODIN) 5-325 MG tablet Take 1-2 tablets by mouth every 6 hours as needed for pain.  04/26/16   Janith Lima, MD  ibuprofen (ADVIL,MOTRIN) 200 MG tablet Take 800 mg by mouth every 12 (twelve) hours as needed for moderate pain.    Historical Provider, MD  lidocaine (LMX) 4 % cream Apply 1 application topically 3 (three) times daily as needed. 04/24/16   Nicole Pisciotta, PA-C  losartan-hydrochlorothiazide (HYZAAR) 100-25 MG tablet TAKE 1 TABLET BY MOUTH DAILY. 08/26/15   Janith Lima, MD  metoprolol succinate (TOPROL-XL) 25 MG 24 hr tablet TAKE 1 TABLET BY MOUTH EVERY DAY Patient taking differently: TAKE  25 MG BY MOUTH DAILY 07/14/14   Janith Lima, MD  Omega-3 Fatty Acids (FISH OIL PO) Take 3 capsules by mouth daily.     Historical Provider, MD  senna-docusate (SENOKOT-S) 8.6-50 MG tablet Take 1 tablet by mouth daily. 04/24/16   Elmyra Ricks Pisciotta, PA-C    Family History Family History  Problem Relation Age of Onset  . Heart attack Father 47    MI  . Heart disease Father   . Hypertension Mother   . Hyperlipidemia Sister   . Hypertension Brother   . Hypertension Brother   . Hypertension Other   . Hyperlipidemia Other   . Heart attack Cousin     5 first cousins with MI's in their 8s  . Coronary artery disease Neg Hx   . Stroke Neg Hx   . Alcohol abuse Neg Hx   . Cancer Neg Hx   . Diabetes Neg Hx   . Early death Neg Hx     Social History Social History  Substance Use Topics  . Smoking status: Never Smoker  . Smokeless tobacco: Never Used  . Alcohol use Yes     Comment: occasional     Allergies   Lisinopril   Review of Systems Review of Systems  Genitourinary:       No bowel or bladder incontinence  Musculoskeletal: Positive for back pain.  Skin: Negative for color change and wound.  Neurological: Negative for weakness.  All other systems reviewed and are negative.    Physical Exam Updated Vital Signs BP 151/98 (BP Location: Right Arm)   Pulse 65   Temp 98.1 F (36.7 C) (Oral)   Resp 23   LMP 02/10/2008   SpO2 99%   Physical Exam  Constitutional: She appears well-developed and well-nourished. No distress.  HENT:  Head: Normocephalic and atraumatic.  Eyes: Conjunctivae are normal.  Neck: Neck supple.  Cardiovascular: Normal rate and intact distal pulses.   Pulmonary/Chest: Effort normal. No respiratory distress.  Abdominal: Soft. There is no tenderness. There is no guarding.  Musculoskeletal: She exhibits tenderness. She exhibits no edema or deformity.  Tenderness to lumbar musculature, flanking the spine bilaterally. Motor function equally intact in  all extremities and spine. No midline spinal tenderness.   Lymphadenopathy:    She has no cervical adenopathy.  Neurological: She is alert.  No sensory deficits. Strength 5/5 in all extremities. Coordination intact. Cranial nerves III-XII grossly intact. Pt would not get out of wheelchair due to pain, however, patient ambulated after pain was managed.  Skin: Skin is warm and dry. She is not diaphoretic.  Psychiatric: She has a normal mood and affect. Her behavior is normal.  Nursing note and vitals reviewed.    ED Treatments / Results  DIAGNOSTIC STUDIES: Oxygen Saturation is 99% on RA, normal by my interpretation.   COORDINATION OF CARE: 1:21 PM- Will contact her neurosurgeon and order pain medication. Pt verbalizes understanding and agrees to plan.  1:45 PM- Contacted Dr. Lacy Duverney office.  2:18 PM- Spoke with Dr. Christella Noa and informed pt to contact his office, per his instructions. Pt states her pain is better since receiving medication in the ED.   Medications  morphine 4 MG/ML injection 4 mg (4 mg Intramuscular Given 05/17/16 1332)  ketorolac (TORADOL) injection 60 mg (60 mg Intramuscular Given 05/17/16 1332)  predniSONE (DELTASONE) tablet 60 mg (60 mg Oral Given 05/17/16 1332)    Labs (all labs ordered are listed, but only abnormal results are displayed) Labs Reviewed - No data to display  EKG  EKG Interpretation None       Radiology No results found.  Procedures Procedures (including critical care time)  Medications Ordered in ED Medications  morphine 4 MG/ML injection 4 mg (4 mg Intramuscular Given 05/17/16 1332)  ketorolac (TORADOL) injection 60 mg (60 mg Intramuscular Given 05/17/16 1332)  predniSONE (DELTASONE) tablet 60 mg (60 mg Oral Given 05/17/16 1332)     Initial Impression / Assessment and Plan / ED Course  I have reviewed the triage vital signs and the nursing notes.  Pertinent labs & imaging results that were available during my care of the patient  were reviewed by me and considered in my medical decision making (see chart for details).  Clinical Course     Patient presents with worsening chronic back pain. No neurologic deficits noted on exam. Patient was informed that she would have had higher yield for her complaint and she contacted Dr. Lacy Duverney office directly herself. Patient states that she did call his office, but reached a voicemail and did not leave a message. Dr. Lacy Duverney office was contacted on the patient's request.  2:45 PM Spoke with Dr. Christella Noa, who reiterated what I already communicated with the patient, that he told her to contact the office directly for any worsening pain rather than go to the ED. States patient should call the office upon ED discharge for follow-up appointment. This information was communicated to the patient. Patient voices understanding of all instructions and is comfortable with discharge.  Vitals:   05/17/16 1241 05/17/16 1450  BP: 151/98 175/90  Pulse: 65 63  Resp: 23   Temp: 98.1 F (36.7 C) 98.7 F (37.1 C)  TempSrc: Oral Oral  SpO2: 99% 99%    Final Clinical Impressions(s) / ED Diagnoses   Final diagnoses:  Chronic bilateral low back pain without sciatica    New Prescriptions Discharge Medication List as of 05/17/2016  2:21 PM       Lorayne Bender, PA-C 05/18/16 1615    Tanna Furry, MD 06/08/16 1523

## 2016-05-17 NOTE — ED Triage Notes (Signed)
Pt reports lower back pain. She reports she is having increased pain and unable to get comfortable. Pt reports lying down used to improve the pain however is no longer helping.

## 2016-05-17 NOTE — Discharge Instructions (Signed)
Follow-up with the neurosurgeon as soon as possible. Call the office upon discharge. Call the neurosurgeon's office for any future incidences of this type.

## 2016-05-17 NOTE — ED Notes (Signed)
Patient requests to remain in wheelchair due to pain on ambulation.

## 2016-07-15 ENCOUNTER — Other Ambulatory Visit: Payer: Self-pay | Admitting: Internal Medicine

## 2016-10-03 ENCOUNTER — Other Ambulatory Visit: Payer: Self-pay | Admitting: Internal Medicine

## 2016-10-03 DIAGNOSIS — E785 Hyperlipidemia, unspecified: Secondary | ICD-10-CM

## 2016-10-05 ENCOUNTER — Other Ambulatory Visit: Payer: Self-pay | Admitting: Internal Medicine

## 2016-10-05 DIAGNOSIS — E785 Hyperlipidemia, unspecified: Secondary | ICD-10-CM

## 2017-05-03 ENCOUNTER — Other Ambulatory Visit: Payer: Self-pay | Admitting: Internal Medicine

## 2017-05-03 ENCOUNTER — Encounter: Payer: Self-pay | Admitting: Internal Medicine

## 2017-05-03 DIAGNOSIS — I2510 Atherosclerotic heart disease of native coronary artery without angina pectoris: Secondary | ICD-10-CM

## 2017-05-03 NOTE — Progress Notes (Signed)
Cardiology Office Note   Date:  05/04/2017   ID:  Jacqueline Ferrell, DOB 08-31-1965, MRN [id number]  PCP:  Janith Lima, MD  Cardiologist:   Minus Breeding, MD    Chief Complaint  Patient presents with  . Pre-op Exam      History of Present Illness: Jacqueline Ferrell is a 51 y.o. female who presents for follow up of CAD.  She saw Dr. Marlou Porch greater than 3 years ago.   She had a drug-eluting stent to the LAD after progressive exertional chest pain and abnormal stress test. Her heart catheterization showed 80% proximal LAD stenosis and a stent was placed in March of 2012.  She is being seen because she is being considered for bariatric surgery. 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  She does not get the shoulder pain for thedyspnea that she was having but she does get some dyspnea with exertion. She's not getting any palpitations, presyncope or syncope. She describes no PND or orthopnea.   Past Medical History:  Diagnosis Date  . Arthritis   . Bulging lumbar disc   . Coronary artery disease   . Hyperlipidemia   . Hypertension   . Nephrolithiasis   . Obese   . Poor compliance with medication     Past Surgical History:  Procedure Laterality Date  . ABDOMINAL HYSTERECTOMY  2009  . CORONARY ANGIOPLASTY WITH STENT PLACEMENT       Current Outpatient Prescriptions  Medication Sig Dispense Refill  . acetaminophen (TYLENOL) 500 MG tablet Take 1,000 mg by mouth every 12 (twelve) hours as needed for moderate pain.    Marland Kitchen aspirin 81 MG EC tablet Take 162 mg by mouth daily.     Marland Kitchen atorvastatin (LIPITOR) 80 MG tablet TAKE 1 TABLET (80 MG TOTAL) BY MOUTH DAILY. 90 tablet 3  . HYDROcodone-acetaminophen (NORCO/VICODIN) 5-325 MG tablet Take 1-2 tablets by mouth every 6 hours as needed for pain. 90 tablet 0  . ibuprofen (ADVIL,MOTRIN) 200 MG tablet Take 800 mg by mouth every 12 (twelve) hours as needed for moderate pain.      Marland Kitchen lidocaine (LMX) 4 % cream Apply 1 application topically 3 (three) times daily as needed. 133 g 0  . losartan-hydrochlorothiazide (HYZAAR) 100-25 MG tablet TAKE 1 TABLET BY MOUTH DAILY. 90 tablet 1  . metoprolol succinate (TOPROL-XL) 25 MG 24 hr tablet TAKE 1 TABLET BY MOUTH EVERY DAY (Patient taking differently: TAKE 25 MG BY MOUTH DAILY) 90 tablet 1  . metoprolol succinate (TOPROL-XL) 25 MG 24 hr tablet TAKE 1 TABLET BY MOUTH EVERY DAY 90 tablet 1  . Omega-3 Fatty Acids (FISH OIL PO) Take 3 capsules by mouth daily.     Marland Kitchen senna-docusate (SENOKOT-S) 8.6-50 MG tablet Take 1 tablet by mouth daily. 30 tablet 0   No current facility-administered medications for this visit.     Allergies:   Lisinopril    Social History:  The patient  reports that she has never smoked. She has never used smokeless tobacco. She reports that she drinks alcohol. She reports that she does not use drugs.   Family History:  The patient's family history includes Diabetes in her daughter; Heart attack in her cousin; Heart attack (age of onset: 58) in her father; Heart disease in her father; Hyperlipidemia in her other and sister; Hypertension in her brother, brother, mother, and other.    ROS:  Please see the  history of present illness.   Otherwise, review of systems are positive for none.   All other systems are reviewed and negative.    PHYSICAL EXAM: VS:  BP 138/82   Pulse 85   Ht 5\' 7"  (1.702 m)   Wt (!) 357 lb (161.9 kg)   LMP 02/10/2008   BMI 55.91 kg/m  , BMI Body mass index is 55.91 kg/m. PHYSICAL EXAM GEN:  No distress NECK:  No jugular venous distention at 90 degrees, waveform within normal limits, carotid upstroke brisk and symmetric, no bruits, no thyromegaly LYMPHATICS:  No cervical adenopathy LUNGS:  Clear to auscultation bilaterally BACK:  No CVA tenderness CHEST:  Unremarkable HEART:  S1 and S2 within normal limits, no S3, no S4, no clicks, no rubs, no murmurs ABD:  Positive bowel sounds  normal in frequency in pitch, no bruits, no rebound, no guarding, unable to assess midline mass or bruit with the patient seated. EXT:  2 plus pulses throughout, moderate edema, no cyanosis no clubbing SKIN:  No rashes no nodules NEURO:  Cranial nerves II through XII grossly intact, motor grossly intact throughout PSYCH:  Cognitively intact, oriented to person place and time     EKG:  EKG is ordered today. The ekg ordered today demonstrates sinus rhythm, rate 85, left axis deviation, poor anterior R wave progression, no acute ST-T wave changes.   Recent Labs: No results found for requested labs within last 8760 hours.    Lipid Panel    Component Value Date/Time   CHOL 245 (H) 04/22/2016 1019   TRIG 93.0 04/22/2016 1019   TRIG 81 07/28/2010 1630   HDL 37.90 (L) 04/22/2016 1019   CHOLHDL 6 04/22/2016 1019   VLDL 18.6 04/22/2016 1019   LDLCALC 189 (H) 04/22/2016 1019      Wt Readings from Last 3 Encounters:  05/04/17 (!) 357 lb (161.9 kg)  04/22/16 (!) 359 lb (162.8 kg)  04/04/16 (!) 336 lb (152.4 kg)      Other studies Reviewed: Additional studies/ records that were reviewed today include: None. Review of the above records demonstrates:  Please see elsewhere in the note.     ASSESSMENT AND PLAN:    CAD:  This will be evaluated as above.    OBESITY:  I am proud of her for taking the step to consider weight loss surgery.    HTN:  The blood pressure is at target. No change in medications is indicated. We will continue with therapeutic lifestyle changes (TLC).  This will come down closer to target with weight loss.    HYPERLIPIDEMIA:    Her LDL in the past was 272.  She is still 189 on Lipitor high dose and she could not afford the Zetia.  She needs to be seen in the Lipid Clinic.  I will check a fasting lipid profile.   PREOP:  She has intermediate risk calculation and low functional level.  Preop screening with stress testing is indicated.  She would not be able to  walk and will have a [employer].   Current medicines are reviewed at length with the patient today.  The patient does not have concerns regarding medicines.  The following changes have been made:  no change  Labs/ tests ordered today include:  Orders Placed This Encounter  Procedures  . Lipid panel  . MYOCARDIAL PERFUSION IMAGING  . EKG 12-Lead     Disposition:   FU with me in 18 months or so.  Signed, Minus Breeding, MD  05/04/2017 12:29 PM    [facility]

## 2017-05-04 ENCOUNTER — Encounter: Payer: Self-pay | Admitting: Cardiology

## 2017-05-04 ENCOUNTER — Ambulatory Visit (INDEPENDENT_AMBULATORY_CARE_PROVIDER_SITE_OTHER): Payer: Self-pay | Admitting: Cardiology

## 2017-05-04 VITALS — BP 138/82 | HR 85 | Ht 67.0 in | Wt 357.0 lb

## 2017-05-04 DIAGNOSIS — I10 Essential (primary) hypertension: Secondary | ICD-10-CM

## 2017-05-04 DIAGNOSIS — I2510 Atherosclerotic heart disease of native coronary artery without angina pectoris: Secondary | ICD-10-CM

## 2017-05-04 DIAGNOSIS — Z01810 Encounter for preprocedural cardiovascular examination: Secondary | ICD-10-CM

## 2017-05-04 DIAGNOSIS — E785 Hyperlipidemia, unspecified: Secondary | ICD-10-CM

## 2017-05-04 NOTE — Patient Instructions (Signed)
Medication Instructions:  Continue current medications  If you need a refill on your cardiac medications before your next appointment, please call your pharmacy.  Labwork: Fasting Lipids HERE IN OUR OFFICE AT [facility]  Testing/Procedures: Your physician has requested that you have a lexiscan myoview. For further information please visit [email or web address]. Please follow instruction sheet, as given.  Follow-Up:  Your physician recommends that you schedule a follow-up appointment in: [place] physician wants you to follow-up in: 18 Months. You should receive a reminder letter in the mail two months in advance. If you do not receive a letter, please call our office [phone].   Thank you for choosing [facility] at [facility]!!

## 2017-05-10 ENCOUNTER — Telehealth (HOSPITAL_COMMUNITY): Payer: Self-pay

## 2017-05-10 NOTE — Telephone Encounter (Signed)
Encounter complete. 

## 2017-05-12 ENCOUNTER — Ambulatory Visit (HOSPITAL_COMMUNITY)
Admission: RE | Admit: 2017-05-12 | Discharge: 2017-05-12 | Disposition: A | Discharge status: Home / Self Care | Payer: Self-pay | Source: Ambulatory Visit | Attending: Internal Medicine | Admitting: Internal Medicine

## 2017-05-12 DIAGNOSIS — I2510 Atherosclerotic heart disease of native coronary artery without angina pectoris: Secondary | ICD-10-CM

## 2017-05-13 ENCOUNTER — Inpatient Hospital Stay (HOSPITAL_COMMUNITY): Admission: RE | Admit: 2017-05-13 | Payer: Self-pay | Source: Ambulatory Visit

## 2017-05-17 ENCOUNTER — Telehealth (HOSPITAL_COMMUNITY): Payer: Self-pay

## 2017-05-17 NOTE — Telephone Encounter (Signed)
Encounter complete. 

## 2017-05-19 ENCOUNTER — Ambulatory Visit (HOSPITAL_COMMUNITY)
Admission: RE | Admit: 2017-05-19 | Discharge: 2017-05-19 | Disposition: A | Discharge status: Home / Self Care | Payer: Self-pay | Source: Ambulatory Visit | Attending: Cardiology | Admitting: Cardiology

## 2017-05-19 DIAGNOSIS — Z01810 Encounter for preprocedural cardiovascular examination: Secondary | ICD-10-CM | POA: Insufficient documentation

## 2017-05-19 DIAGNOSIS — R0600 Dyspnea, unspecified: Secondary | ICD-10-CM | POA: Insufficient documentation

## 2017-05-19 DIAGNOSIS — I2510 Atherosclerotic heart disease of native coronary artery without angina pectoris: Secondary | ICD-10-CM | POA: Insufficient documentation

## 2017-05-19 MED ORDER — TECHNETIUM TC 99M TETROFOSMIN IV KIT
25.1000 | PACK | Freq: Once | INTRAVENOUS | Status: AC | PRN
  Administered 2017-05-19: 25.1 via INTRAVENOUS
  Filled 2017-05-19: qty 26

## 2017-05-19 MED ORDER — REGADENOSON 0.4 MG/5ML IV SOLN
0.4000 mg | Freq: Once | INTRAVENOUS | Status: AC
  Administered 2017-05-19: 0.4 mg via INTRAVENOUS

## 2017-05-20 ENCOUNTER — Ambulatory Visit (HOSPITAL_COMMUNITY)
Admission: RE | Admit: 2017-05-20 | Discharge: 2017-05-20 | Disposition: A | Discharge status: Home / Self Care | Payer: Self-pay | Source: Ambulatory Visit | Attending: Cardiovascular Disease | Admitting: Cardiovascular Disease

## 2017-05-20 LAB — MYOCARDIAL PERFUSION IMAGING
CHL CUP NUCLEAR SSS: 7
CHL CUP RESTING HR STRESS: 77 {beats}/min
LV sys vol: 29 mL
LVDIAVOL: 85 mL (ref 46–106)
Peak HR: 93 {beats}/min
SDS: 7
SRS: 0
TID: 0.8

## 2017-05-20 MED ORDER — TECHNETIUM TC 99M TETROFOSMIN IV KIT
24.9000 | PACK | Freq: Once | INTRAVENOUS | Status: AC | PRN
  Administered 2017-05-20: 24.9 via INTRAVENOUS

## 2017-05-24 ENCOUNTER — Ambulatory Visit (INDEPENDENT_AMBULATORY_CARE_PROVIDER_SITE_OTHER): Payer: Self-pay | Admitting: Pharmacist Clinician (PhC)/ Clinical Pharmacy Specialist

## 2017-05-24 DIAGNOSIS — E785 Hyperlipidemia, unspecified: Secondary | ICD-10-CM

## 2017-05-24 LAB — LIPID PANEL
CHOLESTEROL TOTAL: 201 mg/dL — AB (ref 100–199)
Chol/HDL Ratio: 5.7 ratio — ABNORMAL HIGH (ref 0.0–4.4)
HDL: 35 mg/dL — AB (ref >39)
LDL Calculated: 155 mg/dL — ABNORMAL HIGH (ref 0–99)
TRIGLYCERIDES: 56 mg/dL (ref 0–149)
VLDL Cholesterol Cal: 11 mg/dL (ref 5–40)

## 2017-05-24 NOTE — Patient Instructions (Signed)
It was nice to meet you today.  We have started the paperwork to see if the manufacturer will give you the Repatha at no cost for calendar year 2019.  If you hear anything from them please let us know.  They are [employer].   Once we get this approved for you, we can have you come into the office to teach you how to do the injection and give your first dose.   Evolocumab injection What is this medicine? EVOLOCUMAB (e voe LOK ue mab) is known as a PCSK9 inhibitor. It is used to lower the level of cholesterol in the blood. It may be used alone or in combination with other cholesterol-lowering drugs. This drug may also be used to reduce the risk of heart attack, stroke, and certain types of heart surgery in patients with heart disease. This medicine may be used for other purposes; ask your health care provider or pharmacist if you have questions. COMMON BRAND NAME(S): REPATHA What should I tell my health care provider before I take this medicine? They need to know if you have any of these conditions: -an unusual or allergic reaction to evolocumab, other medicines, foods, dyes, or preservatives -pregnant or trying to get pregnant -breast-feeding How should I use this medicine? This medicine is for injection under the skin. You will be taught how to prepare and give this medicine. Use exactly as directed. Take your medicine at regular intervals. Do not take your medicine more often than directed. It is important that you put your used needles and syringes in a special sharps container. Do not put them in a trash can. If you do not have a sharps container, call your pharmacist or health care provider to get one. Talk to your pediatrician regarding the use of this medicine in children. While this drug may be prescribed for children as young as 13 years for selected conditions, precautions do apply. Overdosage: If you think you have taken too much of this medicine contact a poison  control center or emergency room at once. NOTE: This medicine is only for you. Do not share this medicine with others. What if I miss a dose? If you miss a dose, take it as soon as you can if there are more than 7 days until the next scheduled dose, or skip the missed dose and take the next dose according to your original schedule. Do not take double or extra doses. What may interact with this medicine? Interactions are not expected. This list may not describe all possible interactions. Give your health care provider a list of all the medicines, herbs, non-prescription drugs, or dietary supplements you use. Also tell them if you smoke, drink alcohol, or use illegal drugs. Some items may interact with your medicine. What should I watch for while using this medicine? You may need blood work while you are taking this medicine. What side effects may I notice from receiving this medicine? Side effects that you should report to your doctor or health care professional as soon as possible: -allergic reactions like skin rash, itching or hives, swelling of the face, lips, or tongue -signs and symptoms of infection like fever or chills; cough; sore throat; pain or trouble passing urine Side effects that usually do not require medical attention (report to your doctor or health care professional if they continue or are bothersome): -diarrhea -nausea -muscle pain -pain, redness, or irritation at site where injected This list may not describe all possible side effects. Call  your doctor for medical advice about side effects. You may report side effects to FDA at 1-800-FDA-1088. Where should I keep my medicine? Keep out of the reach of children. You will be instructed on how to store this medicine. Throw away any unused medicine after the expiration date on the label. NOTE: This sheet is a summary. It may not cover all possible information. If you have questions about this medicine, talk to your doctor,  pharmacist, or health care provider.  2018 Elsevier/Gold Standard (2016-06-14 13:21:53)

## 2017-05-24 NOTE — Progress Notes (Signed)
05/25/2017 Jacqueline Ferrell [birth year] [id number]   HPI:  Jacqueline Ferrell is a 51 y.o. female patient of Dr Percival Spanish, who presents today for a lipid clinic evaluation.  Ms Tobler has a cardiac history significant for CAD with a DES to her LAD, hypertension and obesity.  She is also having problems with a bulging lumbar disc which is currently limiting her activity.   Her appointment with Dr. Percival Spanish was in part for surgical clearance, as she is considering bariatric surgery.    She is currently working, but uninsured, so has no prescription insurance coverage.    Current Medications:  Atorvastatin 80 mg qd  OTC fish oil 1 qd  Cholesterol Goals:   Intolerant/previously tried:  Ezetimibe - about 2 years ago, became cost prohibitive when she lost insurance  Family history:   Father - CAD, hypertension, stents placed while in his 26's  (passed at 42)  Several paternal first cousins with MI, not all survived  Mother - controlled hypertension, still living at 79  Brother with hypertension,   Sister with hyperlipidemia, tried PCSK-9 but afraid of shots   Daughter with hyperlipidemia and hypertension at 74  Diet:   Currently considering bariatric surgery.  Has looked at keto and low carb diets, but started nothing at this time.   Exercise:    Limited due to pinched nerve, has gained close to 30 pounds over past 2 years  Labs:   05/2017:  TC 201, TG 56, HDL 35, LDL 155 (atorvastatin 80 mg)  04/2016:  TC 245, TG 93, HDL 37.9, LDL 189  (no meds)  Current Outpatient Medications  Medication Sig Dispense Refill  . acetaminophen (TYLENOL) 500 MG tablet Take 1,000 mg by mouth every 12 (twelve) hours as needed for moderate pain.    Marland Kitchen aspirin 81 MG EC tablet Take 162 mg by mouth daily.     Marland Kitchen atorvastatin (LIPITOR) 80 MG tablet TAKE 1 TABLET (80 MG TOTAL) BY MOUTH DAILY. 90 tablet 3  . HYDROcodone-acetaminophen (NORCO/VICODIN) 5-325 MG tablet Take 1-2 tablets by mouth every 6 hours as needed for  pain. 90 tablet 0  . ibuprofen (ADVIL,MOTRIN) 200 MG tablet Take 800 mg by mouth every 12 (twelve) hours as needed for moderate pain.    Marland Kitchen lidocaine (LMX) 4 % cream Apply 1 application topically 3 (three) times daily as needed. 133 g 0  . losartan-hydrochlorothiazide (HYZAAR) 100-25 MG tablet TAKE 1 TABLET BY MOUTH DAILY. 90 tablet 1  . metoprolol succinate (TOPROL-XL) 25 MG 24 hr tablet TAKE 1 TABLET BY MOUTH EVERY DAY (Patient taking differently: TAKE 25 MG BY MOUTH DAILY) 90 tablet 1  . metoprolol succinate (TOPROL-XL) 25 MG 24 hr tablet TAKE 1 TABLET BY MOUTH EVERY DAY 90 tablet 1  . Omega-3 Fatty Acids (FISH OIL PO) Take 3 capsules by mouth daily.     Marland Kitchen senna-docusate (SENOKOT-S) 8.6-50 MG tablet Take 1 tablet by mouth daily. 30 tablet 0   No current facility-administered medications for this visit.     Allergies  Allergen Reactions  . Lisinopril Other (See Comments)    REACTION: Tongue swelling    Past Medical History:  Diagnosis Date  . Arthritis   . Bulging lumbar disc   . Coronary artery disease   . Hyperlipidemia   . Hypertension   . Nephrolithiasis   . Obese   . Poor compliance with medication     Last menstrual period 02/10/2008.   Hyperlipidemia with target LDL less than 70 Patient with  ASCVD and elevated LDL at 155, down from baseline at 189, currently on atorvastatin 80 mg daily.  Patient is uninsured, so there is no need for prior authorization.  Will submit a request directly to [facility] to determine if patient can get medication at no charge.     Tommy Medal PharmD CPP [facility]

## 2017-05-25 ENCOUNTER — Encounter: Payer: Self-pay | Admitting: Pharmacist Clinician (PhC)/ Clinical Pharmacy Specialist

## 2017-05-25 NOTE — Assessment & Plan Note (Signed)
Patient with ASCVD and elevated LDL at 155, down from baseline at 189, currently on atorvastatin 80 mg daily.  Patient is uninsured, so there is no need for prior authorization.  Will submit a request directly to [facility] to determine if patient can get medication at no charge.

## 2017-06-22 ENCOUNTER — Encounter (HOSPITAL_COMMUNITY): Payer: Self-pay | Admitting: *Deleted

## 2017-06-22 ENCOUNTER — Emergency Department (HOSPITAL_COMMUNITY)
Admission: EM | Admit: 2017-06-22 | Discharge: 2017-06-22 | Disposition: A | Discharge status: Home / Self Care | Payer: Self-pay | Attending: Emergency Medicine | Admitting: Emergency Medicine

## 2017-06-22 DIAGNOSIS — M549 Dorsalgia, unspecified: Secondary | ICD-10-CM | POA: Insufficient documentation

## 2017-06-22 DIAGNOSIS — Z5321 Procedure and treatment not carried out due to patient leaving prior to being seen by health care provider: Secondary | ICD-10-CM | POA: Insufficient documentation

## 2017-06-22 NOTE — ED Triage Notes (Signed)
No response when pt called for room x3

## 2017-06-22 NOTE — ED Notes (Signed)
Pt refused having her vitals reassessed.  She did not want to go into the little room in the lobby.

## 2017-06-22 NOTE — ED Triage Notes (Signed)
Pt w/ hx of bulging disc complains of back pain over the past couple days. Pt states her legs feel heavier than normal.

## 2017-06-22 NOTE — ED Notes (Signed)
Called for vital and no response

## 2017-06-23 ENCOUNTER — Emergency Department (HOSPITAL_COMMUNITY)
Admission: EM | Admit: 2017-06-23 | Discharge: 2017-06-24 | Disposition: A | Discharge status: Home / Self Care | Payer: Self-pay | Attending: Emergency Medicine | Admitting: Emergency Medicine

## 2017-06-23 ENCOUNTER — Encounter (HOSPITAL_COMMUNITY): Payer: Self-pay | Admitting: Emergency Medicine

## 2017-06-23 ENCOUNTER — Other Ambulatory Visit: Payer: Self-pay

## 2017-06-23 DIAGNOSIS — M545 Low back pain: Secondary | ICD-10-CM | POA: Insufficient documentation

## 2017-06-23 DIAGNOSIS — Z7982 Long term (current) use of aspirin: Secondary | ICD-10-CM | POA: Insufficient documentation

## 2017-06-23 DIAGNOSIS — I2510 Atherosclerotic heart disease of native coronary artery without angina pectoris: Secondary | ICD-10-CM | POA: Insufficient documentation

## 2017-06-23 DIAGNOSIS — I10 Essential (primary) hypertension: Secondary | ICD-10-CM | POA: Insufficient documentation

## 2017-06-23 DIAGNOSIS — M5410 Radiculopathy, site unspecified: Secondary | ICD-10-CM

## 2017-06-23 DIAGNOSIS — Z79899 Other long term (current) drug therapy: Secondary | ICD-10-CM | POA: Insufficient documentation

## 2017-06-23 DIAGNOSIS — G8929 Other chronic pain: Secondary | ICD-10-CM | POA: Insufficient documentation

## 2017-06-23 MED ORDER — DEXAMETHASONE SODIUM PHOSPHATE 10 MG/ML IJ SOLN
10.0000 mg | Freq: Once | INTRAMUSCULAR | Status: AC
  Administered 2017-06-24: 10 mg via INTRAVENOUS
  Filled 2017-06-23: qty 1

## 2017-06-23 MED ORDER — HYDROMORPHONE HCL 1 MG/ML IJ SOLN
1.0000 mg | Freq: Once | INTRAMUSCULAR | Status: AC
  Administered 2017-06-24: 1 mg via INTRAVENOUS
  Filled 2017-06-23: qty 1

## 2017-06-23 MED ORDER — HYDROMORPHONE HCL 1 MG/ML IJ SOLN
1.0000 mg | Freq: Once | INTRAMUSCULAR | Status: AC
  Administered 2017-06-23: 1 mg via INTRAVENOUS
  Filled 2017-06-23: qty 1

## 2017-06-23 MED ORDER — DIAZEPAM 5 MG PO TABS
5.0000 mg | ORAL_TABLET | Freq: Once | ORAL | Status: AC
  Administered 2017-06-23: 5 mg via ORAL
  Filled 2017-06-23: qty 1

## 2017-06-23 NOTE — ED Triage Notes (Signed)
Pt states she has bulging disc and pinched nerve in her back- chronic problem for 2 years. Pt states recently the pain has increased and feels the pain into her hips. Pt states she is in terrible pain and can hardly walk. Denies any recent injuries.

## 2017-06-23 NOTE — ED Notes (Signed)
Attempted IV, RN also attempted IV x2 without success

## 2017-06-24 ENCOUNTER — Other Ambulatory Visit: Payer: Self-pay | Admitting: Internal Medicine

## 2017-06-24 ENCOUNTER — Emergency Department (HOSPITAL_COMMUNITY): Payer: Self-pay

## 2017-06-24 LAB — CBC WITH DIFFERENTIAL/PLATELET
Basophils Absolute: 0 10*3/uL (ref 0.0–0.1)
Basophils Relative: 0 %
Eosinophils Absolute: 0.1 10*3/uL (ref 0.0–0.7)
Eosinophils Relative: 1 %
HCT: 40.1 % (ref 36.0–46.0)
Hemoglobin: 13.5 g/dL (ref 12.0–15.0)
Lymphocytes Relative: 31 %
Lymphs Abs: 2.6 10*3/uL (ref 0.7–4.0)
MCH: 30.3 pg (ref 26.0–34.0)
MCHC: 33.7 g/dL (ref 30.0–36.0)
MCV: 89.9 fL (ref 78.0–100.0)
Monocytes Absolute: 0.6 10*3/uL (ref 0.1–1.0)
Monocytes Relative: 8 %
Neutro Abs: 5 10*3/uL (ref 1.7–7.7)
Neutrophils Relative %: 60 %
Platelets: 235 10*3/uL (ref 150–400)
RBC: 4.46 MIL/uL (ref 3.87–5.11)
RDW: 13.7 % (ref 11.5–15.5)
WBC: 8.3 10*3/uL (ref 4.0–10.5)

## 2017-06-24 LAB — URINALYSIS, ROUTINE W REFLEX MICROSCOPIC
Bilirubin Urine: NEGATIVE
Glucose, UA: NEGATIVE mg/dL
Hgb urine dipstick: NEGATIVE
Ketones, ur: NEGATIVE mg/dL
Nitrite: NEGATIVE
Protein, ur: NEGATIVE mg/dL
Specific Gravity, Urine: 1.031 — ABNORMAL HIGH (ref 1.005–1.030)
pH: 5 (ref 5.0–8.0)

## 2017-06-24 LAB — BASIC METABOLIC PANEL WITH GFR
Anion gap: 9 (ref 5–15)
BUN: 11 mg/dL (ref 6–20)
CO2: 25 mmol/L (ref 22–32)
Calcium: 9.3 mg/dL (ref 8.9–10.3)
Chloride: 107 mmol/L (ref 101–111)
Creatinine, Ser: 0.8 mg/dL (ref 0.44–1.00)
GFR calc Af Amer: >60 mL/min
GFR calc non Af Amer: >60 mL/min
Glucose, Bld: 100 mg/dL — ABNORMAL HIGH (ref 65–99)
Potassium: 3.1 mmol/L — ABNORMAL LOW (ref 3.5–5.1)
Sodium: 141 mmol/L (ref 135–145)

## 2017-06-24 MED ORDER — POTASSIUM CHLORIDE CRYS ER 20 MEQ PO TBCR
40.0000 meq | EXTENDED_RELEASE_TABLET | Freq: Once | ORAL | Status: AC
  Administered 2017-06-24: 40 meq via ORAL
  Filled 2017-06-24: qty 2

## 2017-06-24 MED ORDER — LIDOCAINE 4 % EX CREA: 1.0000 "application " | TOPICAL_CREAM | Freq: Three times a day (TID) | CUTANEOUS | 0 refills | Status: DC | PRN

## 2017-06-24 MED ORDER — IBUPROFEN 200 MG PO TABS: 800.0000 mg | ORAL_TABLET | Freq: Two times a day (BID) | ORAL | 0 refills | Status: DC | PRN

## 2017-06-24 MED ORDER — CYCLOBENZAPRINE HCL 10 MG PO TABS: 10.0000 mg | ORAL_TABLET | Freq: Two times a day (BID) | ORAL | 0 refills | Status: DC | PRN

## 2017-06-24 MED ORDER — PREDNISONE 20 MG PO TABS: ORAL_TABLET | ORAL | 0 refills | Status: DC

## 2017-06-24 NOTE — ED Provider Notes (Signed)
[place] EMERGENCY DEPARTMENT Provider Note   CSN: [id number] Arrival date & time: 06/23/17  1455     History   Chief Complaint Chief Complaint  Patient presents with  . Back Pain    HPI Jacqueline Ferrell is a 51 y.o. female.  HPI Patient presents to the emergency department with lower back pain that is chronic but has worsened over the last 2 weeks.  The patient states that she is having difficulty walking, and weakness in her legs.  Patient states that she had previously been seen by back specialist but has not seen them in several years.  Patient states that her legs feel heavy.  She states she does have increased swelling in her lower extremities due to the fact that she is having to sleep sitting up in a chair due to her back pain.  The patient states that she normally has swelling in her lower extremities but it is worse at this time.  The patient states that nothing seems to make the condition better but certain movements and palpation make the pain worse. Past Medical History:  Diagnosis Date  . Arthritis   . Bulging lumbar disc   . Coronary artery disease   . Hyperlipidemia   . Hypertension   . Nephrolithiasis   . Obese   . Poor compliance with medication     Patient Active Problem List   Diagnosis Date Noted  . Spinal stenosis, lumbar region, with neurogenic claudication 08/22/2014  . Other constipation 07/31/2014  . Spinal stenosis of lumbar region 02/19/2014  . Morbid obesity ([place]) 10/09/2013  . Routine general medical examination at a health care facility 09/13/2013  . Visit for screening mammogram 09/13/2013  . Hypokalemia 01/27/2012  . DJD (degenerative joint disease) of knee 01/27/2012  . Depression with anxiety 01/27/2012  . OSA (obstructive sleep apnea) 12/24/2011  . Hypertension 01/04/2011  . CAD, NATIVE VESSEL 09/28/2010  . Hyperlipidemia with target LDL less than 70 07/28/2010  . Hyperglycemia 07/28/2010  . Proteinuria  07/28/2010    Past Surgical History:  Procedure Laterality Date  . ABDOMINAL HYSTERECTOMY  2009  . CORONARY ANGIOPLASTY WITH STENT PLACEMENT      OB History    No data available       Home Medications    Prior to Admission medications   Medication Sig Start Date End Date Taking? Authorizing Provider  acetaminophen (TYLENOL) 500 MG tablet Take 1,000 mg by mouth every 12 (twelve) hours as needed for moderate pain.   Yes [provider]  aspirin 81 MG EC tablet Take 162 mg by mouth daily.    Yes [provider]  atorvastatin (LIPITOR) 80 MG tablet TAKE 1 TABLET (80 MG TOTAL) BY MOUTH DAILY. 04/25/16  Yes Janith Lima, MD  ibuprofen (ADVIL,MOTRIN) 200 MG tablet Take 800 mg by mouth every 12 (twelve) hours as needed for moderate pain.   Yes [provider]  losartan-hydrochlorothiazide (HYZAAR) 100-25 MG tablet TAKE 1 TABLET BY MOUTH DAILY. 07/15/16  Yes Janith Lima, MD  metoprolol succinate (TOPROL-XL) 25 MG 24 hr tablet TAKE 1 TABLET BY MOUTH EVERY DAY Patient taking differently: TAKE 25 MG BY MOUth in the evening 07/14/14  Yes Janith Lima, MD  Omega-3 Fatty Acids (FISH OIL PO) Take 3 capsules by mouth daily.    Yes [provider]  senna-docusate (SENOKOT-S) 8.6-50 MG tablet Take 1 tablet by mouth daily. Patient taking differently: Take 1 tablet by mouth at bedtime as  needed.  04/24/16  Yes Pisciotta, Elmyra Ricks, PA-C  HYDROcodone-acetaminophen (NORCO/VICODIN) 5-325 MG tablet Take 1-2 tablets by mouth every 6 hours as needed for pain. Patient not taking: Reported on 06/23/2017 04/26/16   Janith Lima, MD  lidocaine (LMX) 4 % cream Apply 1 application topically 3 (three) times daily as needed. Patient not taking: Reported on 06/23/2017 04/24/16   Pisciotta, Elmyra Ricks, PA-C  metoprolol succinate (TOPROL-XL) 25 MG 24 hr tablet TAKE 1 TABLET BY MOUTH EVERY DAY Patient not taking: Reported on 06/23/2017 07/15/16   Janith Lima, MD    Family  History Family History  Problem Relation Age of Onset  . Heart attack Father 23       MI  . Heart disease Father   . Hypertension Mother   . Hyperlipidemia Sister   . Hypertension Brother   . Hypertension Brother   . Hypertension Other   . Hyperlipidemia Other   . Heart attack Cousin        5 first cousins with MI's in their 93s  . Diabetes Daughter   . Coronary artery disease Neg Hx   . Stroke Neg Hx   . Alcohol abuse Neg Hx   . Cancer Neg Hx   . Early death Neg Hx     Social History Social History   Tobacco Use  . Smoking status: Never Smoker  . Smokeless tobacco: Never Used  Substance Use Topics  . Alcohol use: Yes    Comment: occasional  . Drug use: No     Allergies   Lisinopril   Review of Systems Review of Systems  All other systems negative except as documented in the HPI. All pertinent positives and negatives as reviewed in the HPI. Physical Exam Updated Vital Signs BP (!) 181/61   Pulse 80   Temp 98 F (36.7 C) (Oral)   Resp 20   Ht 5\' 7"  (1.702 m)   Wt (!) 156.5 kg (345 lb)   LMP 02/10/2008   SpO2 100%   BMI 54.03 kg/m   Physical Exam  Constitutional: She is oriented to person, place, and time. She appears well-developed and well-nourished. No distress.  HENT:  Head: Normocephalic and atraumatic.  Mouth/Throat: Oropharynx is clear and moist.  Eyes: Pupils are equal, round, and reactive to light.  Neck: Normal range of motion. Neck supple.  Cardiovascular: Normal rate, regular rhythm and normal heart sounds. Exam reveals no gallop and no friction rub.  No murmur heard. Pulmonary/Chest: Effort normal and breath sounds normal. No respiratory distress. She has no wheezes.  Abdominal: Soft. Bowel sounds are normal. She exhibits no distension. There is no tenderness.  Musculoskeletal:       Back:  Neurological: She is alert and oriented to person, place, and time. She exhibits normal muscle tone. Coordination normal.  Reflex Scores:       Patellar reflexes are 2+ on the right side and 2+ on the left side. The patient appears to have pain related issues with neurological testing.  She states that she does not feel she can stand up due to the pain.  Patient states that she has been able to walk but very delicately due to the pain as the patient does have what appears to be normal strength in her feet  and lower extremities.   Skin: Skin is warm and dry. Capillary refill takes less than 2 seconds. No rash noted. No erythema.  Psychiatric: She has a normal mood and affect. Her behavior is normal.  Nursing note and vitals reviewed.    ED Treatments / Results  Labs (all labs ordered are listed, but only abnormal results are displayed) Labs Reviewed  URINALYSIS, ROUTINE W REFLEX MICROSCOPIC - Abnormal; Notable for the following components:      Result Value   Color, Urine AMBER (*)    APPearance HAZY (*)    Specific Gravity, Urine 1.031 (*)    Leukocytes, UA LARGE (*)    Bacteria, UA RARE (*)    Squamous Epithelial / LPF 6-30 (*)    All other components within normal limits  CBC WITH DIFFERENTIAL/PLATELET  BASIC METABOLIC PANEL    EKG  EKG Interpretation None       Radiology No results found.  Procedures Procedures (including critical care time)  Medications Ordered in ED Medications  HYDROmorphone (DILAUDID) injection 1 mg (1 mg Intravenous Given 06/23/17 2233)  diazepam (VALIUM) tablet 5 mg (5 mg Oral Given 06/23/17 2233)  dexamethasone (DECADRON) injection 10 mg (10 mg Intravenous Given 06/24/17 0001)  HYDROmorphone (DILAUDID) injection 1 mg (1 mg Intravenous Given 06/24/17 0001)     Initial Impression / Assessment and Plan / ED Course  I have reviewed the triage vital signs and the nursing notes.  Pertinent labs & imaging results that were available during my care of the patient were reviewed by me and considered in my medical decision making (see chart for details).     Patient will need an MRI to  further evaluate these possible issues of weakness in her lower extremities.  The patient does have normal reflexes.  Patient is advised of the plan and she is given pain control here in the emergency department.  Patient does report some relief of her symptoms following pain control.  Patient will be signed out to Domenic Moras PA-C  Final Clinical Impressions(s) / ED Diagnoses   Final diagnoses:  None    ED Discharge Orders    None       Dalia Heading, PA-C 06/24/17 [place], Ankit, MD 06/24/17 1501

## 2017-06-24 NOTE — ED Provider Notes (Signed)
Received sign out from [employer], PA-C.  Pt is morbidly obese, with hx of chronic back pain here with worsening back pain.  She was having difficulty standing up or move about 2/2 to pain.  She is NVI.  Labs are remarkable for hypokalemia with K+ 3.1, supplementation given.  MRI of Lspine showing no evidence of acute abnormality or evidence of caudal equina.  Pt does have chronic degenerative changes with evidence of disc bulging which certainly can account for her pain.  Since pt does not have a pain specialist or a spine specialist, I will provide referral.  Furthermore, will treat her condition with NSAIDs, steroid, and muscle relaxant.  Return precaution given.  Pt voice understanding and agrees with plan.    BP (!) 153/62   Pulse 66   Temp 98 F (36.7 C) (Oral)   Resp 20   Ht 5\' 7"  (1.702 m)   Wt (!) 156.5 kg (345 lb)   LMP 02/10/2008   SpO2 97%   BMI 54.03 kg/m   Results for orders placed or performed during the hospital encounter of 85/27/78  Basic metabolic panel  Result Value Ref Range   Sodium 141 135 - 145 mmol/L   Potassium 3.1 (L) 3.5 - 5.1 mmol/L   Chloride 107 101 - 111 mmol/L   CO2 25 22 - 32 mmol/L   Glucose, Bld 100 (H) 65 - 99 mg/dL   BUN 11 6 - 20 mg/dL   Creatinine, Ser 0.80 0.44 - 1.00 mg/dL   Calcium 9.3 8.9 - 10.3 mg/dL   GFR calc non Af Amer >60 >60 mL/min   GFR calc Af Amer >60 >60 mL/min   Anion gap 9 5 - 15  CBC with Differential  Result Value Ref Range   WBC 8.3 4.0 - 10.5 K/uL   RBC 4.46 3.87 - 5.11 MIL/uL   Hemoglobin 13.5 12.0 - 15.0 g/dL   HCT 40.1 36.0 - 46.0 %   MCV 89.9 78.0 - 100.0 fL   MCH 30.3 26.0 - 34.0 pg   MCHC 33.7 30.0 - 36.0 g/dL   RDW 13.7 11.5 - 15.5 %   Platelets 235 150 - 400 K/uL   Neutrophils Relative % 60 %   Neutro Abs 5.0 1.7 - 7.7 K/uL   Lymphocytes Relative 31 %   Lymphs Abs 2.6 0.7 - 4.0 K/uL   Monocytes Relative 8 %   Monocytes Absolute 0.6 0.1 - 1.0 K/uL   Eosinophils Relative 1 %   Eosinophils Absolute 0.1  0.0 - 0.7 K/uL   Basophils Relative 0 %   Basophils Absolute 0.0 0.0 - 0.1 K/uL  Urinalysis, Routine w reflex microscopic  Result Value Ref Range   Color, Urine AMBER (A) YELLOW   APPearance HAZY (A) CLEAR   Specific Gravity, Urine 1.031 (H) 1.005 - 1.030   pH 5.0 5.0 - 8.0   Glucose, UA NEGATIVE NEGATIVE mg/dL   Hgb urine dipstick NEGATIVE NEGATIVE   Bilirubin Urine NEGATIVE NEGATIVE   Ketones, ur NEGATIVE NEGATIVE mg/dL   Protein, ur NEGATIVE NEGATIVE mg/dL   Nitrite NEGATIVE NEGATIVE   Leukocytes, UA LARGE (A) NEGATIVE   RBC / HPF 0-5 0 - 5 RBC/hpf   WBC, UA 6-30 0 - 5 WBC/hpf   Bacteria, UA RARE (A) NONE SEEN   Squamous Epithelial / LPF 6-30 (A) NONE SEEN   Mucus PRESENT    Mr Lumbar Spine Wo Contrast  Result Date: 06/24/2017 CLINICAL DATA:  Initial evaluation for acute on  chronic back pain. EXAM: MRI LUMBAR SPINE WITHOUT CONTRAST TECHNIQUE: Multiplanar, multisequence MR imaging of the lumbar spine was performed. No intravenous contrast was administered. COMPARISON:  Prior MRI from 04/24/2016. FINDINGS: Segmentation: Normal segmentation. Lowest well-formed disc labeled the L5-S1 level. Alignment: 5 mm anterolisthesis of L4 on L5. Straightening of the normal lumbar lordosis. Vertebrae: Vertebral body heights maintained without evidence for acute or chronic fracture. Chronic reactive endplate changes present about the L4-5 and L5-S1 interspaces. Diffusely decreased T1 weighted signal seen throughout the visualized bone marrow, suspected to be related to underlying obesity. No discrete or worrisome osseous lesions. No abnormal marrow edema. Conus medullaris and cauda equina: Conus extends to the L2 level. Conus and cauda equina appear normal. Paraspinal and other soft tissues: Paraspinous soft tissues demonstrate no acute abnormality. T2 hyperintense simple cyst noted within the kidneys bilaterally. Visualized visceral structures otherwise unremarkable. Disc levels: Diffuse congenital  narrowing of the pedicles noted with resultant congenital spinal stenosis. T11-12: Seen only on sagittal projection. Diffuse disc bulge with disc desiccation. Resultant mild spinal stenosis. Mild to moderate bilateral foraminal narrowing. T12-L1:  Unremarkable. L1-2:  Unremarkable. L2-3:  Unremarkable. L3-4: Diffuse disc bulge with disc desiccation. Superimposed broad left extraforaminal/ far lateral disc protrusion again noted, similar to previous. Mild facet ligament flavum hypertrophy. Stable moderate left subarticular and foraminal stenosis. L4-5: Anterolisthesis. Associated broad posterior disc bulge. Advanced facet arthropathy with ligamentum flavum hypertrophy. Bilateral joint effusions. Changes superimposed on short pedicles result in severe spinal stenosis. Thecal sac measures 6 mm in AP diameter, similar to previous. Moderate bilateral L4 foraminal narrowing related to slippage, disc bulge, and facet disease. L5-S1: Chronic intervertebral disc space narrowing with diffuse disc bulge and reactive endplate changes. Moderate right with mild left facet arthrosis. Moderate bilateral subarticular stenosis due to disc osteophyte and facet disease, stable. Moderate bilateral L5 foraminal narrowing also relatively similar. IMPRESSION: 1. Overall little interval change in appearance of the lumbar spine as compared to 04/24/16. No acute abnormality. 2. 5 mm anterolisthesis of L4 on L5 with associated disc bulge and advanced facet arthropathy at L4-5 with resultant severe canal and moderate bilateral L4 foraminal stenosis. 3. 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 4. Disc bulging with superimposed broad left extraforaminal disc protrusion at L3-4 with resultant moderate left subarticular and foraminal stenosis. 5. Underlying congenital spinal stenosis. Electronically Signed   By: Jeannine Boga M.D.   On: 06/24/2017  03:34      Domenic Moras, PA-C 06/24/17 [place], Donald, MD 06/24/17 [phone]

## 2017-06-24 NOTE — ED Notes (Signed)
Patient transported to MRI 

## 2017-07-19 ENCOUNTER — Telehealth: Payer: Self-pay | Admitting: Cardiology

## 2017-07-19 NOTE — Telephone Encounter (Signed)
New message     Patient calling regarding Repatha she received today. Calling for instruction on use. Please call

## 2017-07-19 NOTE — Telephone Encounter (Signed)
Patient is familiar with injection technique.  Encouraged her to go ahead with dose at home.  If any concerns afterward to call.  Patient voiced understanding.

## 2017-08-15 ENCOUNTER — Other Ambulatory Visit: Payer: Self-pay | Admitting: Pharmacist Clinician (PhC)/ Clinical Pharmacy Specialist

## 2017-08-15 ENCOUNTER — Telehealth: Payer: Self-pay | Admitting: Pharmacist Clinician (PhC)/ Clinical Pharmacy Specialist

## 2017-08-15 DIAGNOSIS — E785 Hyperlipidemia, unspecified: Secondary | ICD-10-CM

## 2017-08-15 NOTE — Telephone Encounter (Signed)
Patient on [employer], took first dose on Jan 9.  No concerns or side effects.   Knows to get labs drawn after 3rd or 4th dose.  Will put order in Epic for patient to come to office lab

## 2017-09-06 LAB — LIPID PANEL
CHOL/HDL RATIO: 6.1 ratio — AB (ref 0.0–4.4)
CHOLESTEROL TOTAL: 171 mg/dL (ref 100–199)
HDL: 28 mg/dL — ABNORMAL LOW (ref >39)
LDL Calculated: 126 mg/dL — ABNORMAL HIGH (ref 0–99)
TRIGLYCERIDES: 83 mg/dL (ref 0–149)
VLDL Cholesterol Cal: 17 mg/dL (ref 5–40)

## 2017-09-07 ENCOUNTER — Other Ambulatory Visit: Payer: Self-pay | Admitting: Internal Medicine

## 2017-09-07 ENCOUNTER — Telehealth: Payer: Self-pay | Admitting: Pharmacist Clinician (PhC)/ Clinical Pharmacy Specialist

## 2017-09-07 DIAGNOSIS — E785 Hyperlipidemia, unspecified: Secondary | ICD-10-CM

## 2017-09-07 DIAGNOSIS — I2510 Atherosclerotic heart disease of native coronary artery without angina pectoris: Secondary | ICD-10-CM

## 2017-09-07 NOTE — Telephone Encounter (Signed)
Labs ordered to repeat lipid/hepatic in 2 months.  Patient has been on Repatha x 2 months, but had to stop atorvastatin for several weeks prior to surgical procedure.

## 2017-09-08 ENCOUNTER — Telehealth: Payer: Self-pay | Admitting: Internal Medicine

## 2017-09-08 MED ORDER — METOPROLOL SUCCINATE ER 25 MG PO TB24: 25.0000 mg | ORAL_TABLET | Freq: Every day | ORAL | 0 refills | Status: DC

## 2017-09-08 MED ORDER — LOSARTAN POTASSIUM-HCTZ 100-25 MG PO TABS: 1.0000 | ORAL_TABLET | Freq: Every day | ORAL | 0 refills | Status: DC

## 2017-09-08 NOTE — Telephone Encounter (Signed)
Hyzaar refill Last OV: 07/15/16 Last Refill:07/25/16 Pharmacy:[facility] [address]  Toprol refill Last OV: 07/15/16 Last Refill:07/15/16 Pharmacy:[facility] [address]  pt has upcoming appt 09/20/17   Filling for 15 days each

## 2017-09-08 NOTE — Telephone Encounter (Signed)
Copied from [place]. Topic: Quick Communication - See Telephone Encounter >> Sep 08, 2017  8:28 AM Burnis Medin, NT wrote: CRM for notification. See Telephone encounter for: Patient called and said that her refill request was denied for losartan-hydrochlorothiazide (HYZAAR) 100-25 MG tablet and metoprolol succinate (TOPROL-XL) 25 MG 24 hr tablet. Pt said pharmacy said she needed an appointment and one has been scheduled for 3/12. Patient asked if at least a 10 day supply  be [facility] to [id number] [address], [place] [phone] (Phone) [phone] (Fax)    09/08/17.

## 2017-09-20 ENCOUNTER — Other Ambulatory Visit (INDEPENDENT_AMBULATORY_CARE_PROVIDER_SITE_OTHER): Payer: Self-pay

## 2017-09-20 ENCOUNTER — Encounter: Payer: Self-pay | Admitting: Internal Medicine

## 2017-09-20 ENCOUNTER — Ambulatory Visit (INDEPENDENT_AMBULATORY_CARE_PROVIDER_SITE_OTHER): Payer: Self-pay | Admitting: Internal Medicine

## 2017-09-20 VITALS — BP 146/84 | HR 93 | Temp 98.0°F | Resp 16 | Ht 67.0 in | Wt 297.1 lb

## 2017-09-20 DIAGNOSIS — E876 Hypokalemia: Secondary | ICD-10-CM

## 2017-09-20 DIAGNOSIS — E785 Hyperlipidemia, unspecified: Secondary | ICD-10-CM

## 2017-09-20 DIAGNOSIS — R808 Other proteinuria: Secondary | ICD-10-CM

## 2017-09-20 DIAGNOSIS — I10 Essential (primary) hypertension: Secondary | ICD-10-CM

## 2017-09-20 DIAGNOSIS — R7303 Prediabetes: Secondary | ICD-10-CM

## 2017-09-20 DIAGNOSIS — I2510 Atherosclerotic heart disease of native coronary artery without angina pectoris: Secondary | ICD-10-CM

## 2017-09-20 DIAGNOSIS — Z124 Encounter for screening for malignant neoplasm of cervix: Secondary | ICD-10-CM | POA: Insufficient documentation

## 2017-09-20 DIAGNOSIS — Z1231 Encounter for screening mammogram for malignant neoplasm of breast: Secondary | ICD-10-CM

## 2017-09-20 DIAGNOSIS — Z1211 Encounter for screening for malignant neoplasm of colon: Secondary | ICD-10-CM

## 2017-09-20 LAB — COMPREHENSIVE METABOLIC PANEL
ALT: 19 U/L (ref 0–35)
AST: 17 U/L (ref 0–37)
Albumin: 4.1 g/dL (ref 3.5–5.2)
Alkaline Phosphatase: 52 U/L (ref 39–117)
BUN: 13 mg/dL (ref 6–23)
CHLORIDE: 104 meq/L (ref 96–112)
CO2: 29 meq/L (ref 19–32)
CREATININE: 0.77 mg/dL (ref 0.40–1.20)
Calcium: 10.2 mg/dL (ref 8.4–10.5)
GFR: 101.81 mL/min (ref >60.00)
GLUCOSE: 86 mg/dL (ref 70–99)
Potassium: 3.2 mEq/L — ABNORMAL LOW (ref 3.5–5.1)
Sodium: 143 mEq/L (ref 135–145)
Total Bilirubin: 0.8 mg/dL (ref 0.2–1.2)
Total Protein: 7.2 g/dL (ref 6.0–8.3)

## 2017-09-20 LAB — MAGNESIUM: Magnesium: 1.8 mg/dL (ref 1.5–2.5)

## 2017-09-20 LAB — HEMOGLOBIN A1C: HEMOGLOBIN A1C: 5.9 % (ref 4.6–6.5)

## 2017-09-20 MED ORDER — LOSARTAN POTASSIUM 100 MG PO TABS: 100.0000 mg | ORAL_TABLET | Freq: Every day | ORAL | 0 refills | Status: DC

## 2017-09-20 MED ORDER — SPIRONOLACTONE 25 MG PO TABS: 25.0000 mg | ORAL_TABLET | Freq: Every day | ORAL | 0 refills | Status: DC

## 2017-09-20 MED ORDER — ATORVASTATIN CALCIUM 80 MG PO TABS: ORAL_TABLET | ORAL | 1 refills | Status: DC

## 2017-09-20 NOTE — Progress Notes (Signed)
Subjective:  Patient ID: Jacqueline Ferrell, female    DOB: [birth year]  Age: 52 y.o. MRN: [id number]  CC: Hypertension and Hyperlipidemia   HPI Jacqueline Ferrell presents for f/up -she complains of chronic, radiating, low back pain with paresthesias in her lower extremities.  She tells me she is not willing to have surgery yet.  She is controlling the pain with Tylenol.  She tells me her blood pressure has been well controlled recently but she continues to struggle with hypokalemia.  She tells me she recently started a PC SK 9 inhibitor but it is not lowered her LDL enough and she wants to restart the statin.  Outpatient Medications Prior to Visit  Medication Sig Dispense Refill  . acetaminophen (TYLENOL) 500 MG tablet Take 1,000 mg by mouth every 12 (twelve) hours as needed for moderate pain.    Marland Kitchen aspirin 81 MG EC tablet Take 162 mg by mouth daily.     . Evolocumab (REPATHA Fajardo) Inject into the skin.    . metoprolol succinate (TOPROL-XL) 25 MG 24 hr tablet Take 1 tablet (25 mg total) by mouth daily. 30 tablet 0  . Omega-3 Fatty Acids (FISH OIL PO) Take 3 capsules by mouth daily.     Marland Kitchen atorvastatin (LIPITOR) 80 MG tablet TAKE 1 TABLET (80 MG TOTAL) BY MOUTH DAILY. 90 tablet 3  . cyclobenzaprine (FLEXERIL) 10 MG tablet Take 1 tablet (10 mg total) by mouth 2 (two) times daily as needed for muscle spasms. 20 tablet 0  . HYDROcodone-acetaminophen (NORCO/VICODIN) 5-325 MG tablet Take 1-2 tablets by mouth every 6 hours as needed for pain. 90 tablet 0  . ibuprofen (ADVIL,MOTRIN) 200 MG tablet Take 4 tablets (800 mg total) by mouth every 12 (twelve) hours as needed for moderate pain. 30 tablet 0  . lidocaine (LMX) 4 % cream Apply 1 application topically 3 (three) times daily as needed. 133 g 0  . losartan-hydrochlorothiazide (HYZAAR) 100-25 MG tablet Take 1 tablet by mouth daily. 30 tablet 0  . metoprolol succinate (TOPROL-XL) 25 MG 24 hr tablet TAKE 1 TABLET BY MOUTH EVERY DAY (Patient taking  differently: TAKE 25 MG BY MOUth in the evening) 90 tablet 1  . predniSONE (DELTASONE) 20 MG tablet 3 tabs po day one, then 2 tabs daily x 4 days 11 tablet 0  . senna-docusate (SENOKOT-S) 8.6-50 MG tablet Take 1 tablet by mouth daily. (Patient taking differently: Take 1 tablet by mouth at bedtime as needed. ) 30 tablet 0   No facility-administered medications prior to visit.     ROS Review of Systems  Constitutional: Negative for chills, diaphoresis, fatigue and fever.  HENT: Negative.  Negative for sinus pressure and trouble swallowing.   Eyes: Negative for visual disturbance.  Respiratory: Negative for cough, chest tightness, shortness of breath and wheezing.   Cardiovascular: Negative for chest pain, palpitations and leg swelling.  Gastrointestinal: Negative for abdominal pain, constipation, diarrhea, nausea and vomiting.  Endocrine: Negative.   Genitourinary: Negative.  Negative for difficulty urinating.  Musculoskeletal: Positive for back pain. Negative for myalgias and neck pain.  Skin: Negative.  Negative for color change and pallor.  Allergic/Immunologic: Negative.   Neurological: Positive for numbness. Negative for dizziness, syncope, weakness, light-headedness and headaches.  Hematological: Negative for adenopathy. Does not bruise/bleed easily.  Psychiatric/Behavioral: Negative.     Objective:  BP (!) 146/84 (BP Location: Left Arm, Patient Position: Sitting, Cuff Size: Large)   Pulse 93   Temp 98 F (36.7 C) (Oral)  Resp 16   Ht 5\' 7"  (1.702 m)   Wt 297 lb 1.3 oz (134.8 kg)   LMP 02/10/2008   SpO2 98%   BMI 46.53 kg/m   BP Readings from Last 3 Encounters:  09/20/17 (!) 146/84  06/24/17 (!) 168/106  06/22/17 (!) 205/113    Wt Readings from Last 3 Encounters:  09/20/17 297 lb 1.3 oz (134.8 kg)  06/23/17 (!) 345 lb (156.5 kg)  05/19/17 (!) 357 lb (161.9 kg)    Physical Exam  Constitutional: She is oriented to person, place, and time. No distress.  HENT:    Mouth/Throat: Oropharynx is clear and moist. No oropharyngeal exudate.  Eyes: Conjunctivae are normal. Left eye exhibits no discharge. No scleral icterus.  Neck: Normal range of motion. Neck supple. No tracheal deviation present. No thyromegaly present.  Cardiovascular: Normal rate, regular rhythm and normal heart sounds. Exam reveals no gallop and no friction rub.  No murmur heard. Pulmonary/Chest: Effort normal and breath sounds normal. No respiratory distress. She has no wheezes. She has no rales.  Abdominal: Soft. Bowel sounds are normal. She exhibits no distension and no mass. There is no tenderness. There is no guarding.  Musculoskeletal: Normal range of motion. She exhibits no edema, tenderness or deformity.  Lymphadenopathy:    She has no cervical adenopathy.  Neurological: She is alert and oriented to person, place, and time.  Skin: Skin is warm and dry. No rash noted. She is not diaphoretic. No erythema. No pallor.  Vitals reviewed.   Lab Results  Component Value Date   WBC 8.3 06/23/2017   HGB 13.5 06/23/2017   HCT 40.1 06/23/2017   PLT 235 06/23/2017   GLUCOSE 86 09/20/2017   CHOL 171 09/06/2017   TRIG 83 09/06/2017   HDL 28 (L) 09/06/2017   LDLCALC 126 (H) 09/06/2017   ALT 19 09/20/2017   AST 17 09/20/2017   NA 143 09/20/2017   K 3.2 (L) 09/20/2017   CL 104 09/20/2017   CREATININE 0.77 09/20/2017   BUN 13 09/20/2017   CO2 29 09/20/2017   TSH 2.67 06/26/2015   INR 1.1 (H) 09/11/2010   HGBA1C 5.9 09/20/2017    Mr Lumbar Spine Wo Contrast  Result Date: 06/24/2017 CLINICAL DATA:  Initial evaluation for acute on chronic back pain. EXAM: MRI LUMBAR SPINE WITHOUT CONTRAST TECHNIQUE: Multiplanar, multisequence MR imaging of the lumbar spine was performed. No intravenous contrast was administered. COMPARISON:  Prior MRI from 04/24/2016. FINDINGS: Segmentation: Normal segmentation. Lowest well-formed disc labeled the L5-S1 level. Alignment: 5 mm anterolisthesis of L4 on  L5. Straightening of the normal lumbar lordosis. Vertebrae: Vertebral body heights maintained without evidence for acute or chronic fracture. Chronic reactive endplate changes present about the L4-5 and L5-S1 interspaces. Diffusely decreased T1 weighted signal seen throughout the visualized bone marrow, suspected to be related to underlying obesity. No discrete or worrisome osseous lesions. No abnormal marrow edema. Conus medullaris and cauda equina: Conus extends to the L2 level. Conus and cauda equina appear normal. Paraspinal and other soft tissues: Paraspinous soft tissues demonstrate no acute abnormality. T2 hyperintense simple cyst noted within the kidneys bilaterally. Visualized visceral structures otherwise unremarkable. Disc levels: Diffuse congenital narrowing of the pedicles noted with resultant congenital spinal stenosis. T11-12: Seen only on sagittal projection. Diffuse disc bulge with disc desiccation. Resultant mild spinal stenosis. Mild to moderate bilateral foraminal narrowing. T12-L1:  Unremarkable. L1-2:  Unremarkable. L2-3:  Unremarkable. L3-4: Diffuse disc bulge with disc desiccation. Superimposed broad left extraforaminal/ far lateral  disc protrusion again noted, similar to previous. Mild facet ligament flavum hypertrophy. Stable moderate left subarticular and foraminal stenosis. L4-5: Anterolisthesis. Associated broad posterior disc bulge. Advanced facet arthropathy with ligamentum flavum hypertrophy. Bilateral joint effusions. Changes superimposed on short pedicles result in severe spinal stenosis. Thecal sac measures 6 mm in AP diameter, similar to previous. Moderate bilateral L4 foraminal narrowing related to slippage, disc bulge, and facet disease. L5-S1: Chronic intervertebral disc space narrowing with diffuse disc bulge and reactive endplate changes. Moderate right with mild left facet arthrosis. Moderate bilateral subarticular stenosis due to disc osteophyte and facet disease, stable.  Moderate bilateral L5 foraminal narrowing also relatively similar. IMPRESSION: 1. Overall little interval change in appearance of the lumbar spine as compared to 04/24/16. No acute abnormality. 2. 5 mm anterolisthesis of L4 on L5 with associated disc bulge and advanced facet arthropathy at L4-5 with resultant severe canal and moderate bilateral L4 foraminal stenosis. 3. 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 4. Disc bulging with superimposed broad left extraforaminal disc protrusion at L3-4 with resultant moderate left subarticular and foraminal stenosis. 5. Underlying congenital spinal stenosis. Electronically Signed   By: Jeannine Boga M.D.   On: 06/24/2017 03:34    Assessment & Plan:   Belvie was seen today for hypertension and hyperlipidemia.  Diagnoses and all orders for this visit:  Hypokalemia- Her potassium remains low at 3.2.  I have asked her to upgrade to a potassium sparing diuretic. -     Magnesium; Future -     Comprehensive metabolic panel; Future -     spironolactone (ALDACTONE) 25 MG tablet; Take 1 tablet (25 mg total) by mouth daily.  Prediabetes- Her A1c is at 5.9%.  She is prediabetic.  Medical therapy is not indicated. -     Comprehensive metabolic panel; Future -     Hemoglobin A1c; Future  Other proteinuria  Essential hypertension- Her blood pressure is not quite adequately well controlled and she has persistent hypokalemia.  Will continue the ARB but will also change to a potassium sparing diuretic to stabilize her potassium level. -     Magnesium; Future -     Comprehensive metabolic panel; Future -     spironolactone (ALDACTONE) 25 MG tablet; Take 1 tablet (25 mg total) by mouth daily. -     losartan (COZAAR) 100 MG tablet; Take 1 tablet (100 mg total) by mouth daily.  Visit for screening mammogram -     MM DIGITAL SCREENING BILATERAL; Future  Atherosclerosis  of native coronary artery of native heart without angina pectoris -     atorvastatin (LIPITOR) 80 MG tablet; TAKE 1 TABLET (80 MG TOTAL) BY MOUTH DAILY. -     losartan (COZAAR) 100 MG tablet; Take 1 tablet (100 mg total) by mouth daily.  Hyperlipidemia with target LDL less than 70- She has not achieved her LDL goal with the PCSK 9 inhibitor.  Will start high-dose, high intensity statin since she has CAD. -     atorvastatin (LIPITOR) 80 MG tablet; TAKE 1 TABLET (80 MG TOTAL) BY MOUTH DAILY.  Screening for cervical cancer -     Ambulatory referral to Gynecology   I have discontinued Rainy Rothman. Nigg's senna-docusate, HYDROcodone-acetaminophen, lidocaine, predniSONE, ibuprofen, cyclobenzaprine, and losartan-hydrochlorothiazide. I am also having her start on spironolactone and losartan. Additionally, I am having her maintain her aspirin, Omega-3 Fatty Acids (FISH OIL PO), acetaminophen, metoprolol succinate, Evolocumab (REPATHA Aledo), and atorvastatin.  Meds ordered this encounter  Medications  . atorvastatin (LIPITOR) 80 MG tablet    Sig: TAKE 1 TABLET (80 MG TOTAL) BY MOUTH DAILY.    Dispense:  90 tablet    Refill:  1  . spironolactone (ALDACTONE) 25 MG tablet    Sig: Take 1 tablet (25 mg total) by mouth daily.    Dispense:  90 tablet    Refill:  0  . losartan (COZAAR) 100 MG tablet    Sig: Take 1 tablet (100 mg total) by mouth daily.    Dispense:  90 tablet    Refill:  0     Follow-up: Return in about 6 months (around 03/23/2018).  Scarlette Calico, MD

## 2017-09-20 NOTE — Patient Instructions (Signed)

## 2017-09-21 NOTE — Addendum Note (Signed)
Addended by: Aviva Signs M on: 09/21/2017 11:18 AM   Modules accepted: Orders

## 2017-10-10 ENCOUNTER — Encounter: Payer: Self-pay | Admitting: Internal Medicine

## 2017-10-27 LAB — HM MAMMOGRAPHY

## 2017-10-28 ENCOUNTER — Ambulatory Visit
Admission: RE | Admit: 2017-10-28 | Discharge: 2017-10-28 | Disposition: A | Discharge status: Home / Self Care | Payer: No Typology Code available for payment source | Source: Ambulatory Visit | Attending: Internal Medicine | Admitting: Internal Medicine

## 2017-10-28 DIAGNOSIS — Z1231 Encounter for screening mammogram for malignant neoplasm of breast: Secondary | ICD-10-CM

## 2017-10-31 ENCOUNTER — Other Ambulatory Visit: Payer: Self-pay | Admitting: Internal Medicine

## 2017-10-31 DIAGNOSIS — R928 Other abnormal and inconclusive findings on diagnostic imaging of breast: Secondary | ICD-10-CM

## 2017-11-03 ENCOUNTER — Other Ambulatory Visit: Payer: Self-pay | Admitting: Internal Medicine

## 2017-11-03 ENCOUNTER — Ambulatory Visit
Admission: RE | Admit: 2017-11-03 | Discharge: 2017-11-03 | Disposition: A | Discharge status: Home / Self Care | Payer: No Typology Code available for payment source | Source: Ambulatory Visit | Attending: Internal Medicine | Admitting: Internal Medicine

## 2017-11-03 DIAGNOSIS — R928 Other abnormal and inconclusive findings on diagnostic imaging of breast: Secondary | ICD-10-CM

## 2017-11-03 LAB — HM MAMMOGRAPHY

## 2017-11-07 ENCOUNTER — Ambulatory Visit
Admission: RE | Admit: 2017-11-07 | Discharge: 2017-11-07 | Disposition: A | Discharge status: Home / Self Care | Payer: No Typology Code available for payment source | Source: Ambulatory Visit | Attending: Internal Medicine | Admitting: Internal Medicine

## 2017-11-07 DIAGNOSIS — R928 Other abnormal and inconclusive findings on diagnostic imaging of breast: Secondary | ICD-10-CM

## 2017-12-07 LAB — HEPATIC FUNCTION PANEL
ALBUMIN: 4.2 g/dL (ref 3.5–5.5)
ALT: 10 IU/L (ref 0–32)
AST: 12 IU/L (ref 0–40)
Alkaline Phosphatase: 59 IU/L (ref 39–117)
BILIRUBIN TOTAL: 0.7 mg/dL (ref 0.0–1.2)
Bilirubin, Direct: 0.17 mg/dL (ref 0.00–0.40)
TOTAL PROTEIN: 6.5 g/dL (ref 6.0–8.5)

## 2017-12-07 LAB — LIPID PANEL
Chol/HDL Ratio: 3.1 ratio (ref 0.0–4.4)
Cholesterol, Total: 129 mg/dL (ref 100–199)
HDL: 41 mg/dL (ref >39)
LDL Calculated: 77 mg/dL (ref 0–99)
Triglycerides: 55 mg/dL (ref 0–149)
VLDL Cholesterol Cal: 11 mg/dL (ref 5–40)

## 2017-12-08 ENCOUNTER — Telehealth: Payer: Self-pay | Admitting: Pharmacist Clinician (PhC)/ Clinical Pharmacy Specialist

## 2017-12-08 NOTE — Telephone Encounter (Signed)
LMOM for patient to call back to review lipid labs.  Did she re-start atorvastatin 80 mg?

## 2017-12-12 ENCOUNTER — Other Ambulatory Visit: Payer: Self-pay | Admitting: Internal Medicine

## 2017-12-12 DIAGNOSIS — I10 Essential (primary) hypertension: Secondary | ICD-10-CM

## 2017-12-12 DIAGNOSIS — I2510 Atherosclerotic heart disease of native coronary artery without angina pectoris: Secondary | ICD-10-CM

## 2017-12-12 DIAGNOSIS — E876 Hypokalemia: Secondary | ICD-10-CM

## 2017-12-12 NOTE — Telephone Encounter (Signed)
Labs reviewed with patient.

## 2018-02-22 ENCOUNTER — Encounter: Payer: Self-pay | Admitting: Internal Medicine

## 2018-02-22 ENCOUNTER — Other Ambulatory Visit (INDEPENDENT_AMBULATORY_CARE_PROVIDER_SITE_OTHER): Payer: Self-pay

## 2018-02-22 ENCOUNTER — Ambulatory Visit (INDEPENDENT_AMBULATORY_CARE_PROVIDER_SITE_OTHER): Payer: Self-pay | Admitting: Internal Medicine

## 2018-02-22 VITALS — BP 142/98 | HR 71 | Temp 98.8°F | Resp 16 | Ht 67.0 in | Wt 257.2 lb

## 2018-02-22 DIAGNOSIS — I10 Essential (primary) hypertension: Secondary | ICD-10-CM

## 2018-02-22 DIAGNOSIS — E876 Hypokalemia: Secondary | ICD-10-CM

## 2018-02-22 DIAGNOSIS — E559 Vitamin D deficiency, unspecified: Secondary | ICD-10-CM

## 2018-02-22 LAB — BASIC METABOLIC PANEL
BUN: 15 mg/dL (ref 6–23)
CHLORIDE: 106 meq/L (ref 96–112)
CO2: 26 meq/L (ref 19–32)
CREATININE: 0.68 mg/dL (ref 0.40–1.20)
Calcium: 9.5 mg/dL (ref 8.4–10.5)
GFR: 117.31 mL/min (ref >60.00)
Glucose, Bld: 85 mg/dL (ref 70–99)
POTASSIUM: 3.6 meq/L (ref 3.5–5.1)
SODIUM: 140 meq/L (ref 135–145)

## 2018-02-22 LAB — MAGNESIUM: MAGNESIUM: 1.9 mg/dL (ref 1.5–2.5)

## 2018-02-22 LAB — VITAMIN D 25 HYDROXY (VIT D DEFICIENCY, FRACTURES): VITD: 14.94 ng/mL — AB (ref 30.00–100.00)

## 2018-02-22 MED ORDER — SPIRONOLACTONE 50 MG PO TABS: 50.0000 mg | ORAL_TABLET | Freq: Every day | ORAL | 1 refills | Status: DC

## 2018-02-22 NOTE — Progress Notes (Signed)
Subjective:  Patient ID: Jacqueline Ferrell, female    DOB: 1966-01-18  Age: 51 y.o. MRN: [id number]  CC: Hypertension   HPI Jacqueline Ferrell presents for a BP check - She complains that her blood pressure is not well controlled and she has persistent lower extremity edema.  She tells me her back pain is improving and she is decided not to have surgery at this time.  She is active and denies any recent episodes of CP, DOE, palpitations, or diaphoresis.  Outpatient Medications Prior to Visit  Medication Sig Dispense Refill  . acetaminophen (TYLENOL) 500 MG tablet Take 1,000 mg by mouth every 12 (twelve) hours as needed for moderate pain.    Marland Kitchen aspirin 81 MG EC tablet Take 162 mg by mouth daily.     Marland Kitchen atorvastatin (LIPITOR) 80 MG tablet TAKE 1 TABLET (80 MG TOTAL) BY MOUTH DAILY. 90 tablet 1  . Evolocumab (REPATHA [place]) Inject into the skin.    Marland Kitchen losartan (COZAAR) 100 MG tablet TAKE 1 TABLET BY MOUTH EVERY DAY 90 tablet 0  . metoprolol succinate (TOPROL-XL) 25 MG 24 hr tablet Take 1 tablet (25 mg total) by mouth daily. 30 tablet 0  . spironolactone (ALDACTONE) 25 MG tablet TAKE 1 TABLET BY MOUTH EVERY DAY 90 tablet 0  . Omega-3 Fatty Acids (FISH OIL PO) Take 3 capsules by mouth daily.      No facility-administered medications prior to visit.     ROS Review of Systems  Constitutional: Negative for diaphoresis and fatigue.  HENT: Negative.   Eyes: Negative for visual disturbance.  Respiratory: Negative for cough, chest tightness, shortness of breath and wheezing.   Cardiovascular: Positive for leg swelling. Negative for chest pain and palpitations.  Gastrointestinal: Negative for abdominal pain, constipation, diarrhea, nausea and vomiting.  Genitourinary: Negative.  Negative for difficulty urinating and dysuria.  Musculoskeletal: Positive for back pain. Negative for arthralgias, myalgias and neck pain.  Skin: Negative.   Neurological: Negative for dizziness, light-headedness, numbness and  headaches.  Hematological: Negative for adenopathy. Does not bruise/bleed easily.  Psychiatric/Behavioral: Negative.     Objective:  BP (!) 142/98 (BP Location: Left Arm, Patient Position: Sitting, Cuff Size: Large)   Pulse 71   Temp 98.8 F (37.1 C) (Oral)   Resp 16   Ht 5' 7" (1.702 m)   Wt 257 lb 4 oz (116.7 kg)   LMP 02/10/2008   SpO2 97%   BMI 40.29 kg/m   BP Readings from Last 3 Encounters:  02/22/18 (!) 142/98  09/20/17 (!) 146/84  06/24/17 (!) 168/106    Wt Readings from Last 3 Encounters:  02/22/18 257 lb 4 oz (116.7 kg)  09/20/17 297 lb 1.3 oz (134.8 kg)  06/23/17 (!) 345 lb (156.5 kg)    Physical Exam  Constitutional: She is oriented to person, place, and time. No distress.  HENT:  Mouth/Throat: Oropharynx is clear and moist. No oropharyngeal exudate.  Eyes: Conjunctivae are normal. No scleral icterus.  Neck: Normal range of motion. Neck supple. No JVD present. No thyromegaly present.  Cardiovascular: Normal rate, regular rhythm and normal heart sounds. Exam reveals no gallop and no friction rub.  No murmur heard. Pulmonary/Chest: Effort normal and breath sounds normal. No respiratory distress. She has no wheezes. She has no rhonchi. She has no rales.  Abdominal: Soft. Normal appearance and bowel sounds are normal. She exhibits no mass. There is no hepatosplenomegaly. There is no tenderness.  Musculoskeletal: She exhibits edema (trace edema in BLE).  She exhibits no tenderness or deformity.  Lymphadenopathy:    She has no cervical adenopathy.  Neurological: She is alert and oriented to person, place, and time.  Skin: Skin is warm and dry. She is not diaphoretic. No pallor.  Vitals reviewed.   Lab Results  Component Value Date   WBC 8.3 06/23/2017   HGB 13.5 06/23/2017   HCT 40.1 06/23/2017   PLT 235 06/23/2017   GLUCOSE 85 02/22/2018   CHOL 129 12/07/2017   TRIG 55 12/07/2017   HDL 41 12/07/2017   LDLCALC 77 12/07/2017   ALT 10 12/07/2017   AST 12  12/07/2017   NA 140 02/22/2018   K 3.6 02/22/2018   CL 106 02/22/2018   CREATININE 0.68 02/22/2018   BUN 15 02/22/2018   CO2 26 02/22/2018   TSH 2.67 06/26/2015   INR 1.1 (H) 09/11/2010   HGBA1C 5.9 09/20/2017    Mm Clip Placement Left  Result Date: 11/07/2017 CLINICAL DATA:  Post biopsy mammogram of the left breast for clip placement. EXAM: DIAGNOSTIC LEFT MAMMOGRAM POST ULTRASOUND BIOPSY COMPARISON:  Previous exam(s). FINDINGS: Mammographic images were obtained following ultrasound guided biopsy of mass in the left breast at 2:30. The ribbon shaped biopsy marking clip is well positioned at the site of biopsy in the slightly upper outer quadrant of the left breast. IMPRESSION: Appropriate positioning of the ribbon shaped biopsy marking clip in the upper-outer left breast. Final Assessment: Post Procedure Mammograms for Marker Placement Electronically Signed   By: Ammie Ferrier M.D.   On: 11/07/2017 15:47   [place] Lt Breast Bx W Loc Dev 1st Lesion Img Bx Spec US Guide  Addendum Date: 11/10/2017   ADDENDUM REPORT: 11/09/2017 08:26 ADDENDUM: Pathology revealed FIBROCYSTIC CHANGES, FOCAL INFLAMMATION CONSISTENT WITH CYST RUPTURE, PSEUDOANGIOMATOUS STROMAL HYPERPLASIA, of LEFT breast, 2:30 o'clock . This was found to be concordant by Dr. Ammie Ferrier. Pathology results were discussed with the patient by telephone. The patient reported doing well after the biopsy with tenderness at the site. Post biopsy instructions and care were reviewed and questions were answered. The patient was encouraged to call [facility] for any additional concerns. The patient was instructed to return for annual screening mammography which she states she will have performed at [facility]. She was informed a reminder notice would be sent regarding this appointment. Pathology results reported by Roselind Messier, RN on 11/09/2017. Electronically Signed   By: Ammie Ferrier  M.D.   On: 11/09/2017 08:26   Result Date: 11/10/2017 CLINICAL DATA:  52 year old female presenting for ultrasound-guided biopsy of a left breast mass. EXAM: ULTRASOUND GUIDED LEFT BREAST CORE NEEDLE BIOPSY COMPARISON:  Previous exam(s). FINDINGS: I met with the patient and we discussed the procedure of ultrasound-guided biopsy, including benefits and alternatives. We discussed the high likelihood of a successful procedure. We discussed the risks of the procedure, including infection, bleeding, tissue injury, clip migration, and inadequate sampling. Informed written consent was given. The usual time-out protocol was performed immediately prior to the procedure. Lesion quadrant: Upper-outer quadrant Using sterile technique and 1% Lidocaine as local anesthetic, under direct ultrasound visualization, a 14 gauge spring-loaded device was used to perform biopsy of a mass in the left breast at 2:30 using an inferior approach. Initially, the mass appeared more anechoic than on the prior exam, however had some persistent mural nodularity. Therefore I attempted to aspirate, however the cyst did not entirely collapse. Therefore, I proceeded with biopsy. At the conclusion  of the procedure a ribbon shaped tissue marker clip was deployed into the biopsy cavity. Follow up 2 view mammogram was performed and dictated separately. IMPRESSION: Ultrasound guided biopsy of a left breast mass at 2:30. No apparent complications. Electronically Signed: By: Ammie Ferrier M.D. On: 11/07/2017 15:20    Assessment & Plan:   Elisheva was seen today for hypertension.  Diagnoses and all orders for this visit:  Vitamin D deficiency disease -     VITAMIN D 25 Hydroxy (Vit-D Deficiency, Fractures); Future -     Cholecalciferol 50000 units capsule; Take 1 capsule (50,000 Units total) by mouth once a week.  Hypokalemia- Her potassium level remains in the low normal range at 3.6.  Since her blood pressure is not adequately well controlled  I will double the dose of the spironolactone. -     Basic metabolic panel; Future -     Magnesium; Future -     spironolactone (ALDACTONE) 50 MG tablet; Take 1 tablet (50 mg total) by mouth daily.  Essential hypertension- Her blood pressure is not adequately well controlled.  I will treat the vitamin D deficiency and will double the dose of spironolactone. -     Basic metabolic panel; Future -     Magnesium; Future -     spironolactone (ALDACTONE) 50 MG tablet; Take 1 tablet (50 mg total) by mouth daily.   I have discontinued Jacqueline Ferrell. Ferrell's Omega-3 Fatty Acids (FISH OIL PO) and spironolactone. I am also having her start on spironolactone and Cholecalciferol. Additionally, I am having her maintain her aspirin, acetaminophen, metoprolol succinate, Evolocumab (REPATHA [place]), atorvastatin, and losartan.  Meds ordered this encounter  Medications  . spironolactone (ALDACTONE) 50 MG tablet    Sig: Take 1 tablet (50 mg total) by mouth daily.    Dispense:  90 tablet    Refill:  1  . Cholecalciferol 50000 units capsule    Sig: Take 1 capsule (50,000 Units total) by mouth once a week.    Dispense:  12 capsule    Refill:  1     Follow-up: Return in about 3 months (around 05/25/2018).  Scarlette Calico, MD

## 2018-02-22 NOTE — Patient Instructions (Signed)

## 2018-02-23 ENCOUNTER — Encounter: Payer: Self-pay | Admitting: Internal Medicine

## 2018-02-23 MED ORDER — CHOLECALCIFEROL 1.25 MG (50000 UT) PO CAPS: 50000.0000 [IU] | ORAL_CAPSULE | ORAL | 1 refills | Status: DC

## 2018-03-29 ENCOUNTER — Other Ambulatory Visit: Payer: Self-pay | Admitting: Internal Medicine

## 2018-05-07 ENCOUNTER — Other Ambulatory Visit: Payer: Self-pay | Admitting: Pharmacist Clinician (PhC)/ Clinical Pharmacy Specialist

## 2018-05-07 ENCOUNTER — Telehealth: Payer: Self-pay | Admitting: Pharmacist Clinician (PhC)/ Clinical Pharmacy Specialist

## 2018-05-07 DIAGNOSIS — E785 Hyperlipidemia, unspecified: Secondary | ICD-10-CM

## 2018-05-07 NOTE — Telephone Encounter (Signed)
Mailed new Safety Net application and lab order 

## 2018-05-16 ENCOUNTER — Telehealth: Payer: Self-pay | Admitting: Internal Medicine

## 2018-05-16 NOTE — Telephone Encounter (Signed)
lvm for pt to call back.  

## 2018-05-16 NOTE — Telephone Encounter (Signed)
Copied from [place] [id number]. Topic: General - Other >> May 16, 2018  8:25 AM Carolyn Stare wrote:  Pharmacy call to say pt was given  D2 instead of D3 and they are reaching out to the pt  dCholecalciferol 50000 units capsule

## 2018-05-16 NOTE — Telephone Encounter (Signed)
Pt called back in returning office call. Pt says that she picked up Rx several weeks ago, pt has almost completed taking all of vitamins. Pt would like to make sure that it is okay?   Please advise.

## 2018-05-16 NOTE — Telephone Encounter (Signed)
Pt contacted and informed to contact pharmacy to confirm what she was given. Pt asked to call us back or have the pharmacy to call if she did receive the wrong medication.

## 2018-06-05 ENCOUNTER — Other Ambulatory Visit: Payer: Self-pay | Admitting: Internal Medicine

## 2018-06-05 DIAGNOSIS — E785 Hyperlipidemia, unspecified: Secondary | ICD-10-CM

## 2018-06-05 DIAGNOSIS — I2510 Atherosclerotic heart disease of native coronary artery without angina pectoris: Secondary | ICD-10-CM

## 2018-08-02 ENCOUNTER — Other Ambulatory Visit: Payer: Self-pay | Admitting: Internal Medicine

## 2018-08-02 DIAGNOSIS — E559 Vitamin D deficiency, unspecified: Secondary | ICD-10-CM

## 2018-08-18 ENCOUNTER — Other Ambulatory Visit: Payer: Self-pay | Admitting: Internal Medicine

## 2018-08-18 DIAGNOSIS — I10 Essential (primary) hypertension: Secondary | ICD-10-CM

## 2018-08-18 DIAGNOSIS — E876 Hypokalemia: Secondary | ICD-10-CM

## 2018-08-18 NOTE — Telephone Encounter (Signed)
LVM for patient to call back to make appt, please let office know when appt is made so we can send in a refill.

## 2018-08-18 NOTE — Telephone Encounter (Signed)
Pt was suppose to follow up in November.  If she schedules I will send in some to get her to her appointment.

## 2018-08-18 NOTE — Telephone Encounter (Signed)
LVM

## 2019-01-10 ENCOUNTER — Other Ambulatory Visit: Payer: Self-pay

## 2019-01-10 ENCOUNTER — Encounter: Payer: Self-pay | Admitting: Internal Medicine

## 2019-01-10 ENCOUNTER — Ambulatory Visit (INDEPENDENT_AMBULATORY_CARE_PROVIDER_SITE_OTHER): Payer: Self-pay | Admitting: Internal Medicine

## 2019-01-10 VITALS — BP 164/110 | HR 85 | Temp 98.2°F | Resp 16 | Ht 67.0 in | Wt 257.0 lb

## 2019-01-10 DIAGNOSIS — E785 Hyperlipidemia, unspecified: Secondary | ICD-10-CM

## 2019-01-10 DIAGNOSIS — R808 Other proteinuria: Secondary | ICD-10-CM

## 2019-01-10 DIAGNOSIS — Z0000 Encounter for general adult medical examination without abnormal findings: Secondary | ICD-10-CM

## 2019-01-10 DIAGNOSIS — E559 Vitamin D deficiency, unspecified: Secondary | ICD-10-CM

## 2019-01-10 DIAGNOSIS — I2510 Atherosclerotic heart disease of native coronary artery without angina pectoris: Secondary | ICD-10-CM

## 2019-01-10 DIAGNOSIS — R7303 Prediabetes: Secondary | ICD-10-CM

## 2019-01-10 DIAGNOSIS — I10 Essential (primary) hypertension: Secondary | ICD-10-CM

## 2019-01-10 MED ORDER — NEBIVOLOL HCL 5 MG PO TABS: 5.0000 mg | ORAL_TABLET | Freq: Every day | ORAL | 0 refills | Status: DC

## 2019-01-10 MED ORDER — TRIAMTERENE-HCTZ 37.5-25 MG PO CAPS: 1.0000 | ORAL_CAPSULE | Freq: Every day | ORAL | 0 refills | Status: DC

## 2019-01-10 MED ORDER — ASPIRIN 81 MG PO TBEC: 81.0000 mg | DELAYED_RELEASE_TABLET | Freq: Every day | ORAL | 1 refills | Status: DC

## 2019-01-10 MED ORDER — VITAMIN D3 1.25 MG (50000 UT) PO CAPS: 1.0000 | ORAL_CAPSULE | ORAL | 0 refills | Status: DC

## 2019-01-10 MED ORDER — ATORVASTATIN CALCIUM 80 MG PO TABS: ORAL_TABLET | ORAL | 1 refills | Status: DC

## 2019-01-10 NOTE — Patient Instructions (Signed)

## 2019-01-10 NOTE — Progress Notes (Signed)
Subjective:  Patient ID: Jacqueline Ferrell, female    DOB: 1966-01-11  Age: 53 y.o. MRN: [id number]  CC: Hypertension, Hyperlipidemia, and Coronary Artery Disease   HPI YOHANNA TOW presents for f/up - She is not currently taking any meds because she ran out months ago.  She tells me her blood pressure has not been well controlled.  She has mild unchanged lower extremity edema but she denies headache, blurred vision, chest pain, shortness of breath, or dyspnea on exertion.  She has been working on her lifestyle modifications.  She currently works as a Advertising copywriter.  Her employer does not offer insurance.  She is currently uninsured and does not have the money to undergo any health maintenance procedures such as colonoscopy, mammogram, or Pap smear.  Outpatient Medications Prior to Visit  Medication Sig Dispense Refill   acetaminophen (TYLENOL) 500 MG tablet Take 1,000 mg by mouth every 12 (twelve) hours as needed for moderate pain.     aspirin 81 MG EC tablet Take 162 mg by mouth daily.     atorvastatin (LIPITOR) 80 MG tablet TAKE 1 TABLET BY MOUTH EVERY DAY 90 tablet 1   Cholecalciferol (VITAMIN D3) 1.25 MG (50000 UT) CAPS TAKE 1 CAPSULE BY MOUTH ONCE A WEEK 12 capsule 0   Evolocumab (REPATHA [place]) Inject into the skin.     losartan (COZAAR) 100 MG tablet TAKE 1 TABLET BY MOUTH EVERY DAY 90 tablet 0   metoprolol succinate (TOPROL-XL) 25 MG 24 hr tablet Take 1 tablet (25 mg total) by mouth daily. 30 tablet 0   spironolactone (ALDACTONE) 50 MG tablet Take 1 tablet (50 mg total) by mouth daily. 90 tablet 1   No facility-administered medications prior to visit.     ROS Review of Systems  Constitutional: Negative.  Negative for diaphoresis, fatigue and unexpected weight change.  HENT: Negative.   Eyes: Negative for visual disturbance.  Respiratory: Negative for cough, chest tightness, shortness of breath and wheezing.   Cardiovascular: Positive for leg swelling. Negative for  chest pain and palpitations.  Gastrointestinal: Negative for abdominal pain, constipation, diarrhea and vomiting.  Endocrine: Negative.  Negative for cold intolerance and heat intolerance.  Genitourinary: Negative.  Negative for difficulty urinating.  Musculoskeletal: Positive for arthralgias. Negative for myalgias.  Skin: Negative.  Negative for color change.  Neurological: Negative.  Negative for weakness, light-headedness and numbness.  Hematological: Negative for adenopathy. Does not bruise/bleed easily.  Psychiatric/Behavioral: Negative.  Negative for dysphoric mood and suicidal ideas. The patient is not nervous/anxious.     Objective:  BP (!) 164/110 (BP Location: Left Arm, Patient Position: Sitting, Cuff Size: Large)    Pulse 85    Temp 98.2 F (36.8 C) (Oral)    Resp 16    Ht 5' 7"  (1.702 m)    Wt 257 lb (116.6 kg)    LMP 02/10/2008    SpO2 97%    BMI 40.25 kg/m   BP Readings from Last 3 Encounters:  01/10/19 (!) 164/110  02/22/18 (!) 142/98  09/20/17 (!) 146/84    Wt Readings from Last 3 Encounters:  01/10/19 257 lb (116.6 kg)  02/22/18 257 lb 4 oz (116.7 kg)  09/20/17 297 lb 1.3 oz (134.8 kg)    Physical Exam Vitals signs reviewed.  Constitutional:      Appearance: She is obese. She is not ill-appearing or diaphoretic.  HENT:     Nose: Nose normal.     Mouth/Throat:     Mouth:  Mucous membranes are moist.     Pharynx: No posterior oropharyngeal erythema.  Eyes:     General: No scleral icterus.    Conjunctiva/sclera: Conjunctivae normal.  Neck:     Musculoskeletal: Normal range of motion. No neck rigidity or muscular tenderness.  Cardiovascular:     Rate and Rhythm: Normal rate and regular rhythm.     Heart sounds: No murmur.     Comments: EKG ---  Sinus  Rhythm  WITHIN NORMAL LIMITS  There is 1+ pitting edema isolated to the ankles and feet.   Pulmonary:     Effort: Pulmonary effort is normal.     Breath sounds: No stridor. No wheezing, rhonchi or  rales.  Abdominal:     General: Abdomen is protuberant. Bowel sounds are normal. There is no distension.     Palpations: There is no hepatomegaly or splenomegaly.     Tenderness: There is no abdominal tenderness.     Hernia: No hernia is present.  Musculoskeletal: Normal range of motion.     Right lower leg: 1+ Edema present.     Left lower leg: 1+ Edema present.  Lymphadenopathy:     Cervical: No cervical adenopathy.  Skin:    General: Skin is warm.     Coloration: Skin is not jaundiced or pale.  Neurological:     General: No focal deficit present.     Mental Status: She is oriented to person, place, and time. Mental status is at baseline.  Psychiatric:        Mood and Affect: Mood normal.        Behavior: Behavior normal.     Lab Results  Component Value Date   WBC 6.4 01/11/2019   HGB 15.2 (H) 01/11/2019   HCT 44.9 01/11/2019   PLT 209.0 01/11/2019   GLUCOSE 87 01/11/2019   CHOL 219 (H) 01/11/2019   TRIG 45.0 01/11/2019   HDL 56.60 01/11/2019   LDLCALC 153 (H) 01/11/2019   ALT 14 01/11/2019   AST 14 01/11/2019   NA 138 01/11/2019   K 4.0 01/11/2019   CL 105 01/11/2019   CREATININE 0.75 01/11/2019   BUN 16 01/11/2019   CO2 25 01/11/2019   TSH 1.32 01/11/2019   INR 1.1 (H) 09/11/2010   HGBA1C 5.5 01/11/2019    Mm Clip Placement Left  Result Date: 11/07/2017 CLINICAL DATA:  Post biopsy mammogram of the left breast for clip placement. EXAM: DIAGNOSTIC LEFT MAMMOGRAM POST ULTRASOUND BIOPSY COMPARISON:  Previous exam(s). FINDINGS: Mammographic images were obtained following ultrasound guided biopsy of mass in the left breast at 2:30. The ribbon shaped biopsy marking clip is well positioned at the site of biopsy in the slightly upper outer quadrant of the left breast. IMPRESSION: Appropriate positioning of the ribbon shaped biopsy marking clip in the upper-outer left breast. Final Assessment: Post Procedure Mammograms for Marker Placement Electronically Signed   By:  Ammie Ferrier M.D.   On: 11/07/2017 15:47   [place] Lt Breast Bx W Loc Dev 1st Lesion Img Bx Spec US Guide  Addendum Date: 11/10/2017   ADDENDUM REPORT: 11/09/2017 08:26 ADDENDUM: Pathology revealed FIBROCYSTIC CHANGES, FOCAL INFLAMMATION CONSISTENT WITH CYST RUPTURE, PSEUDOANGIOMATOUS STROMAL HYPERPLASIA, of LEFT breast, 2:30 o'clock . This was found to be concordant by Dr. Ammie Ferrier. Pathology results were discussed with the patient by telephone. The patient reported doing well after the biopsy with tenderness at the site. Post biopsy instructions and care were reviewed and questions were answered. The patient was encouraged  to call [facility] for any additional concerns. The patient was instructed to return for annual screening mammography which she states she will have performed at [facility]. She was informed a reminder notice would be sent regarding this appointment. Pathology results reported by Roselind Messier, RN on 11/09/2017. Electronically Signed   By: Ammie Ferrier M.D.   On: 11/09/2017 08:26   Result Date: 11/10/2017 CLINICAL DATA:  53 year old female presenting for ultrasound-guided biopsy of a left breast mass. EXAM: ULTRASOUND GUIDED LEFT BREAST CORE NEEDLE BIOPSY COMPARISON:  Previous exam(s). FINDINGS: I met with the patient and we discussed the procedure of ultrasound-guided biopsy, including benefits and alternatives. We discussed the high likelihood of a successful procedure. We discussed the risks of the procedure, including infection, bleeding, tissue injury, clip migration, and inadequate sampling. Informed written consent was given. The usual time-out protocol was performed immediately prior to the procedure. Lesion quadrant: Upper-outer quadrant Using sterile technique and 1% Lidocaine as local anesthetic, under direct ultrasound visualization, a 14 gauge spring-loaded device was used to perform biopsy of a mass in the left  breast at 2:30 using an inferior approach. Initially, the mass appeared more anechoic than on the prior exam, however had some persistent mural nodularity. Therefore I attempted to aspirate, however the cyst did not entirely collapse. Therefore, I proceeded with biopsy. At the conclusion of the procedure a ribbon shaped tissue marker clip was deployed into the biopsy cavity. Follow up 2 view mammogram was performed and dictated separately. IMPRESSION: Ultrasound guided biopsy of a left breast mass at 2:30. No apparent complications. Electronically Signed: By: Ammie Ferrier M.D. On: 11/07/2017 15:20    Assessment & Plan:   Khamiya was seen today for hypertension, hyperlipidemia and coronary artery disease.  Diagnoses and all orders for this visit:  Essential hypertension- Her blood pressure is not adequately well controlled due to noncompliance.  She was praised for her lifestyle modifications.  Will try to control her blood pressure with nebivolol, triamterene, and hydrochlorothiazide. -     CBC with Differential/Platelet; Future -     Basic metabolic panel; Future -     EKG 12-Lead -     nebivolol (BYSTOLIC) 5 MG tablet; Take 1 tablet (5 mg total) by mouth daily. -     triamterene-hydrochlorothiazide (DYAZIDE) 37.5-25 MG capsule; Take 1 each (1 capsule total) by mouth daily.  Vitamin D deficiency disease -     Cholecalciferol (VITAMIN D3) 1.25 MG (50000 UT) CAPS; Take 1 capsule by mouth once a week.  Hyperlipidemia with target LDL less than 70- She has not achieved her LDL goal due to noncompliance.  I have asked her to restart high-dose intense statin therapy for CV risk reduction. -     atorvastatin (LIPITOR) 80 MG tablet; TAKE 1 TABLET BY MOUTH EVERY DAY -     Lipid panel; Future -     TSH; Future -     Hepatic function panel; Future  Atherosclerosis of native coronary artery of native heart without angina pectoris- She has had no recent episodes of angina.  Will try to improve her  risk factor modifications. -     atorvastatin (LIPITOR) 80 MG tablet; TAKE 1 TABLET BY MOUTH EVERY DAY -     aspirin 81 MG EC tablet; Take 1 tablet (81 mg total) by mouth daily. -     EKG 12-Lead -     nebivolol (BYSTOLIC) 5 MG tablet; Take 1 tablet (  5 mg total) by mouth daily.  Prediabetes- Her blood sugars are normal. -     Hemoglobin A1c; Future  Routine general medical examination at a health care facility  Other proteinuria- There is no protein in her urine today. -     Urinalysis, Routine w reflex microscopic; Future   I have discontinued [place]. Koo's metoprolol succinate, Evolocumab (REPATHA [place]), losartan, and spironolactone. I have also changed her aspirin. Additionally, I am having her start on nebivolol and triamterene-hydrochlorothiazide. Lastly, I am having her maintain her acetaminophen, atorvastatin, and Vitamin D3.  Meds ordered this encounter  Medications   atorvastatin (LIPITOR) 80 MG tablet    Sig: TAKE 1 TABLET BY MOUTH EVERY DAY    Dispense:  90 tablet    Refill:  1   Cholecalciferol (VITAMIN D3) 1.25 MG (50000 UT) CAPS    Sig: Take 1 capsule by mouth once a week.    Dispense:  12 capsule    Refill:  0   aspirin 81 MG EC tablet    Sig: Take 1 tablet (81 mg total) by mouth daily.    Dispense:  90 tablet    Refill:  1   nebivolol (BYSTOLIC) 5 MG tablet    Sig: Take 1 tablet (5 mg total) by mouth daily.    Dispense:  84 tablet    Refill:  0   triamterene-hydrochlorothiazide (DYAZIDE) 37.5-25 MG capsule    Sig: Take 1 each (1 capsule total) by mouth daily.    Dispense:  90 capsule    Refill:  0     Follow-up: Return in about 4 weeks (around 02/07/2019).  Scarlette Calico, MD

## 2019-01-11 ENCOUNTER — Other Ambulatory Visit (INDEPENDENT_AMBULATORY_CARE_PROVIDER_SITE_OTHER): Payer: Self-pay

## 2019-01-11 ENCOUNTER — Encounter: Payer: Self-pay | Admitting: Internal Medicine

## 2019-01-11 DIAGNOSIS — R808 Other proteinuria: Secondary | ICD-10-CM

## 2019-01-11 DIAGNOSIS — I10 Essential (primary) hypertension: Secondary | ICD-10-CM

## 2019-01-11 DIAGNOSIS — R7303 Prediabetes: Secondary | ICD-10-CM

## 2019-01-11 DIAGNOSIS — E785 Hyperlipidemia, unspecified: Secondary | ICD-10-CM

## 2019-01-11 LAB — BASIC METABOLIC PANEL
BUN: 16 mg/dL (ref 6–23)
CO2: 25 mEq/L (ref 19–32)
Calcium: 9.5 mg/dL (ref 8.4–10.5)
Chloride: 105 mEq/L (ref 96–112)
Creatinine, Ser: 0.75 mg/dL (ref 0.40–1.20)
GFR: 98.23 mL/min (ref >60.00)
Glucose, Bld: 87 mg/dL (ref 70–99)
Potassium: 4 mEq/L (ref 3.5–5.1)
Sodium: 138 mEq/L (ref 135–145)

## 2019-01-11 LAB — HEPATIC FUNCTION PANEL
ALT: 14 U/L (ref 0–35)
AST: 14 U/L (ref 0–37)
Albumin: 4.2 g/dL (ref 3.5–5.2)
Alkaline Phosphatase: 53 U/L (ref 39–117)
Bilirubin, Direct: 0.1 mg/dL (ref 0.0–0.3)
Total Bilirubin: 0.7 mg/dL (ref 0.2–1.2)
Total Protein: 7.3 g/dL (ref 6.0–8.3)

## 2019-01-11 LAB — LIPID PANEL
Cholesterol: 219 mg/dL — ABNORMAL HIGH (ref 0–200)
HDL: 56.6 mg/dL (ref >39.00)
LDL Cholesterol: 153 mg/dL — ABNORMAL HIGH (ref 0–99)
NonHDL: 161.94
Total CHOL/HDL Ratio: 4
Triglycerides: 45 mg/dL (ref 0.0–149.0)
VLDL: 9 mg/dL (ref 0.0–40.0)

## 2019-01-11 LAB — CBC WITH DIFFERENTIAL/PLATELET
Basophils Absolute: 0 10*3/uL (ref 0.0–0.1)
Basophils Relative: 0.6 % (ref 0.0–3.0)
Eosinophils Absolute: 0 10*3/uL (ref 0.0–0.7)
Eosinophils Relative: 0.6 % (ref 0.0–5.0)
HCT: 44.9 % (ref 36.0–46.0)
Hemoglobin: 15.2 g/dL — ABNORMAL HIGH (ref 12.0–15.0)
Lymphocytes Relative: 36.7 % (ref 12.0–46.0)
Lymphs Abs: 2.3 10*3/uL (ref 0.7–4.0)
MCHC: 33.8 g/dL (ref 30.0–36.0)
MCV: 93.2 fl (ref 78.0–100.0)
Monocytes Absolute: 0.5 10*3/uL (ref 0.1–1.0)
Monocytes Relative: 8 % (ref 3.0–12.0)
Neutro Abs: 3.5 10*3/uL (ref 1.4–7.7)
Neutrophils Relative %: 54.1 % (ref 43.0–77.0)
Platelets: 209 10*3/uL (ref 150.0–400.0)
RBC: 4.82 Mil/uL (ref 3.87–5.11)
RDW: 13.1 % (ref 11.5–15.5)
WBC: 6.4 10*3/uL (ref 4.0–10.5)

## 2019-01-11 LAB — URINALYSIS, ROUTINE W REFLEX MICROSCOPIC
Bilirubin Urine: NEGATIVE
Hgb urine dipstick: NEGATIVE
Ketones, ur: NEGATIVE
Leukocytes,Ua: NEGATIVE
Nitrite: NEGATIVE
Specific Gravity, Urine: 1.025 (ref 1.000–1.030)
Total Protein, Urine: NEGATIVE
Urine Glucose: NEGATIVE
Urobilinogen, UA: 0.2 (ref 0.0–1.0)
pH: 6.5 (ref 5.0–8.0)

## 2019-01-11 LAB — TSH: TSH: 1.32 u[IU]/mL (ref 0.35–4.50)

## 2019-01-11 LAB — HEMOGLOBIN A1C: Hgb A1c MFr Bld: 5.5 % (ref 4.6–6.5)

## 2019-02-13 ENCOUNTER — Other Ambulatory Visit: Payer: Self-pay

## 2019-02-13 ENCOUNTER — Ambulatory Visit (INDEPENDENT_AMBULATORY_CARE_PROVIDER_SITE_OTHER): Payer: Self-pay | Admitting: Internal Medicine

## 2019-02-13 ENCOUNTER — Other Ambulatory Visit (INDEPENDENT_AMBULATORY_CARE_PROVIDER_SITE_OTHER): Payer: Self-pay

## 2019-02-13 ENCOUNTER — Encounter: Payer: Self-pay | Admitting: Internal Medicine

## 2019-02-13 VITALS — BP 126/84 | HR 64 | Temp 97.9°F | Resp 16 | Ht 67.0 in | Wt 252.0 lb

## 2019-02-13 DIAGNOSIS — Z114 Encounter for screening for human immunodeficiency virus [HIV]: Secondary | ICD-10-CM

## 2019-02-13 DIAGNOSIS — I10 Essential (primary) hypertension: Secondary | ICD-10-CM

## 2019-02-13 LAB — BASIC METABOLIC PANEL
BUN: 13 mg/dL (ref 6–23)
CO2: 26 mEq/L (ref 19–32)
Calcium: 9.4 mg/dL (ref 8.4–10.5)
Chloride: 105 mEq/L (ref 96–112)
Creatinine, Ser: 0.74 mg/dL (ref 0.40–1.20)
GFR: 99.73 mL/min (ref >60.00)
Glucose, Bld: 87 mg/dL (ref 70–99)
Potassium: 3.7 mEq/L (ref 3.5–5.1)
Sodium: 139 mEq/L (ref 135–145)

## 2019-02-13 NOTE — Patient Instructions (Signed)

## 2019-02-13 NOTE — Progress Notes (Signed)
Subjective:  Patient ID: Jacqueline Ferrell, female    DOB: [birth year]  Age: 53 y.o. MRN: [id number]  CC: Hypertension   HPI Jacqueline Ferrell presents for f/up - She tells me her blood pressure has been well controlled.  She is doing 8 miles a day on an elliptical bike and does not experience CP, DOE, palpitations, edema, or fatigue.  She wants to be screened for HIV.  Outpatient Medications Prior to Visit  Medication Sig Dispense Refill  . acetaminophen (TYLENOL) 500 MG tablet Take 1,000 mg by mouth every 12 (twelve) hours as needed for moderate pain.    Marland Kitchen aspirin 81 MG EC tablet Take 1 tablet (81 mg total) by mouth daily. 90 tablet 1  . atorvastatin (LIPITOR) 80 MG tablet TAKE 1 TABLET BY MOUTH EVERY DAY 90 tablet 1  . Cholecalciferol (VITAMIN D3) 1.25 MG (50000 UT) CAPS Take 1 capsule by mouth once a week. 12 capsule 0  . nebivolol (BYSTOLIC) 5 MG tablet Take 1 tablet (5 mg total) by mouth daily. 84 tablet 0  . triamterene-hydrochlorothiazide (DYAZIDE) 37.5-25 MG capsule Take 1 each (1 capsule total) by mouth daily. 90 capsule 0   No facility-administered medications prior to visit.     ROS Review of Systems  Constitutional: Negative for diaphoresis, fatigue and unexpected weight change.  HENT: Negative.   Eyes: Negative for visual disturbance.  Respiratory: Negative for cough, chest tightness, shortness of breath and wheezing.   Cardiovascular: Negative for chest pain, palpitations and leg swelling.  Gastrointestinal: Negative for abdominal pain, diarrhea and nausea.  Endocrine: Negative.   Genitourinary: Negative.  Negative for difficulty urinating.  Musculoskeletal: Negative.  Negative for myalgias.  Skin: Negative.  Negative for color change.  Neurological: Negative.  Negative for dizziness, weakness, light-headedness and numbness.  Hematological: Negative for adenopathy. Does not bruise/bleed easily.  Psychiatric/Behavioral: Negative.     Objective:  BP 126/84 (BP Location:  Left Arm, Patient Position: Sitting, Cuff Size: Large)   Pulse 64   Temp 97.9 F (36.6 C) (Oral)   Resp 16   Ht _0  (1.702 m)   Wt 252 lb (114.3 kg)   LMP 02/10/2008   SpO2 98%   BMI 39.47 kg/m   BP Readings from Last 3 Encounters:  02/13/19 126/84  01/10/19 (!) 164/110  02/22/18 (!) 142/98    Wt Readings from Last 3 Encounters:  02/13/19 252 lb (114.3 kg)  01/10/19 257 lb (116.6 kg)  02/22/18 257 lb 4 oz (116.7 kg)    Physical Exam Vitals signs reviewed.  Constitutional:      Appearance: She is obese.  HENT:     Nose: Nose normal.     Mouth/Throat:     Mouth: Mucous membranes are moist.  Eyes:     General: No scleral icterus. Neck:     Musculoskeletal: Normal range of motion. No neck rigidity.  Cardiovascular:     Rate and Rhythm: Normal rate and regular rhythm.     Heart sounds: No murmur.  Pulmonary:     Effort: Pulmonary effort is normal. No respiratory distress.     Breath sounds: No stridor. No wheezing, rhonchi or rales.  Abdominal:     General: Abdomen is protuberant. Bowel sounds are normal. There is no distension.     Palpations: There is no hepatomegaly or splenomegaly.     Tenderness: There is no abdominal tenderness.  Musculoskeletal: Normal range of motion.     Right lower leg: No edema.  Left lower leg: No edema.  Lymphadenopathy:     Cervical: No cervical adenopathy.  Skin:    General: Skin is warm.     Coloration: Skin is not pale.  Neurological:     General: No focal deficit present.  Psychiatric:        Mood and Affect: Mood normal.        Behavior: Behavior normal.     Lab Results  Component Value Date   WBC 6.4 01/11/2019   HGB 15.2 (H) 01/11/2019   HCT 44.9 01/11/2019   PLT 209.0 01/11/2019   GLUCOSE 87 02/13/2019   CHOL 219 (H) 01/11/2019   TRIG 45.0 01/11/2019   HDL 56.60 01/11/2019   LDLCALC 153 (H) 01/11/2019   ALT 14 01/11/2019   AST 14 01/11/2019   NA 139 02/13/2019   K 3.7 02/13/2019   CL 105 02/13/2019    CREATININE 0.74 02/13/2019   BUN 13 02/13/2019   CO2 26 02/13/2019   TSH 1.32 01/11/2019   INR 1.1 (H) 09/11/2010   HGBA1C 5.5 01/11/2019    Mm Clip Placement Left  Result Date: 11/07/2017 CLINICAL DATA:  Post biopsy mammogram of the left breast for clip placement. EXAM: DIAGNOSTIC LEFT MAMMOGRAM POST ULTRASOUND BIOPSY COMPARISON:  Previous exam(s). FINDINGS: Mammographic images were obtained following ultrasound guided biopsy of mass in the left breast at 2:30. The ribbon shaped biopsy marking clip is well positioned at the site of biopsy in the slightly upper outer quadrant of the left breast. IMPRESSION: Appropriate positioning of the ribbon shaped biopsy marking clip in the upper-outer left breast. Final Assessment: Post Procedure Mammograms for Marker Placement Electronically Signed   By: Ammie Ferrier M.D.   On: 11/07/2017 15:47   [place] Lt Breast Bx W Loc Dev 1st Lesion Img Bx Spec US Guide  Addendum Date: 11/10/2017   ADDENDUM REPORT: 11/09/2017 08:26 ADDENDUM: Pathology revealed FIBROCYSTIC CHANGES, FOCAL INFLAMMATION CONSISTENT WITH CYST RUPTURE, PSEUDOANGIOMATOUS STROMAL HYPERPLASIA, of LEFT breast, 2:30 o'clock . This was found to be concordant by Dr. Ammie Ferrier. Pathology results were discussed with the patient by telephone. The patient reported doing well after the biopsy with tenderness at the site. Post biopsy instructions and care were reviewed and questions were answered. The patient was encouraged to call [facility] for any additional concerns. The patient was instructed to return for annual screening mammography which she states she will have performed at [facility]. She was informed a reminder notice would be sent regarding this appointment. Pathology results reported by Roselind Messier, RN on 11/09/2017. Electronically Signed   By: Ammie Ferrier M.D.   On: 11/09/2017 08:26   Result Date: 11/10/2017 CLINICAL DATA:   53 year old female presenting for ultrasound-guided biopsy of a left breast mass. EXAM: ULTRASOUND GUIDED LEFT BREAST CORE NEEDLE BIOPSY COMPARISON:  Previous exam(s). FINDINGS: I met with the patient and we discussed the procedure of ultrasound-guided biopsy, including benefits and alternatives. We discussed the high likelihood of a successful procedure. We discussed the risks of the procedure, including infection, bleeding, tissue injury, clip migration, and inadequate sampling. Informed written consent was given. The usual time-out protocol was performed immediately prior to the procedure. Lesion quadrant: Upper-outer quadrant Using sterile technique and 1% Lidocaine as local anesthetic, under direct ultrasound visualization, a 14 gauge spring-loaded device was used to perform biopsy of a mass in the left breast at 2:30 using an inferior approach. Initially, the mass appeared more anechoic than on  the prior exam, however had some persistent mural nodularity. Therefore I attempted to aspirate, however the cyst did not entirely collapse. Therefore, I proceeded with biopsy. At the conclusion of the procedure a ribbon shaped tissue marker clip was deployed into the biopsy cavity. Follow up 2 view mammogram was performed and dictated separately. IMPRESSION: Ultrasound guided biopsy of a left breast mass at 2:30. No apparent complications. Electronically Signed: By: Ammie Ferrier M.D. On: 11/07/2017 15:20    Assessment & Plan:   Jacqueline Ferrell was seen today for hypertension.  Diagnoses and all orders for this visit:  Essential hypertension- Her blood pressure is well controlled.  Electrolytes and renal function are normal.  Will continue the combination of nebivolol, triamterene, and hydrochlorothiazide. -     Basic metabolic panel; Future  Encounter for screening for HIV -     HIV Antibody (routine testing w rflx); Future   I am having Jacqueline Ferrell. Dwyer maintain her acetaminophen, atorvastatin, Vitamin D3,  aspirin, nebivolol, and triamterene-hydrochlorothiazide.  No orders of the defined types were placed in this encounter.    Follow-up: Return in about 6 months (around 08/16/2019).  Scarlette Calico, MD

## 2019-02-14 ENCOUNTER — Encounter: Payer: Self-pay | Admitting: Internal Medicine

## 2019-02-14 LAB — HIV ANTIBODY (ROUTINE TESTING W REFLEX): HIV 1&2 Ab, 4th Generation: NONREACTIVE

## 2019-03-13 NOTE — Progress Notes (Deleted)
Cardiology Office Note   Date:  03/13/2019   ID:  Jacqueline Ferrell, DOB 1966-05-14, MRN [id number]  PCP:  Janith Lima, MD  Cardiologist:   Minus Breeding, MD    No chief complaint on file.     History of Present Illness: Jacqueline Ferrell is a 53 y.o. female who presents for follow up of CAD.  She saw Dr. Marlou Porch greater than 3 years ago.   She had a drug-eluting stent to the LAD after progressive exertional chest pain and abnormal stress test. Her heart catheterization showed 80% proximal LAD stenosis and a stent was placed in March of 2012.   I saw her in 2018 when she was considering bariatric surgery.   She had no high risk findings on a perfusion study.   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  She does not get the shoulder pain for thedyspnea that she was having but she does get some dyspnea with exertion. She's not getting any palpitations, presyncope or syncope. She describes no PND or orthopnea.   Past Medical History:  Diagnosis Date  . Arthritis   . Bulging lumbar disc   . Coronary artery disease   . Hyperlipidemia   . Hypertension   . Nephrolithiasis   . Obese   . Poor compliance with medication     Past Surgical History:  Procedure Laterality Date  . ABDOMINAL HYSTERECTOMY  2009  . CORONARY ANGIOPLASTY WITH STENT PLACEMENT       Current Outpatient Medications  Medication Sig Dispense Refill  . acetaminophen (TYLENOL) 500 MG tablet Take 1,000 mg by mouth every 12 (twelve) hours as needed for moderate pain.    Marland Kitchen aspirin 81 MG EC tablet Take 1 tablet (81 mg total) by mouth daily. 90 tablet 1  . atorvastatin (LIPITOR) 80 MG tablet TAKE 1 TABLET BY MOUTH EVERY DAY 90 tablet 1  . Cholecalciferol (VITAMIN D3) 1.25 MG (50000 UT) CAPS Take 1 capsule by mouth once a week. 12 capsule 0  . nebivolol (BYSTOLIC) 5 MG tablet Take 1 tablet (5 mg total) by mouth daily. 84 tablet 0  .  triamterene-hydrochlorothiazide (DYAZIDE) 37.5-25 MG capsule Take 1 each (1 capsule total) by mouth daily. 90 capsule 0   No current facility-administered medications for this visit.     Allergies:   Lisinopril    ROS:  Please see the history of present illness.   Otherwise, review of systems are positive for ***.   All other systems are reviewed and negative.    PHYSICAL EXAM: VS:  LMP 02/10/2008  , BMI There is no height or weight on file to calculate BMI. GENERAL:  Well appearing NECK:  No jugular venous distention, waveform within normal limits, carotid upstroke brisk and symmetric, no bruits, no thyromegaly LUNGS:  Clear to auscultation bilaterally CHEST:  Unremarkable HEART:  PMI not displaced or sustained,S1 and S2 within normal limits, no S3, no S4, no clicks, no rubs, *** murmurs ABD:  Flat, positive bowel sounds normal in frequency in pitch, no bruits, no rebound, no guarding, no midline pulsatile mass, no hepatomegaly, no splenomegaly EXT:  2 plus pulses throughout, no edema, no cyanosis no clubbing   ***GEN:  No distress NECK:  No jugular venous distention at 90 degrees, waveform within normal limits, carotid upstroke brisk and symmetric, no bruits, no thyromegaly LYMPHATICS:  No cervical adenopathy LUNGS:  Clear to auscultation  bilaterally BACK:  No CVA tenderness CHEST:  Unremarkable HEART:  S1 and S2 within normal limits, no S3, no S4, no clicks, no rubs, no murmurs ABD:  Positive bowel sounds normal in frequency in pitch, no bruits, no rebound, no guarding, unable to assess midline mass or bruit with the patient seated. EXT:  2 plus pulses throughout, moderate edema, no cyanosis no clubbing SKIN:  No rashes no nodules NEURO:  Cranial nerves II through XII grossly intact, motor grossly intact throughout PSYCH:  Cognitively intact, oriented to person place and time     EKG:  EKG is ***ordered today. The ekg ordered today demonstrates sinus rhythm, rate ***, left  axis deviation, poor anterior R wave progression, no acute ST-T wave changes.   Recent Labs: 01/11/2019: ALT 14; Hemoglobin 15.2; Platelets 209.0; TSH 1.32 02/13/2019: BUN 13; Creatinine, Ser 0.74; Potassium 3.7; Sodium 139    Lipid Panel    Component Value Date/Time   CHOL 219 (H) 01/11/2019 0931   CHOL 129 12/07/2017 0943   TRIG 45.0 01/11/2019 0931   TRIG 81 07/28/2010 1630   HDL 56.60 01/11/2019 0931   HDL 41 12/07/2017 0943   CHOLHDL 4 01/11/2019 0931   VLDL 9.0 01/11/2019 0931   LDLCALC 153 (H) 01/11/2019 0931   LDLCALC 77 12/07/2017 0943      Wt Readings from Last 3 Encounters:  02/13/19 252 lb (114.3 kg)  01/10/19 257 lb (116.6 kg)  02/22/18 257 lb 4 oz (116.7 kg)      Other studies Reviewed: Additional studies/ records that were reviewed today include:   *** Review of the above records demonstrates:  ***   ASSESSMENT AND PLAN:  CAD:  *** This will be evaluated as above.    OBESITY:  *** I am Ferrell of her for taking the step to consider weight loss surgery.    HTN:  The blood pressure is at target. No change in medications is indicated. We will continue with therapeutic lifestyle changes (TLC).  This will come down closer to target with weight loss.    HYPERLIPIDEMIA:    Her LDL in the past was 272.  Most recently she had an LDL of 153.  *** She is still 189 on Lipitor high dose and she could not afford the Zetia.  She needs to be seen in the [facility].  I will check a fasting lipid profile.    Current medicines are reviewed at length with the patient today.  The patient does not have concerns regarding medicines.  The following changes have been made: ***  Labs/ tests ordered today include:  ***  No orders of the defined types were placed in this encounter.    Disposition:   FU with me in ***   Signed, Minus Breeding, MD  03/13/2019 12:14 PM    [facility]

## 2019-03-15 ENCOUNTER — Ambulatory Visit: Payer: No Typology Code available for payment source | Admitting: Cardiology

## 2019-03-30 ENCOUNTER — Other Ambulatory Visit: Payer: Self-pay | Admitting: Internal Medicine

## 2019-03-30 DIAGNOSIS — E559 Vitamin D deficiency, unspecified: Secondary | ICD-10-CM

## 2019-04-05 ENCOUNTER — Other Ambulatory Visit: Payer: Self-pay | Admitting: Internal Medicine

## 2019-04-05 DIAGNOSIS — I10 Essential (primary) hypertension: Secondary | ICD-10-CM

## 2019-04-24 ENCOUNTER — Telehealth: Payer: Self-pay | Admitting: *Deleted

## 2019-04-24 NOTE — Telephone Encounter (Signed)
A message was left, re: calling our office.

## 2019-04-30 ENCOUNTER — Telehealth: Payer: Self-pay | Admitting: *Deleted

## 2019-04-30 NOTE — Telephone Encounter (Signed)
A message was left, re: her follow up visit. 

## 2019-05-13 ENCOUNTER — Other Ambulatory Visit: Payer: Self-pay | Admitting: Internal Medicine

## 2019-05-13 DIAGNOSIS — I2510 Atherosclerotic heart disease of native coronary artery without angina pectoris: Secondary | ICD-10-CM

## 2019-06-21 ENCOUNTER — Telehealth: Payer: Self-pay | Admitting: *Deleted

## 2019-06-21 NOTE — Telephone Encounter (Signed)
06/18/19 doesn't have insurance at this time, will call us back. --Dewaine Oats

## 2019-11-27 ENCOUNTER — Other Ambulatory Visit: Payer: Self-pay | Admitting: Internal Medicine

## 2019-11-27 DIAGNOSIS — I2510 Atherosclerotic heart disease of native coronary artery without angina pectoris: Secondary | ICD-10-CM

## 2019-11-27 DIAGNOSIS — I10 Essential (primary) hypertension: Secondary | ICD-10-CM

## 2019-12-17 ENCOUNTER — Encounter: Payer: Self-pay | Admitting: Internal Medicine

## 2019-12-27 ENCOUNTER — Other Ambulatory Visit: Payer: Self-pay

## 2019-12-27 ENCOUNTER — Encounter: Payer: Self-pay | Admitting: Internal Medicine

## 2019-12-27 ENCOUNTER — Ambulatory Visit (INDEPENDENT_AMBULATORY_CARE_PROVIDER_SITE_OTHER): Payer: Self-pay | Admitting: Internal Medicine

## 2019-12-27 VITALS — BP 138/92 | HR 60 | Temp 98.1°F | Resp 16 | Ht 67.0 in | Wt 257.0 lb

## 2019-12-27 DIAGNOSIS — G4762 Sleep related leg cramps: Secondary | ICD-10-CM

## 2019-12-27 DIAGNOSIS — I2510 Atherosclerotic heart disease of native coronary artery without angina pectoris: Secondary | ICD-10-CM

## 2019-12-27 DIAGNOSIS — I10 Essential (primary) hypertension: Secondary | ICD-10-CM

## 2019-12-27 DIAGNOSIS — E559 Vitamin D deficiency, unspecified: Secondary | ICD-10-CM

## 2019-12-27 DIAGNOSIS — Z1231 Encounter for screening mammogram for malignant neoplasm of breast: Secondary | ICD-10-CM

## 2019-12-27 DIAGNOSIS — Z9114 Patient's other noncompliance with medication regimen: Secondary | ICD-10-CM

## 2019-12-27 DIAGNOSIS — R001 Bradycardia, unspecified: Secondary | ICD-10-CM

## 2019-12-27 DIAGNOSIS — E785 Hyperlipidemia, unspecified: Secondary | ICD-10-CM

## 2019-12-27 LAB — CBC WITH DIFFERENTIAL/PLATELET
Basophils Absolute: 0.1 10*3/uL (ref 0.0–0.1)
Basophils Relative: 1 % (ref 0.0–3.0)
Eosinophils Absolute: 0 10*3/uL (ref 0.0–0.7)
Eosinophils Relative: 0.4 % (ref 0.0–5.0)
HCT: 43 % (ref 36.0–46.0)
Hemoglobin: 14.6 g/dL (ref 12.0–15.0)
Lymphocytes Relative: 22.9 % (ref 12.0–46.0)
Lymphs Abs: 1.6 10*3/uL (ref 0.7–4.0)
MCHC: 34 g/dL (ref 30.0–36.0)
MCV: 93.5 fl (ref 78.0–100.0)
Monocytes Absolute: 0.5 10*3/uL (ref 0.1–1.0)
Monocytes Relative: 7.9 % (ref 3.0–12.0)
Neutro Abs: 4.6 10*3/uL (ref 1.4–7.7)
Neutrophils Relative %: 67.8 % (ref 43.0–77.0)
Platelets: 183 10*3/uL (ref 150.0–400.0)
RBC: 4.6 Mil/uL (ref 3.87–5.11)
RDW: 13.3 % (ref 11.5–15.5)
WBC: 6.8 10*3/uL (ref 4.0–10.5)

## 2019-12-27 LAB — LIPID PANEL
Cholesterol: 211 mg/dL — ABNORMAL HIGH (ref 0–200)
HDL: 48 mg/dL (ref >39.00)
LDL Cholesterol: 151 mg/dL — ABNORMAL HIGH (ref 0–99)
NonHDL: 163.06
Total CHOL/HDL Ratio: 4
Triglycerides: 59 mg/dL (ref 0.0–149.0)
VLDL: 11.8 mg/dL (ref 0.0–40.0)

## 2019-12-27 LAB — BASIC METABOLIC PANEL
BUN: 18 mg/dL (ref 6–23)
CO2: 29 mEq/L (ref 19–32)
Calcium: 9.9 mg/dL (ref 8.4–10.5)
Chloride: 101 mEq/L (ref 96–112)
Creatinine, Ser: 0.84 mg/dL (ref 0.40–1.20)
GFR: 85.87 mL/min (ref >60.00)
Glucose, Bld: 84 mg/dL (ref 70–99)
Potassium: 3.7 mEq/L (ref 3.5–5.1)
Sodium: 138 mEq/L (ref 135–145)

## 2019-12-27 LAB — VITAMIN D 25 HYDROXY (VIT D DEFICIENCY, FRACTURES): VITD: 25.12 ng/mL — ABNORMAL LOW (ref 30.00–100.00)

## 2019-12-27 LAB — MAGNESIUM: Magnesium: 1.8 mg/dL (ref 1.5–2.5)

## 2019-12-27 LAB — TSH: TSH: 1.08 u[IU]/mL (ref 0.35–4.50)

## 2019-12-27 MED ORDER — ATORVASTATIN CALCIUM 80 MG PO TABS: ORAL_TABLET | ORAL | 1 refills | Status: DC

## 2019-12-27 MED ORDER — NEBIVOLOL HCL 5 MG PO TABS: 5.0000 mg | ORAL_TABLET | Freq: Every day | ORAL | 0 refills | Status: DC

## 2019-12-27 MED ORDER — TRIAMTERENE-HCTZ 37.5-25 MG PO CAPS: 1.0000 | ORAL_CAPSULE | Freq: Every day | ORAL | 0 refills | Status: DC

## 2019-12-27 NOTE — Patient Instructions (Signed)

## 2019-12-27 NOTE — Progress Notes (Addendum)
Subjective:  Patient ID: Jacqueline Ferrell, female    DOB: 03/09/1966  Age: 54 y.o. MRN: [id number]  CC: Hypertension and Coronary Artery Disease  This visit occurred during the SARS-CoV-2 public health emergency.  Safety protocols were in place, including screening questions prior to the visit, additional usage of staff PPE, and extensive cleaning of exam room while observing appropriate contact time as indicated for disinfecting solutions.   HPI MENDE BISWELL presents for f/up - She tells me her blood pressure has been relatively well controlled though she is no longer taking nebivolol.  She tells me she is taking triamterene/hydrochlorothiazide.  According to prescription refills she would have recently missed some doses of this.  She complains of a slight increase in the lower extremity edema over the last few months.  She has nocturnal cramps in her legs, feet, and toes.  She does not experience cramping or claudication during the day.  She is active and denies any recent episodes of chest pain, shortness of breath, palpitations, diaphoresis, near syncope, dizziness, or lightheadedness.  Outpatient Medications Prior to Visit  Medication Sig Dispense Refill  . acetaminophen (TYLENOL) 500 MG tablet Take 1,000 mg by mouth every 12 (twelve) hours as needed for moderate pain.    Marland Kitchen aspirin 81 MG EC tablet TAKE 1 TABLET BY MOUTH EVERY DAY 90 tablet 1  . atorvastatin (LIPITOR) 80 MG tablet TAKE 1 TABLET BY MOUTH EVERY DAY 90 tablet 1  . Cholecalciferol (VITAMIN D3) 1.25 MG (50000 UT) CAPS TAKE 1 CAPSULE BY MOUTH ONE TIME PER WEEK 12 capsule 0  . triamterene-hydrochlorothiazide (DYAZIDE) 37.5-25 MG capsule TAKE 1 EACH (1 CAPSULE TOTAL) BY MOUTH DAILY. 90 capsule 1  . nebivolol (BYSTOLIC) 5 MG tablet Take 1 tablet (5 mg total) by mouth daily. (Patient not taking: Reported on 12/27/2019) 84 tablet 0   No facility-administered medications prior to visit.    ROS Review of Systems  Constitutional:  Negative for appetite change, diaphoresis, fatigue and unexpected weight change.  HENT: Negative.   Eyes: Negative for visual disturbance.  Respiratory: Negative for chest tightness, shortness of breath and wheezing.   Cardiovascular: Positive for leg swelling. Negative for chest pain and palpitations.  Gastrointestinal: Negative for abdominal pain, constipation, diarrhea, nausea and vomiting.  Endocrine: Negative.  Negative for cold intolerance and heat intolerance.  Genitourinary: Negative for difficulty urinating and dysuria.  Musculoskeletal: Positive for arthralgias (knees). Negative for back pain, myalgias and neck pain.  Skin: Negative.  Negative for color change and pallor.  Neurological: Negative.  Negative for dizziness, weakness, light-headedness, numbness and headaches.  Hematological: Negative for adenopathy. Does not bruise/bleed easily.  Psychiatric/Behavioral: Negative.     Objective:  BP (!) 138/92 (BP Location: Left Arm, Patient Position: Sitting, Cuff Size: Large)   Pulse 60   Temp 98.1 F (36.7 C) (Oral)   Resp 16   Ht 5' 7"  (1.702 m)   Wt 257 lb (116.6 kg)   LMP 02/10/2008   SpO2 99%   BMI 40.25 kg/m   BP Readings from Last 3 Encounters:  12/27/19 (!) 138/92  02/13/19 126/84  01/10/19 (!) 164/110    Wt Readings from Last 3 Encounters:  12/27/19 257 lb (116.6 kg)  02/13/19 252 lb (114.3 kg)  01/10/19 257 lb (116.6 kg)    Physical Exam Vitals reviewed.  Constitutional:      Appearance: She is obese.  HENT:     Nose: Nose normal.     Mouth/Throat:  Mouth: Mucous membranes are moist.  Eyes:     General: No scleral icterus.    Conjunctiva/sclera: Conjunctivae normal.  Cardiovascular:     Rate and Rhythm: Regular rhythm. Bradycardia present.     Heart sounds: No murmur heard.      Comments: EKG - Sinus bradycardia, 52 bpm No Q waves No LVH Pulmonary:     Effort: Pulmonary effort is normal.     Breath sounds: No stridor. No wheezing,  rhonchi or rales.  Abdominal:     General: Abdomen is protuberant. Bowel sounds are normal. There is no distension.     Palpations: Abdomen is soft. There is no hepatomegaly, splenomegaly or mass.     Tenderness: There is no abdominal tenderness.  Musculoskeletal:     Cervical back: Neck supple.     Right lower leg: 1+ Edema present.     Left lower leg: 1+ Edema present.  Lymphadenopathy:     Cervical: No cervical adenopathy.  Skin:    General: Skin is warm and dry.  Neurological:     General: No focal deficit present.     Mental Status: She is alert and oriented to person, place, and time. Mental status is at baseline.  Psychiatric:        Mood and Affect: Mood normal.        Behavior: Behavior normal.     Lab Results  Component Value Date   WBC 6.8 12/27/2019   HGB 14.6 12/27/2019   HCT 43.0 12/27/2019   PLT 183.0 12/27/2019   GLUCOSE 84 12/27/2019   CHOL 211 (H) 12/27/2019   TRIG 59.0 12/27/2019   HDL 48.00 12/27/2019   LDLCALC 151 (H) 12/27/2019   ALT 14 01/11/2019   AST 14 01/11/2019   NA 138 12/27/2019   K 3.7 12/27/2019   CL 101 12/27/2019   CREATININE 0.84 12/27/2019   BUN 18 12/27/2019   CO2 29 12/27/2019   TSH 1.08 12/27/2019   INR 1.1 (H) 09/11/2010   HGBA1C 5.5 01/11/2019    MM CLIP PLACEMENT LEFT  Result Date: 11/07/2017 CLINICAL DATA:  Post biopsy mammogram of the left breast for clip placement. EXAM: DIAGNOSTIC LEFT MAMMOGRAM POST ULTRASOUND BIOPSY COMPARISON:  Previous exam(s). FINDINGS: Mammographic images were obtained following ultrasound guided biopsy of mass in the left breast at 2:30. The ribbon shaped biopsy marking clip is well positioned at the site of biopsy in the slightly upper outer quadrant of the left breast. IMPRESSION: Appropriate positioning of the ribbon shaped biopsy marking clip in the upper-outer left breast. Final Assessment: Post Procedure Mammograms for Marker Placement Electronically Signed   By: Ammie Ferrier M.D.   On:  11/07/2017 15:47   [place] LT BREAST BX W LOC DEV 1ST LESION IMG BX SPEC US GUIDE  Addendum Date: 11/10/2017   ADDENDUM REPORT: 11/09/2017 08:26 ADDENDUM: Pathology revealed FIBROCYSTIC CHANGES, FOCAL INFLAMMATION CONSISTENT WITH CYST RUPTURE, PSEUDOANGIOMATOUS STROMAL HYPERPLASIA, of LEFT breast, 2:30 o'clock . This was found to be concordant by Dr. Ammie Ferrier. Pathology results were discussed with the patient by telephone. The patient reported doing well after the biopsy with tenderness at the site. Post biopsy instructions and care were reviewed and questions were answered. The patient was encouraged to call [facility] for any additional concerns. The patient was instructed to return for annual screening mammography which she states she will have performed at [facility]. She was informed a reminder notice would be sent regarding  this appointment. Pathology results reported by Roselind Messier, RN on 11/09/2017. Electronically Signed   By: Ammie Ferrier M.D.   On: 11/09/2017 08:26   Result Date: 11/10/2017 CLINICAL DATA:  54 year old female presenting for ultrasound-guided biopsy of a left breast mass. EXAM: ULTRASOUND GUIDED LEFT BREAST CORE NEEDLE BIOPSY COMPARISON:  Previous exam(s). FINDINGS: I met with the patient and we discussed the procedure of ultrasound-guided biopsy, including benefits and alternatives. We discussed the high likelihood of a successful procedure. We discussed the risks of the procedure, including infection, bleeding, tissue injury, clip migration, and inadequate sampling. Informed written consent was given. The usual time-out protocol was performed immediately prior to the procedure. Lesion quadrant: Upper-outer quadrant Using sterile technique and 1% Lidocaine as local anesthetic, under direct ultrasound visualization, a 14 gauge spring-loaded device was used to perform biopsy of a mass in the left breast at 2:30 using an  inferior approach. Initially, the mass appeared more anechoic than on the prior exam, however had some persistent mural nodularity. Therefore I attempted to aspirate, however the cyst did not entirely collapse. Therefore, I proceeded with biopsy. At the conclusion of the procedure a ribbon shaped tissue marker clip was deployed into the biopsy cavity. Follow up 2 view mammogram was performed and dictated separately. IMPRESSION: Ultrasound guided biopsy of a left breast mass at 2:30. No apparent complications. Electronically Signed: By: Ammie Ferrier M.D. On: 11/07/2017 15:20    Assessment & Plan:   Quisha was seen today for hypertension and coronary artery disease.  Diagnoses and all orders for this visit:  Nocturnal leg cramps- Her labs are negative for any secondary causes.  I think she would benefit from taking a dose of magnesium at bedtime. -     Magnesium; Future -     Basic metabolic panel; Future -     Basic metabolic panel -     Magnesium -     Magnesium Oxide 250 MG TABS; Take 1 tablet (250 mg total) by mouth at bedtime.  Essential hypertension- Her blood pressure is not adequately well controlled.  Her heart rate is too low to restart nebivolol.  I have asked to be more compliant with the diuretic regimen and to improve her lifestyle modifications. -     Discontinue: nebivolol (BYSTOLIC) 5 MG tablet; Take 1 tablet (5 mg total) by mouth daily. -     CBC with Differential/Platelet; Future -     Magnesium; Future -     Basic metabolic panel; Future -     EKG 12-Lead -     triamterene-hydrochlorothiazide (DYAZIDE) 37.5-25 MG capsule; Take 1 each (1 capsule total) by mouth daily. -     Basic metabolic panel -     Magnesium -     CBC with Differential/Platelet  Atherosclerosis of native coronary artery of native heart without angina pectoris-she has had no recent episodes of angina but she does report lower extremity edema.  There is no evidence of ischemia on the EKG.  I have  asked her to follow-up with cardiology as soon as possible. -     Discontinue: nebivolol (BYSTOLIC) 5 MG tablet; Take 1 tablet (5 mg total) by mouth daily. -     Lipid panel; Future -     atorvastatin (LIPITOR) 80 MG tablet; TAKE 1 TABLET BY MOUTH EVERY DAY -     EKG 12-Lead -     TSH; Future -     Ambulatory referral to Cardiology -  TSH -     Lipid panel  Hyperlipidemia with target LDL less than 70- She has not achieved her LDL goal due to noncompliance.  I have asked her to restart the statin. -     Lipid panel; Future -     atorvastatin (LIPITOR) 80 MG tablet; TAKE 1 TABLET BY MOUTH EVERY DAY -     Lipid panel  Vitamin D deficiency disease- Her vitamin D level is low.  I have asked her to take 2000 IUs of vitamin D once a day. -     VITAMIN D 25 Hydroxy (Vit-D Deficiency, Fractures); Future -     VITAMIN D 25 Hydroxy (Vit-D Deficiency, Fractures)  Bradycardia by electrocardiogram- Her labs to screen for secondary causes are negative.  I have asked her to be evaluated by cardiology for this. -     TSH; Future -     Ambulatory referral to Cardiology -     TSH  Visit for screening mammogram -     MM DIGITAL SCREENING BILATERAL; Future   I have discontinued Bryar Rennie. Nordlund's nebivolol, Vitamin D3, and nebivolol. I am also having her start on Magnesium Oxide and Cholecalciferol. Additionally, I am having her maintain her acetaminophen, aspirin, atorvastatin, and triamterene-hydrochlorothiazide.  Meds ordered this encounter  Medications  . DISCONTD: nebivolol (BYSTOLIC) 5 MG tablet    Sig: Take 1 tablet (5 mg total) by mouth daily.    Dispense:  105 tablet    Refill:  0  . atorvastatin (LIPITOR) 80 MG tablet    Sig: TAKE 1 TABLET BY MOUTH EVERY DAY    Dispense:  90 tablet    Refill:  1  . triamterene-hydrochlorothiazide (DYAZIDE) 37.5-25 MG capsule    Sig: Take 1 each (1 capsule total) by mouth daily.    Dispense:  90 capsule    Refill:  0  . Magnesium Oxide 250 MG TABS      Sig: Take 1 tablet (250 mg total) by mouth at bedtime.    Dispense:  90 tablet    Refill:  1  . Cholecalciferol 50 MCG (2000 UT) TABS    Sig: Take 1 tablet (2,000 Units total) by mouth daily.    Dispense:  90 tablet    Refill:  3     Follow-up: Return in about 3 months (around 03/28/2020).  Scarlette Calico, MD

## 2019-12-28 MED ORDER — CHOLECALCIFEROL 50 MCG (2000 UT) PO TABS: 1.0000 | ORAL_TABLET | Freq: Every day | ORAL | 3 refills | Status: DC

## 2019-12-28 MED ORDER — MAGNESIUM OXIDE 250 MG PO TABS: 1.0000 | ORAL_TABLET | Freq: Every day | ORAL | 1 refills | Status: AC

## 2019-12-28 NOTE — Addendum Note (Signed)
Addended by: Janith Lima on: 12/28/2019 10:53 AM   Modules accepted: Orders

## 2020-01-05 DIAGNOSIS — Z7189 Other specified counseling: Secondary | ICD-10-CM | POA: Insufficient documentation

## 2020-01-05 NOTE — Progress Notes (Addendum)
Cardiology Office Note   Date:  01/07/2020   ID:  Jacqueline Ferrell, DOB [birth year], MRN [id number]  PCP:  Janith Lima, MD  Cardiologist:   Minus Breeding, MD    Chief Complaint  Patient presents with  . Coronary Artery Disease      History of Present Illness: Jacqueline Ferrell is a 54 y.o. female who presents for follow up of CAD.  She saw Dr. Marlou Porch greater than 3 years ago.   She had a drug-eluting stent to the LAD after progressive exertional chest pain and abnormal stress test. Her heart catheterization showed 80% proximal LAD stenosis and a stent was placed in March of 2012.  She had a negative perfusion study in 2018 as she was considering having bariatric surgery.     Since I last saw her she is actually done well.  She had bariatric surgery and she lost 100 pounds!  She is not having any cardiovascular symptoms similar to what she had in 2012. The patient denies any new symptoms such as chest discomfort, neck or arm discomfort. There has been no new shortness of breath, PND or orthopnea. There have been no reported palpitations, presyncope or syncope.  She has been limited by a "pinched nerve".  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    Past Medical History:  Diagnosis Date  . Arthritis   . Bulging lumbar disc   . Coronary artery disease   . Hyperlipidemia   . Hypertension   . Nephrolithiasis   . Obese   . Poor compliance with medication     Past Surgical History:  Procedure Laterality Date  . ABDOMINAL HYSTERECTOMY  2009  . BARIATRIC SURGERY    . CORONARY ANGIOPLASTY WITH STENT PLACEMENT       Current Outpatient Medications  Medication Sig Dispense Refill  . acetaminophen (TYLENOL) 500 MG tablet Take 1,000 mg by mouth every 12 (twelve) hours as needed for moderate pain.    Marland Kitchen aspirin 81 MG EC tablet TAKE 1 TABLET BY MOUTH EVERY DAY 90 tablet 1  . atorvastatin (LIPITOR) 80 MG tablet TAKE 1 TABLET BY  MOUTH EVERY DAY 90 tablet 1  . Cholecalciferol 50 MCG (2000 UT) TABS Take 1 tablet (2,000 Units total) by mouth daily. 90 tablet 3  . Magnesium Oxide 250 MG TABS Take 1 tablet (250 mg total) by mouth at bedtime. 90 tablet 1  . triamterene-hydrochlorothiazide (DYAZIDE) 37.5-25 MG capsule Take 1 each (1 capsule total) by mouth daily. 90 capsule 0   No current facility-administered medications for this visit.    Allergies:   Lisinopril    ROS:  Please see the history of present illness.   Otherwise, review of systems are positive for none.   All other systems are reviewed and negative.    PHYSICAL EXAM: VS:  BP 130/70   Pulse 71   Temp (!) 97 F (36.1 C)   Ht 5\' 7"  (1.702 m)   Wt 260 lb 9.6 oz (118.2 kg)   LMP 02/10/2008   SpO2 95%   BMI 40.82 kg/m  , BMI Body mass index is 40.82 kg/m. GEN:  No distress NECK:  No jugular venous distention at 90 degrees, waveform within normal limits, carotid upstroke brisk and symmetric, no bruits, no thyromegaly LYMPHATICS:  No cervical adenopathy LUNGS:  Clear to auscultation bilaterally BACK:  No CVA tenderness CHEST:  Unremarkable HEART:  S1  and S2 within normal limits, no S3, no S4, no clicks, no rubs, no murmurs ABD:  Positive bowel sounds normal in frequency in pitch, no bruits, no rebound, no guarding, unable to assess midline mass or bruit with the patient seated. EXT:  2 plus pulses throughout, trace edema, no cyanosis no clubbing    EKG:  EKG is  ordered today. The ekg ordered today demonstrates sinus rhythm, rate 71, left axis deviation, poor anterior R wave progression, no acute ST-T wave changes.   Recent Labs: 01/11/2019: ALT 14 12/27/2019: BUN 18; Creatinine, Ser 0.84; Hemoglobin 14.6; Magnesium 1.8; Platelets 183.0; Potassium 3.7; Sodium 138; TSH 1.08    Lipid Panel    Component Value Date/Time   CHOL 211 (H) 12/27/2019 0944   CHOL 129 12/07/2017 0943   TRIG 59.0 12/27/2019 0944   TRIG 81 07/28/2010 1630   HDL 48.00  12/27/2019 0944   HDL 41 12/07/2017 0943   CHOLHDL 4 12/27/2019 0944   VLDL 11.8 12/27/2019 0944   LDLCALC 151 (H) 12/27/2019 0944   LDLCALC 77 12/07/2017 0943      Wt Readings from Last 3 Encounters:  01/07/20 260 lb 9.6 oz (118.2 kg)  12/27/19 257 lb (116.6 kg)  02/13/19 252 lb (114.3 kg)      Other studies Reviewed: Additional studies/ records that were reviewed today include: Labs. Review of the above records demonstrates:  See elsewhere  ASSESSMENT AND PLAN:   CAD:   The patient has no new symptoms since her stress test in 2018.  No further cardiovascular testing is indicated.  We will continue with aggressive risk reduction and meds as listed. I thought I have OBESITY:  I am proud of her weight loss.  I encourage more of the same.   HTN:  The blood pressure is at target.  No change in therapy.   HYPERLIPIDEMIA:    Her LDL is 151 despite max dose Lipitor.  She had had an excellent result on Repatha but could not get this refilled as she has not seen [place] in a while.  And then again her back on this through our [facility].  The goal being LDL less than 70 and a 50% reduction.   COVID EDUCATION: She has had her vaccine  Current medicines are reviewed at length with the patient today.  The patient does not have concerns regarding medicines.  The following changes have been made:   As above Labs/ tests ordered today include: None  Orders Placed This Encounter  Procedures  . EKG 12-Lead     Disposition:   FU with me in 12 months.     Signed, Minus Breeding, MD  01/07/2020 9:26 AM    [facility]

## 2020-01-07 ENCOUNTER — Other Ambulatory Visit: Payer: Self-pay

## 2020-01-07 ENCOUNTER — Encounter: Payer: Self-pay | Admitting: Cardiology

## 2020-01-07 ENCOUNTER — Ambulatory Visit (INDEPENDENT_AMBULATORY_CARE_PROVIDER_SITE_OTHER): Payer: Self-pay | Admitting: Cardiology

## 2020-01-07 VITALS — BP 130/70 | HR 71 | Temp 97.0°F | Ht 67.0 in | Wt 260.6 lb

## 2020-01-07 DIAGNOSIS — I2510 Atherosclerotic heart disease of native coronary artery without angina pectoris: Secondary | ICD-10-CM

## 2020-01-07 DIAGNOSIS — I10 Essential (primary) hypertension: Secondary | ICD-10-CM

## 2020-01-07 DIAGNOSIS — E785 Hyperlipidemia, unspecified: Secondary | ICD-10-CM

## 2020-01-07 DIAGNOSIS — Z7189 Other specified counseling: Secondary | ICD-10-CM

## 2020-01-07 NOTE — Patient Instructions (Signed)
Medication Instructions:  NO CHANGE *If you need a refill on your cardiac medications before your next appointment, please call your pharmacy*   Lab Work: If you have labs (blood work) drawn today and your tests are completely normal, you will receive your results only by: Marland Kitchen MyChart Message (if you have MyChart) OR . A paper copy in the mail If you have any lab test that is abnormal or we need to change your treatment, we will call you to review the results.   Follow-Up: At [facility], you and your health needs are our priority.  As part of our continuing mission to provide you with exceptional heart care, we have created designated Provider Care Teams.  These Care Teams include your primary Cardiologist (physician) and Advanced Practice Providers (APPs -  Physician Assistants and Nurse Practitioners) who all work together to provide you with the care you need, when you need it.  We recommend signing up for the patient portal called "MyChart".  Sign up information is provided on this After Visit Summary.  MyChart is used to connect with patients for Virtual Visits (Telemedicine).  Patients are able to view lab/test results, encounter notes, upcoming appointments, etc.  Non-urgent messages can be sent to your provider as well.   To learn more about what you can do with MyChart, go to [email or web address].    Your next appointment:   12 month(s)  The format for your next appointment:   Either In Person or Virtual  Provider:   You may see Minus Breeding MD or one of the following Advanced Practice Providers on your designated Care Team:    Rosaria Ferries, PA-C  Jory Sims, DNP, ANP  Cadence Kathlen Mody, NP    Other Instructions  FOLLOW UP WITH THE LIPID CLINIC TO RESTART REPATHA

## 2020-01-11 ENCOUNTER — Ambulatory Visit
Admission: RE | Admit: 2020-01-11 | Discharge: 2020-01-11 | Disposition: A | Discharge status: Home / Self Care | Payer: PRIVATE HEALTH INSURANCE | Source: Ambulatory Visit | Attending: Internal Medicine | Admitting: Internal Medicine

## 2020-01-11 ENCOUNTER — Other Ambulatory Visit: Payer: Self-pay

## 2020-01-11 DIAGNOSIS — Z1231 Encounter for screening mammogram for malignant neoplasm of breast: Secondary | ICD-10-CM

## 2020-01-11 LAB — HM MAMMOGRAPHY

## 2020-02-06 ENCOUNTER — Telehealth: Payer: Self-pay

## 2020-02-06 NOTE — Telephone Encounter (Signed)
-----   Message from [facility], [place] sent at 12/27/2019  8:50 AM EDT ----- Regarding: COVID Vaccine Send message requesting dates.

## 2020-02-27 ENCOUNTER — Telehealth: Payer: Self-pay | Admitting: Internal Medicine

## 2020-02-27 ENCOUNTER — Telehealth (INDEPENDENT_AMBULATORY_CARE_PROVIDER_SITE_OTHER): Payer: Self-pay | Admitting: Internal Medicine

## 2020-02-27 ENCOUNTER — Encounter: Payer: Self-pay | Admitting: Internal Medicine

## 2020-02-27 VITALS — BP 132/76 | Wt 255.0 lb

## 2020-02-27 DIAGNOSIS — E785 Hyperlipidemia, unspecified: Secondary | ICD-10-CM

## 2020-02-27 DIAGNOSIS — I2510 Atherosclerotic heart disease of native coronary artery without angina pectoris: Secondary | ICD-10-CM

## 2020-02-27 DIAGNOSIS — I10 Essential (primary) hypertension: Secondary | ICD-10-CM

## 2020-02-27 MED ORDER — REPATHA SURECLICK 140 MG/ML ~~LOC~~ SOAJ: 1.0000 | SUBCUTANEOUS | 3 refills | Status: DC

## 2020-02-27 NOTE — Progress Notes (Signed)
Virtual Visit via Telephone Note   This visit type was conducted due to national recommendations for restrictions regarding the COVID-19 Pandemic (e.g. social distancing) in an effort to limit this patient's exposure and mitigate transmission in our community.  Due to her co-morbid illnesses, this patient is at least at moderate risk for complications without adequate follow up.  This format is felt to be most appropriate for this patient at this time.  The patient did not have access to video technology/had technical difficulties with video requiring transitioning to audio format only (telephone).  All issues noted in this document were discussed and addressed.  No physical exam could be performed with this format.  Please refer to the patient's chart for her  consent to telehealth for [facility].   Date:  02/27/2020   ID:  Jacqueline Ferrell, DOB 1966-03-27, MRN [id number] The patient was identified using 2 identifiers.  Evaluation Performed:  New Patient Evaluation  Patient Location:  [place] [address]  Provider location:   [address], [place], [place] [address]  PCP:  Janith Lima, MD  Cardiologist:  No primary care provider on file. Electrophysiologist:  None   Chief Complaint:  Manage dyslipidemia  History of Present Illness:    Jacqueline Ferrell is a 54 y.o. female who presents via audio/video conferencing for a telehealth visit today.  This is a 54 year old female patient who has a history of coronary artery disease with prior DES to the LAD in 2012.  She previously followed with Dr. Marlou Porch but was lost to follow-up and recently reestablished care with Dr. Percival Spanish.  She also had morbid obesity but had bariatric surgery and made dietary changes and ultimately lost 100 pounds.  Overall she says she feels well denies any anginal symptoms.  Previously she had been on high-dose atorvastatin 80 mg daily but did not reach a target LDL less than 70.   She was subsequently was started on Repatha 140 mg every 2 weeks with marked improvement in her lipids and an LDL cholesterol of 77 approximately 2 years ago.  Unfortunately she ran out of the medication due to loss of follow-up and her LDL has now rebounded to 151.  She reported no significant side effects on Repatha and sounds like she was initially getting some support from [place].  The patient does not have symptoms concerning for COVID-19 infection (fever, chills, cough, or new SHORTNESS OF BREATH).    Prior CV studies:   The following studies were reviewed today:  Chart reviewed, labs  PMHx:  Past Medical History:  Diagnosis Date  . Arthritis   . Bulging lumbar disc   . Coronary artery disease   . Hyperlipidemia   . Hypertension   . Nephrolithiasis   . Obese   . Poor compliance with medication     Past Surgical History:  Procedure Laterality Date  . ABDOMINAL HYSTERECTOMY  2009  . BARIATRIC SURGERY    . CORONARY ANGIOPLASTY WITH STENT PLACEMENT      FAMHx:  Family History  Problem Relation Age of Onset  . Heart attack Father 44       MI  . Heart disease Father   . Hypertension Mother   . Breast cancer Mother   . Hyperlipidemia Sister   . Hypertension Brother   . Hypertension Brother   . Hypertension Other   . Hyperlipidemia Other   . Heart attack Cousin        5 first  cousins with MI's in their 57s  . Diabetes Daughter   . Breast cancer Paternal Aunt   . Breast cancer Maternal Grandmother   . Breast cancer Paternal Grandmother   . Coronary artery disease Neg Hx   . Stroke Neg Hx   . Alcohol abuse Neg Hx   . Cancer Neg Hx   . Early death Neg Hx     SOCHx:   reports that she has never smoked. She has never used smokeless tobacco. She reports current alcohol use. She reports that she does not use drugs.  ALLERGIES:  Allergies  Allergen Reactions  . Lisinopril Other (See Comments)    REACTION: Tongue swelling    MEDS:  Current Meds  Medication Sig   . acetaminophen (TYLENOL) 500 MG tablet Take 1,000 mg by mouth every 12 (twelve) hours as needed for moderate pain.  Marland Kitchen aspirin 81 MG EC tablet TAKE 1 TABLET BY MOUTH EVERY DAY  . atorvastatin (LIPITOR) 80 MG tablet TAKE 1 TABLET BY MOUTH EVERY DAY  . Cholecalciferol 50 MCG (2000 UT) TABS Take 1 tablet (2,000 Units total) by mouth daily.  . Magnesium Oxide 250 MG TABS Take 1 tablet (250 mg total) by mouth at bedtime.  . triamterene-hydrochlorothiazide (DYAZIDE) 37.5-25 MG capsule Take 1 each (1 capsule total) by mouth daily.     ROS: Pertinent items noted in HPI and remainder of comprehensive ROS otherwise negative.  Labs/Other Tests and Data Reviewed:    Recent Labs: 12/27/2019: BUN 18; Creatinine, Ser 0.84; Hemoglobin 14.6; Magnesium 1.8; Platelets 183.0; Potassium 3.7; Sodium 138; TSH 1.08   Recent Lipid Panel Lab Results  Component Value Date/Time   CHOL 211 (H) 12/27/2019 09:44 AM   CHOL 129 12/07/2017 09:43 AM   TRIG 59.0 12/27/2019 09:44 AM   TRIG 81 07/28/2010 04:30 PM   HDL 48.00 12/27/2019 09:44 AM   HDL 41 12/07/2017 09:43 AM   CHOLHDL 4 12/27/2019 09:44 AM   LDLCALC 151 (H) 12/27/2019 09:44 AM   LDLCALC 77 12/07/2017 09:43 AM    Wt Readings from Last 3 Encounters:  02/27/20 255 lb (115.7 kg)  01/07/20 260 lb 9.6 oz (118.2 kg)  12/27/19 257 lb (116.6 kg)     Exam:    Vital Signs:  BP 132/76   Wt 255 lb (115.7 kg)   LMP 02/10/2008   BMI 39.94 kg/m    Exam not performed due to telephone visit  ASSESSMENT & PLAN:    1. Mixed dyslipidemia, possible familial hyperlipidemia-goal LDL less than 70 2. CAD with DES to the LAD in 2012 3. Morbid obesity with recent 100 pound weight loss status post bariatric surgery 4. Essential hypertension  Ms. Brion is a pleasant 54 year old female previously was on high potency atorvastatin and Repatha with marked reduction in LDL cholesterol to 77 however was not able to renew her Repatha and her cholesterol has subsequently  rebounded.  She is establishing today with the lipid clinic to [place] which is indicated to target LDL less than 70.  She reported it was well-tolerated and it was clearly effective.  Of note despite her 100 pound weight loss and dietary changes, her cholesterol still remains elevated with an LDL of 150 on 80 mg atorvastatin, suggesting that her LDL is likely greater than 200 untreated which may be consistent with a familial hyperlipidemia, especially given her very early onset heart disease with PCI at age 33.  Thanks again for the kind referral.  COVID-19 Education: The signs and symptoms  of COVID-19 were discussed with the patient and how to seek care for testing (follow up with PCP or arrange E-visit).  The importance of social distancing was discussed today.  Patient Risk:   After full review of this patients clinical status, I feel that they are at least moderate risk at this time.  Time:   Today, I have spent 25 minutes with the patient with telehealth technology discussing dyslipidemia, possible familial hyperlipidemia, coronary artery disease, PCSK9 inhibitors.     Medication Adjustments/Labs and Tests Ordered: Current medicines are reviewed at length with the patient today.  Concerns regarding medicines are outlined above.   Tests Ordered: No orders of the defined types were placed in this encounter.   Medication Changes: No orders of the defined types were placed in this encounter.   Disposition:  in 3 month(s)  Pixie Casino, MD, [facility], [place] Director of the Advanced Lipid Disorders &  Cardiovascular Risk Reduction Clinic Diplomate of the American Board of Clinical Lipidology Attending Cardiologist  Direct Dial: [phone]  Fax: [phone]  Website:  [email or web address]  Pixie Casino, MD  02/27/2020 9:05 AM

## 2020-02-27 NOTE — Patient Instructions (Signed)
Medication Instructions:  Dr. Debara Pickett recommends Repatha 140mg /mL (PCSK9). This is an injectable cholesterol medication self-administered once every 14 days. This medication will likely need prior approval with your insurance company, which we will work on. If the medication is not approved initially, we may need to do an appeal with your insurance. We will keep you updated on this process.   Administer medication in area of fatty tissue such as abdomen, outer thigh, back up of arm - and rotate site with each injection Store medication in refrigerator until ready to administer - allow to sit at room temp for 30 mins - 1 hour prior to injection Dispose of medication in a SHARPS container - your pharmacy should be able to direct you on this and proper disposal   If you need co-pay assistance grant, please look into the program at healthwellfoundation.org >> disease funds >> hypercholesterolemia. This is an online application or you can call to complete. Once approved, you will provide the "pharmacy card" information to your pharmacy and they will deduct the co-pays from this grant.  If you need a co-pay card for Repatha: [email or web address] >> paying for Repatha or red box that says "Repatha Copay Card" in top right If you need a co-pay card for Praluent: WedMap.it >> starting & paying for Praluent  *If you need a refill on your cardiac medications before your next appointment, please call your pharmacy*   Lab Work: FASTING lipid panel in 3 months to check cholesterol   If you have labs (blood work) drawn today and your tests are completely normal, you will receive your results only by: Marland Kitchen MyChart Message (if you have MyChart) OR . A paper copy in the mail If you have any lab test that is abnormal or we need to change your treatment, we will call you to review the results.   Testing/Procedures: NONE   Follow-Up: At [facility], you and your health needs are our priority.  As part of our  continuing mission to provide you with exceptional heart care, we have created designated Provider Care Teams.  These Care Teams include your primary Cardiologist (physician) and Advanced Practice Providers (APPs -  Physician Assistants and Nurse Practitioners) who all work together to provide you with the care you need, when you need it.  We recommend signing up for the patient portal called "MyChart".  Sign up information is provided on this After Visit Summary.  MyChart is used to connect with patients for Virtual Visits (Telemedicine).  Patients are able to view lab/test results, encounter notes, upcoming appointments, etc.  Non-urgent messages can be sent to your provider as well.   To learn more about what you can do with MyChart, go to [email or web address].    Your next appointment:   3 month(s) - lipid clinic  The format for your next appointment:   In Person or Virtual  Provider:   K. Mali Hilty, MD   Other Instructions

## 2020-02-27 NOTE — Telephone Encounter (Signed)
PA for repatha sureclick submitted via CMM (Key: [id number])

## 2020-02-28 NOTE — Telephone Encounter (Signed)
Patient has not responded. Closing note.

## 2020-03-14 NOTE — Telephone Encounter (Addendum)
Spoke with patient's insurance plan [phone] phone and they do not know if medication is approved, states need to be ran with insurance at pharmacy to determine if PA is needed  Spoke with [facility] and they initially said patient did not have drug coverage, had been using a discount card. Provided them with insurance info we have on file and they ran [facility] and notified me that a PA is required and should be submitted via [email or web address] Scientist, forensic Telephone or Text [phone]

## 2020-03-20 NOTE — Telephone Encounter (Signed)
Left message for pharmacy concierge services to see if PA form can be faxed to office Was unable to speak to live representative, could only leave a message

## 2020-03-23 ENCOUNTER — Other Ambulatory Visit: Payer: Self-pay | Admitting: Internal Medicine

## 2020-03-23 DIAGNOSIS — I10 Essential (primary) hypertension: Secondary | ICD-10-CM

## 2020-03-24 NOTE — Telephone Encounter (Signed)
Left message for pharmacy concierge services regarding prior auth status/form as nothing was received after 03/20/20 phone call/message

## 2020-04-02 NOTE — Telephone Encounter (Signed)
Was able to contact live representative from [employer] and was informed to submit request for medication assistance via [email or web address]  Asked rep for validity of this website, as when previously notified of the website and when it was accessed, it was not a website I, nor anyone I work with, had ever accessed and I did not want to submit patient information there if it was not credible. Insurance rep assured me this is a credible website that is utilized by her insurance plan  Submitted patient info, prescribing physician info, drug info via this portal - will await reply/fax

## 2020-04-02 NOTE — Telephone Encounter (Signed)
Have not yet received a prior authorization form from insurance company after requesting via voicemail.   Attempted to reach patient to discuss her insurance plan and if she has a different contact and left message (also sent her a MyChart message)

## 2020-04-04 NOTE — Telephone Encounter (Signed)
Patient replied via MyChart message that she has been approved

## 2020-05-28 ENCOUNTER — Telehealth: Payer: PRIVATE HEALTH INSURANCE | Admitting: Internal Medicine

## 2020-06-30 ENCOUNTER — Telehealth (INDEPENDENT_AMBULATORY_CARE_PROVIDER_SITE_OTHER): Payer: Self-pay | Admitting: Internal Medicine

## 2020-06-30 ENCOUNTER — Encounter: Payer: Self-pay | Admitting: Internal Medicine

## 2020-06-30 VITALS — BP 136/70 | Wt 265.0 lb

## 2020-06-30 DIAGNOSIS — I10 Essential (primary) hypertension: Secondary | ICD-10-CM

## 2020-06-30 DIAGNOSIS — E785 Hyperlipidemia, unspecified: Secondary | ICD-10-CM

## 2020-06-30 DIAGNOSIS — I2510 Atherosclerotic heart disease of native coronary artery without angina pectoris: Secondary | ICD-10-CM

## 2020-06-30 NOTE — Progress Notes (Signed)
Virtual Visit via Telephone Note   This visit type was conducted due to national recommendations for restrictions regarding the COVID-19 Pandemic (e.g. social distancing) in an effort to limit this patient's exposure and mitigate transmission in our community.  Due to her co-morbid illnesses, this patient is at least at moderate risk for complications without adequate follow up.  This format is felt to be most appropriate for this patient at this time.  The patient did not have access to video technology/had technical difficulties with video requiring transitioning to audio format only (telephone).  All issues noted in this document were discussed and addressed.  No physical exam could be performed with this format.  Please refer to the patient's chart for her  consent to telehealth for [facility].   Date:  06/30/2020   ID:  STEVE GREGG, DOB March 09, 1966, MRN [id number] The patient was identified using 2 identifiers.  Evaluation Performed:  Follow-Up Visit  Patient Location:  [place] [address]  Provider location:   [address], [place], [place] [address]  PCP:  Janith Lima, MD  Cardiologist:  No primary care provider on file. Electrophysiologist:  None   Chief Complaint:  Manage dyslipidemia  History of Present Illness:    Jacqueline Ferrell is a 54 y.o. female who presents via audio/video conferencing for a telehealth visit today.  This is a 54 year old female patient who has a history of coronary artery disease with prior DES to the LAD in 2012.  She previously followed with Dr. Marlou Porch but was lost to follow-up and recently reestablished care with Dr. Percival Spanish.  She also had morbid obesity but had bariatric surgery and made dietary changes and ultimately lost 100 pounds.  Overall she says she feels well denies any anginal symptoms.  Previously she had been on high-dose atorvastatin 80 mg daily but did not reach a target LDL less than 70.  She was  subsequently was started on Repatha 140 mg every 2 weeks with marked improvement in her lipids and an LDL cholesterol of 77 approximately 2 years ago.  Unfortunately she ran out of the medication due to loss of follow-up and her LDL has now rebounded to 151.  She reported no significant side effects on Repatha and sounds like she was initially getting some support from [place].  06/30/2020  Jacqueline Ferrell is seen today via telephone follow-up.  We were able to successfully reestablish her Repatha.  She is able to get this at an affordable cost.  She has been using it now for over 2 months without any side effects.  Unfortunately we have not had repeat labs to reassess.  I would like to see if we can get her LDL below 70 and she may need some additional therapy to achieve this.  The patient does not have symptoms concerning for COVID-19 infection (fever, chills, cough, or new SHORTNESS OF BREATH).    Prior CV studies:   The following studies were reviewed today:  Chart reviewed, labs  PMHx:  Past Medical History:  Diagnosis Date  . Arthritis   . Bulging lumbar disc   . Coronary artery disease   . Hyperlipidemia   . Hypertension   . Nephrolithiasis   . Obese   . Poor compliance with medication     Past Surgical History:  Procedure Laterality Date  . ABDOMINAL HYSTERECTOMY  2009  . BARIATRIC SURGERY    . CORONARY ANGIOPLASTY WITH STENT PLACEMENT  FAMHx:  Family History  Problem Relation Age of Onset  . Heart attack Father 33       MI  . Heart disease Father   . Hypertension Mother   . Breast cancer Mother   . Hyperlipidemia Sister   . Hypertension Brother   . Hypertension Brother   . Hypertension Other   . Hyperlipidemia Other   . Heart attack Cousin        5 first cousins with MI's in their 59s  . Diabetes Daughter   . Breast cancer Paternal Aunt   . Breast cancer Maternal Grandmother   . Breast cancer Paternal Grandmother   . Coronary artery disease Neg Hx   .  Stroke Neg Hx   . Alcohol abuse Neg Hx   . Cancer Neg Hx   . Early death Neg Hx     SOCHx:   reports that she has never smoked. She has never used smokeless tobacco. She reports current alcohol use. She reports that she does not use drugs.  ALLERGIES:  Allergies  Allergen Reactions  . Lisinopril Other (See Comments)    REACTION: Tongue swelling    MEDS:  Current Meds  Medication Sig  . acetaminophen (TYLENOL) 500 MG tablet Take 1,000 mg by mouth every 12 (twelve) hours as needed for moderate pain.  Marland Kitchen aspirin 81 MG EC tablet TAKE 1 TABLET BY MOUTH EVERY DAY  . atorvastatin (LIPITOR) 80 MG tablet TAKE 1 TABLET BY MOUTH EVERY DAY  . Cholecalciferol 50 MCG (2000 UT) TABS Take 1 tablet (2,000 Units total) by mouth daily.  . Evolocumab (REPATHA SURECLICK) 628 MG/ML SOAJ Inject 1 Dose into the skin every 14 (fourteen) days.  Marland Kitchen gabapentin (NEURONTIN) 100 MG capsule Take 100 mg by mouth daily.  . Magnesium Oxide 250 MG TABS Take 1 tablet (250 mg total) by mouth at bedtime.  Marland Kitchen tiZANidine (ZANAFLEX) 4 MG tablet Take 4 mg by mouth at bedtime.  . triamterene-hydrochlorothiazide (DYAZIDE) 37.5-25 MG capsule TAKE 1 EACH (1 CAPSULE TOTAL) BY MOUTH DAILY.     ROS: Pertinent items noted in HPI and remainder of comprehensive ROS otherwise negative.  Labs/Other Tests and Data Reviewed:    Recent Labs: 12/27/2019: BUN 18; Creatinine, Ser 0.84; Hemoglobin 14.6; Magnesium 1.8; Platelets 183.0; Potassium 3.7; Sodium 138; TSH 1.08   Recent Lipid Panel Lab Results  Component Value Date/Time   CHOL 211 (H) 12/27/2019 09:44 AM   CHOL 129 12/07/2017 09:43 AM   TRIG 59.0 12/27/2019 09:44 AM   TRIG 81 07/28/2010 04:30 PM   HDL 48.00 12/27/2019 09:44 AM   HDL 41 12/07/2017 09:43 AM   CHOLHDL 4 12/27/2019 09:44 AM   LDLCALC 151 (H) 12/27/2019 09:44 AM   LDLCALC 77 12/07/2017 09:43 AM    Wt Readings from Last 3 Encounters:  06/30/20 265 lb (120.2 kg)  02/27/20 255 lb (115.7 kg)  01/07/20 260 lb  9.6 oz (118.2 kg)     Exam:    Vital Signs:  BP 136/70   Wt 265 lb (120.2 kg)   LMP 02/10/2008   BMI 41.50 kg/m    Exam not performed due to telephone visit  ASSESSMENT & PLAN:    1. Mixed dyslipidemia, possible familial hyperlipidemia-goal LDL less than 70 2. CAD with DES to the LAD in 2012 3. Morbid obesity with recent 100 pound weight loss status post bariatric surgery 4. Essential hypertension  Jacqueline Ferrell has reestablished therapy on Repatha for the last 2 months.  We are targeting  an LDL less than 70.  Plan to get repeat labs hopefully this week and I may further titrate her medications if necessary.  Otherwise follow-up with me annually or sooner as necessary and with Dr. Percival Spanish for non-lipid needs.  COVID-19 Education: The signs and symptoms of COVID-19 were discussed with the patient and how to seek care for testing (follow up with PCP or arrange E-visit).  The importance of social distancing was discussed today.  Patient Risk:   After full review of this patients clinical status, I feel that they are at least moderate risk at this time.  Time:   Today, I have spent 15 minutes with the patient with telehealth technology discussing dyslipidemia, possible familial hyperlipidemia, coronary artery disease, PCSK9 inhibitors.     Medication Adjustments/Labs and Tests Ordered: Current medicines are reviewed at length with the patient today.  Concerns regarding medicines are outlined above.   Tests Ordered: No orders of the defined types were placed in this encounter.   Medication Changes: No orders of the defined types were placed in this encounter.   Disposition:  in 1 year(s)  Pixie Casino, MD, [facility], [place] Director of the Advanced Lipid Disorders &  Cardiovascular Risk Reduction Clinic Diplomate of the American Board of Clinical Lipidology Attending Cardiologist  Direct Dial: [phone]  Fax: [phone]  Website:   [email or web address]  Pixie Casino, MD  06/30/2020 8:57 AM

## 2020-06-30 NOTE — Patient Instructions (Signed)
Medication Instructions:  Your physician recommends that you continue on your current medications as directed. Please refer to the Current Medication list given to you today.  *If you need a refill on your cardiac medications before your next appointment, please call your pharmacy*   Lab Work: FASTING lab work to check cholesterol -- you can use the [facility] in Dr. Lysbeth Penner office @ [place] -- no appointment needed -- open M-F from 8am-4:30pm (closed ~ 12:45-1:45pm for lunch) -- office is closed 12/24   If you have labs (blood work) drawn today and your tests are completely normal, you will receive your results only by: Marland Kitchen MyChart Message (if you have MyChart) OR . A paper copy in the mail If you have any lab test that is abnormal or we need to change your treatment, we will call you to review the results.   Testing/Procedures: NONE   Follow-Up: At [facility], you and your health needs are our priority.  As part of our continuing mission to provide you with exceptional heart care, we have created designated Provider Care Teams.  These Care Teams include your primary Cardiologist (physician) and Advanced Practice Providers (APPs -  Physician Assistants and Nurse Practitioners) who all work together to provide you with the care you need, when you need it.  We recommend signing up for the patient portal called "MyChart".  Sign up information is provided on this After Visit Summary.  MyChart is used to connect with patients for Virtual Visits (Telemedicine).  Patients are able to view lab/test results, encounter notes, upcoming appointments, etc.  Non-urgent messages can be sent to your provider as well.   To learn more about what you can do with MyChart, go to [email or web address].    Your next appointment:   12 month(s)  - lipid clinic  The format for your next appointment:   In Person  Provider:   K. Mali Hilty, MD   Other Instructions

## 2020-09-08 ENCOUNTER — Other Ambulatory Visit: Payer: Self-pay

## 2020-09-08 ENCOUNTER — Ambulatory Visit: Payer: PRIVATE HEALTH INSURANCE | Admitting: Internal Medicine

## 2020-09-08 ENCOUNTER — Encounter: Payer: Self-pay | Admitting: Internal Medicine

## 2020-09-08 VITALS — BP 152/98 | HR 62 | Temp 98.4°F | Ht 67.0 in

## 2020-09-08 DIAGNOSIS — I10 Essential (primary) hypertension: Secondary | ICD-10-CM

## 2020-09-08 DIAGNOSIS — I2510 Atherosclerotic heart disease of native coronary artery without angina pectoris: Secondary | ICD-10-CM

## 2020-09-08 DIAGNOSIS — G4762 Sleep related leg cramps: Secondary | ICD-10-CM

## 2020-09-08 DIAGNOSIS — Z124 Encounter for screening for malignant neoplasm of cervix: Secondary | ICD-10-CM

## 2020-09-08 DIAGNOSIS — M48062 Spinal stenosis, lumbar region with neurogenic claudication: Secondary | ICD-10-CM

## 2020-09-08 DIAGNOSIS — E785 Hyperlipidemia, unspecified: Secondary | ICD-10-CM | POA: Diagnosis not present

## 2020-09-08 DIAGNOSIS — Z1211 Encounter for screening for malignant neoplasm of colon: Secondary | ICD-10-CM

## 2020-09-08 DIAGNOSIS — N644 Mastodynia: Secondary | ICD-10-CM | POA: Insufficient documentation

## 2020-09-08 DIAGNOSIS — B351 Tinea unguium: Secondary | ICD-10-CM | POA: Insufficient documentation

## 2020-09-08 LAB — LIPID PANEL
Cholesterol: 246 mg/dL — ABNORMAL HIGH (ref 0–200)
HDL: 51.8 mg/dL (ref >39.00)
LDL Cholesterol: 180 mg/dL — ABNORMAL HIGH (ref 0–99)
NonHDL: 194.14
Total CHOL/HDL Ratio: 5
Triglycerides: 73 mg/dL (ref 0.0–149.0)
VLDL: 14.6 mg/dL (ref 0.0–40.0)

## 2020-09-08 LAB — BASIC METABOLIC PANEL
BUN: 13 mg/dL (ref 6–23)
CO2: 27 mEq/L (ref 19–32)
Calcium: 9.9 mg/dL (ref 8.4–10.5)
Chloride: 106 mEq/L (ref 96–112)
Creatinine, Ser: 0.77 mg/dL (ref 0.40–1.20)
GFR: 87.99 mL/min (ref >60.00)
Glucose, Bld: 76 mg/dL (ref 70–99)
Potassium: 3.6 mEq/L (ref 3.5–5.1)
Sodium: 141 mEq/L (ref 135–145)

## 2020-09-08 LAB — HEPATIC FUNCTION PANEL
ALT: 13 U/L (ref 0–35)
AST: 18 U/L (ref 0–37)
Albumin: 4.3 g/dL (ref 3.5–5.2)
Alkaline Phosphatase: 67 U/L (ref 39–117)
Bilirubin, Direct: 0.1 mg/dL (ref 0.0–0.3)
Total Bilirubin: 0.6 mg/dL (ref 0.2–1.2)
Total Protein: 7.8 g/dL (ref 6.0–8.3)

## 2020-09-08 LAB — CK: Total CK: 182 U/L — ABNORMAL HIGH (ref 7–177)

## 2020-09-08 LAB — MAGNESIUM: Magnesium: 1.9 mg/dL (ref 1.5–2.5)

## 2020-09-08 MED ORDER — TIZANIDINE HCL 4 MG PO TABS: 4.0000 mg | ORAL_TABLET | Freq: Every day | ORAL | 1 refills | Status: DC

## 2020-09-08 MED ORDER — GABAPENTIN 100 MG PO CAPS: 100.0000 mg | ORAL_CAPSULE | Freq: Every day | ORAL | 1 refills | Status: DC

## 2020-09-08 NOTE — Progress Notes (Signed)
Subjective:  Patient ID: Jacqueline Ferrell, female    DOB: December 26, 1965  Age: 55 y.o. MRN: [id number]  CC: Hypertension  This visit occurred during the SARS-CoV-2 public health emergency.  Safety protocols were in place, including screening questions prior to the visit, additional usage of staff PPE, and extensive cleaning of exam room while observing appropriate contact time as indicated for disinfecting solutions.    HPI Jacqueline Ferrell presents for f/up -   According to prescription refills she would have run out of the statin and her antihypertensive about 2 months ago.  She has had a mild, diffuse headache recently.  She is not been monitoring her blood pressure.  She denies blurred vision, chest pain, shortness of breath, or worsening edema.  She complains of abnormal and uncomfortable toenails.  She tells me she previously saw a podiatrist and a nail specimen was positive for fungal infection.  She tells me she took Lamisil for about a month and tolerated it well.  She did not go back to the podiatrist so she did not complete the course of Lamisil.  She complains of a several month history of discomfort in her breast, more on the right than the left.  Her self-examinations have been normal.  Outpatient Medications Prior to Visit  Medication Sig Dispense Refill  . acetaminophen (TYLENOL) 500 MG tablet Take 1,000 mg by mouth every 12 (twelve) hours as needed for moderate pain.    Marland Kitchen aspirin 81 MG EC tablet TAKE 1 TABLET BY MOUTH EVERY DAY 90 tablet 1  . Cholecalciferol 50 MCG (2000 UT) TABS Take 1 tablet (2,000 Units total) by mouth daily. 90 tablet 3  . Evolocumab (REPATHA SURECLICK) 629 MG/ML SOAJ Inject 1 Dose into the skin every 14 (fourteen) days. 6 mL 3  . Magnesium Oxide 250 MG TABS Take 1 tablet (250 mg total) by mouth at bedtime. 90 tablet 1  . atorvastatin (LIPITOR) 80 MG tablet TAKE 1 TABLET BY MOUTH EVERY DAY 90 tablet 1  . gabapentin (NEURONTIN) 100 MG capsule Take 100 mg by mouth  daily.    Marland Kitchen tiZANidine (ZANAFLEX) 4 MG tablet Take 4 mg by mouth at bedtime.    . triamterene-hydrochlorothiazide (DYAZIDE) 37.5-25 MG capsule TAKE 1 EACH (1 CAPSULE TOTAL) BY MOUTH DAILY. 90 capsule 0   No facility-administered medications prior to visit.    ROS Review of Systems  Constitutional: Positive for unexpected weight change (wt gain). Negative for chills, diaphoresis and fatigue.  HENT: Negative.   Eyes: Negative.   Respiratory: Negative for cough, chest tightness, shortness of breath and wheezing.   Cardiovascular: Negative for chest pain, palpitations and leg swelling.  Gastrointestinal: Negative for abdominal pain, constipation, diarrhea, nausea and vomiting.  Endocrine: Negative.   Genitourinary: Negative.  Negative for difficulty urinating.  Musculoskeletal: Positive for arthralgias and back pain. Negative for myalgias and neck pain.       ++ nocturnal leg cramps  Skin: Negative.   Neurological: Positive for headaches. Negative for dizziness, weakness, light-headedness and numbness.  Hematological: Negative for adenopathy. Does not bruise/bleed easily.  Psychiatric/Behavioral: Negative.     Objective:  BP (!) 152/98   Pulse 62   Temp 98.4 F (36.9 C) (Oral)   Ht 5\' 7"  (1.702 m)   LMP 02/10/2008   SpO2 98%   BMI 41.50 kg/m   BP Readings from Last 3 Encounters:  09/08/20 (!) 152/98  06/30/20 136/70  02/27/20 132/76    Wt Readings from Last 3 Encounters:  06/30/20 265 lb (120.2 kg)  02/27/20 255 lb (115.7 kg)  01/07/20 260 lb 9.6 oz (118.2 kg)    Physical Exam Vitals reviewed.  HENT:     Nose: Nose normal.     Mouth/Throat:     Mouth: Mucous membranes are moist.  Eyes:     General: No scleral icterus.    Conjunctiva/sclera: Conjunctivae normal.  Cardiovascular:     Rate and Rhythm: Normal rate and regular rhythm.     Pulses:          Dorsalis pedis pulses are 1+ on the right side and 1+ on the left side.       Posterior tibial pulses are 1+ on  the right side and 1+ on the left side.     Heart sounds: No murmur heard. No gallop.   Pulmonary:     Effort: Pulmonary effort is normal.     Breath sounds: No stridor. No wheezing, rhonchi or rales.  Abdominal:     General: Abdomen is protuberant. Bowel sounds are normal. There is no distension.     Palpations: There is no hepatomegaly, splenomegaly or mass.     Tenderness: There is no abdominal tenderness.  Musculoskeletal:        General: Normal range of motion.     Cervical back: Neck supple.     Right lower leg: Edema (trace pitting) present.     Left lower leg: Edema (trace pitting) present.  Feet:     Right foot:     Skin integrity: Skin integrity normal.     Toenail Condition: Right toenails are abnormally thick. Fungal disease present.    Left foot:     Skin integrity: Skin integrity normal.     Toenail Condition: Left toenails are abnormally thick. Fungal disease present. Lymphadenopathy:     Cervical: No cervical adenopathy.  Skin:    General: Skin is warm and dry.     Coloration: Skin is not pale.  Neurological:     General: No focal deficit present.     Mental Status: She is alert and oriented to person, place, and time. Mental status is at baseline.  Psychiatric:        Mood and Affect: Mood normal.        Behavior: Behavior normal.     Lab Results  Component Value Date   WBC 6.8 12/27/2019   HGB 14.6 12/27/2019   HCT 43.0 12/27/2019   PLT 183.0 12/27/2019   GLUCOSE 76 09/08/2020   CHOL 246 (H) 09/08/2020   TRIG 73.0 09/08/2020   HDL 51.80 09/08/2020   LDLCALC 180 (H) 09/08/2020   ALT 13 09/08/2020   AST 18 09/08/2020   NA 141 09/08/2020   K 3.6 09/08/2020   CL 106 09/08/2020   CREATININE 0.77 09/08/2020   BUN 13 09/08/2020   CO2 27 09/08/2020   TSH 1.08 12/27/2019   INR 1.1 (H) 09/11/2010   HGBA1C 5.5 01/11/2019    MM DIGITAL SCREENING BILATERAL  Result Date: 01/15/2020 CLINICAL DATA:  Screening. EXAM: DIGITAL SCREENING BILATERAL MAMMOGRAM  WITH CAD COMPARISON:  Previous exam(s). ACR Breast Density Category c: The breast tissue is heterogeneously dense, which may obscure small masses. FINDINGS: There are no findings suspicious for malignancy. Images were processed with CAD. IMPRESSION: No mammographic evidence of malignancy. A result letter of this screening mammogram will be mailed directly to the patient. RECOMMENDATION: Screening mammogram in one year. (Code:SM-B-01Y) BI-RADS CATEGORY  1: Negative. Electronically Signed   By:  Evangeline Dakin M.D.   On: 01/15/2020 12:26    Assessment & Plan:   Jacqueline Ferrell was seen today for hypertension.  Diagnoses and all orders for this visit:  Nocturnal leg cramps- Labs are negative for secondary causes or complications. -     Discontinue: gabapentin (NEURONTIN) 100 MG capsule; Take 1 capsule (100 mg total) by mouth at bedtime. -     Discontinue: tiZANidine (ZANAFLEX) 4 MG tablet; Take 1 tablet (4 mg total) by mouth at bedtime. -     Basic metabolic panel; Future -     tiZANidine (ZANAFLEX) 4 MG tablet; Take 1 tablet (4 mg total) by mouth at bedtime. -     gabapentin (NEURONTIN) 100 MG capsule; Take 1 capsule (100 mg total) by mouth at bedtime. -     CK; Future -     CK -     Basic metabolic panel  Hyperlipidemia with target LDL less than 70- She has not achieved her LDL goal due to noncompliance.  I have asked her to be more compliant with the statin. -     Lipid panel; Future -     Hepatic function panel; Future -     Hepatic function panel -     Lipid panel -     atorvastatin (LIPITOR) 80 MG tablet; TAKE 1 TABLET BY MOUTH EVERY DAY  Primary hypertension- Her blood pressure is not adequately well controlled and she is symptomatic.  I have asked her to restart Dyazide and to add nebivolol.  Will screen her for secondary causes of hypertension. -     Basic metabolic panel; Future -     Aldosterone + renin activity w/ ratio; Future -     Magnesium; Future -     Magnesium -     Aldosterone  + renin activity w/ ratio -     Basic metabolic panel -     triamterene-hydrochlorothiazide (DYAZIDE) 37.5-25 MG capsule; Take 1 each (1 capsule total) by mouth daily. -     nebivolol (BYSTOLIC) 5 MG tablet; Take 1 tablet (5 mg total) by mouth daily.  Spinal stenosis, lumbar region, with neurogenic claudication -     tiZANidine (ZANAFLEX) 4 MG tablet; Take 1 tablet (4 mg total) by mouth at bedtime. -     gabapentin (NEURONTIN) 100 MG capsule; Take 1 capsule (100 mg total) by mouth at bedtime.  Onychomycosis of toenail -     terbinafine (LAMISIL) 250 MG tablet; Take 1 tablet (250 mg total) by mouth daily.  Screening for cervical cancer -     Ambulatory referral to Gynecology  Bilateral mastodynia -     MM DIAG BREAST TOMO BILATERAL; Future  Colon cancer screening -     Cologuard  Atherosclerosis of native coronary artery of native heart without angina pectoris -     atorvastatin (LIPITOR) 80 MG tablet; TAKE 1 TABLET BY MOUTH EVERY DAY  Essential hypertension   I am having Jacqueline Ferrell start on terbinafine and nebivolol. I am also having her maintain her acetaminophen, aspirin, Magnesium Oxide, Cholecalciferol, Repatha SureClick, tiZANidine, gabapentin, atorvastatin, and triamterene-hydrochlorothiazide.  Meds ordered this encounter  Medications  . DISCONTD: gabapentin (NEURONTIN) 100 MG capsule    Sig: Take 1 capsule (100 mg total) by mouth at bedtime.    Dispense:  90 capsule    Refill:  1  . DISCONTD: tiZANidine (ZANAFLEX) 4 MG tablet    Sig: Take 1 tablet (4 mg total) by mouth at bedtime.  Dispense:  90 tablet    Refill:  1  . tiZANidine (ZANAFLEX) 4 MG tablet    Sig: Take 1 tablet (4 mg total) by mouth at bedtime.    Dispense:  90 tablet    Refill:  1  . gabapentin (NEURONTIN) 100 MG capsule    Sig: Take 1 capsule (100 mg total) by mouth at bedtime.    Dispense:  90 capsule    Refill:  1  . terbinafine (LAMISIL) 250 MG tablet    Sig: Take 1 tablet (250 mg total)  by mouth daily.    Dispense:  90 tablet    Refill:  0  . atorvastatin (LIPITOR) 80 MG tablet    Sig: TAKE 1 TABLET BY MOUTH EVERY DAY    Dispense:  90 tablet    Refill:  1  . triamterene-hydrochlorothiazide (DYAZIDE) 37.5-25 MG capsule    Sig: Take 1 each (1 capsule total) by mouth daily.    Dispense:  90 capsule    Refill:  0  . nebivolol (BYSTOLIC) 5 MG tablet    Sig: Take 1 tablet (5 mg total) by mouth daily.    Dispense:  90 tablet    Refill:  0     Follow-up: Return in about 6 weeks (around 10/20/2020).  Scarlette Calico, MD

## 2020-09-08 NOTE — Patient Instructions (Signed)

## 2020-09-09 MED ORDER — TRIAMTERENE-HCTZ 37.5-25 MG PO CAPS
1.0000 | ORAL_CAPSULE | Freq: Every day | ORAL | 0 refills | Status: DC
Start: 2020-09-09 — End: 2020-11-01

## 2020-09-09 MED ORDER — ATORVASTATIN CALCIUM 80 MG PO TABS: ORAL_TABLET | ORAL | 1 refills | Status: DC

## 2020-09-09 MED ORDER — TERBINAFINE HCL 250 MG PO TABS: 250.0000 mg | ORAL_TABLET | Freq: Every day | ORAL | 0 refills | Status: AC

## 2020-09-09 MED ORDER — NEBIVOLOL HCL 5 MG PO TABS
5.0000 mg | ORAL_TABLET | Freq: Every day | ORAL | 0 refills | Status: DC
Start: 2020-09-09 — End: 2020-11-01

## 2020-09-15 LAB — ALDOSTERONE + RENIN ACTIVITY W/ RATIO
ALDO / PRA Ratio: 42.9 Ratio — ABNORMAL HIGH (ref 0.9–28.9)
Aldosterone: 6 ng/dL
Renin Activity: 0.14 ng/mL/h — ABNORMAL LOW (ref 0.25–5.82)

## 2020-09-18 ENCOUNTER — Encounter: Payer: Self-pay | Admitting: Nurse Practitioner

## 2020-09-18 ENCOUNTER — Other Ambulatory Visit: Payer: Self-pay | Admitting: Internal Medicine

## 2020-09-18 ENCOUNTER — Ambulatory Visit: Payer: PRIVATE HEALTH INSURANCE | Admitting: Nurse Practitioner

## 2020-09-18 ENCOUNTER — Other Ambulatory Visit: Payer: Self-pay

## 2020-09-18 ENCOUNTER — Telehealth: Payer: Self-pay | Admitting: *Deleted

## 2020-09-18 VITALS — BP 118/78 | HR 70 | Resp 16 | Ht 65.0 in

## 2020-09-18 DIAGNOSIS — Z01419 Encounter for gynecological examination (general) (routine) without abnormal findings: Secondary | ICD-10-CM

## 2020-09-18 DIAGNOSIS — N63 Unspecified lump in unspecified breast: Secondary | ICD-10-CM

## 2020-09-18 DIAGNOSIS — N6321 Unspecified lump in the left breast, upper outer quadrant: Secondary | ICD-10-CM | POA: Diagnosis not present

## 2020-09-18 DIAGNOSIS — N644 Mastodynia: Secondary | ICD-10-CM

## 2020-09-18 LAB — HM PAP SMEAR

## 2020-09-18 NOTE — Progress Notes (Signed)
55 y.o. [id number] Divorced Black or [place] American female here for annual exam.     Saw PCP 09/08/2020. Reported Bilateral breast pain. Diagnostic Mammogram scheduled.  Pain started in breast x 2-2.5 months. She first noticed under left arm was sore and right breast became swollen. Both breast are more sensitive. Has stopped wearing under wire bras, new bra with wire, doesn't support as much but feels better.  Noticed that she gained weight recently. Did not weigh on our scale today R/T balance issues. 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 Works a lot to support mother who lives with her and also her brother.   Hx: hyst for fibroids/heavy bleeding, ovaries remain. Occasional hot flash x 2 years Last sex 08/2019  Patient's last menstrual period was 02/10/2008.          Sexually active: No.  The current method of family planning is status post hysterectomy.    Exercising: No.  exercise Smoker:  no  Health Maintenance: Pap:  Many yrs ago History of abnormal Pap:  Yes, in her 60's MMG:  01-15-2020 category c density birads 1:neg Colonoscopy:  None, but cologard arrived at her house to complete BMD:   none TDaP:  2017 Gardasil:   n/a Covid-19:2  Pfizer & moderna booster Hep C testing: neg in 2008 Screening Labs: with PCP   reports that she has never smoked. She has never used smokeless tobacco. She reports current alcohol use. She reports that she does not use drugs.  Past Medical History:  Diagnosis Date  . Abnormal Pap smear of cervix    in her 20's  . Anemia   . Arthritis   . Bulging lumbar disc   . Coronary artery disease   . Fibroid   . Hyperlipidemia   . Hypertension   . Migraines    in the past  . Nephrolithiasis   . Obese   . PCOS (polycystic ovarian syndrome)   . Poor compliance with medication     Past Surgical History:  Procedure Laterality Date  . ABDOMINAL HYSTERECTOMY  2009  . BARIATRIC  SURGERY    . CORONARY ANGIOPLASTY WITH STENT PLACEMENT    . feet surgery     corrective surgery as a child  . TUBAL LIGATION      Current Outpatient Medications  Medication Sig Dispense Refill  . acetaminophen (TYLENOL) 500 MG tablet Take 1,000 mg by mouth every 12 (twelve) hours as needed for moderate pain.    Marland Kitchen aspirin 81 MG EC tablet TAKE 1 TABLET BY MOUTH EVERY DAY 90 tablet 1  . atorvastatin (LIPITOR) 80 MG tablet TAKE 1 TABLET BY MOUTH EVERY DAY 90 tablet 1  . Cholecalciferol 50 MCG (2000 UT) TABS Take 1 tablet (2,000 Units total) by mouth daily. 90 tablet 3  . Evolocumab (REPATHA SURECLICK) 989 MG/ML SOAJ Inject 1 Dose into the skin every 14 (fourteen) days. 6 mL 3  . gabapentin (NEURONTIN) 100 MG capsule Take 1 capsule (100 mg total) by mouth at bedtime. 90 capsule 1  . Magnesium Oxide 250 MG TABS Take 1 tablet (250 mg total) by mouth at bedtime. 90 tablet 1  . nebivolol (BYSTOLIC) 5 MG tablet Take 1 tablet (5 mg total) by mouth daily. 90 tablet 0  . terbinafine (LAMISIL) 250 MG tablet Take 1 tablet (250 mg total) by mouth daily. 90 tablet 0  . tiZANidine (ZANAFLEX)  4 MG tablet Take 1 tablet (4 mg total) by mouth at bedtime. 90 tablet 1  . triamterene-hydrochlorothiazide (DYAZIDE) 37.5-25 MG capsule Take 1 each (1 capsule total) by mouth daily. 90 capsule 0   No current facility-administered medications for this visit.    Family History  Problem Relation Age of Onset  . Heart attack Father        63's, 40 & age 6  . Heart disease Father   . Diabetes Father   . Hypertension Father   . Hypertension Mother   . Breast cancer Mother   . Hypertension Brother   . Hypertension Brother   . Heart attack Cousin        5 first cousins with MI's in their 56s  . Diabetes Daughter   . Hypertension Daughter   . Breast cancer Paternal Aunt   . Breast cancer Maternal Grandmother   . Other Paternal Grandmother        female cancer  . Stroke Maternal Grandfather   . Colon cancer  Paternal Grandfather     Review of Systems  Constitutional: Negative.   HENT: Negative.   Eyes: Negative.   Respiratory: Negative.   Cardiovascular: Negative.   Gastrointestinal: Negative.   Endocrine: Negative.   Genitourinary: Negative.   Musculoskeletal: Negative.   Skin:       Swollen, tender breast for 37mths  Allergic/Immunologic: Negative.   Neurological: Negative.   Hematological: Negative.   Psychiatric/Behavioral: Negative.     Exam:   BP 118/78   Pulse 70   Resp 16   Ht 5\' 5"  (1.651 m)   LMP 02/10/2008   BMI 44.10 kg/m   Height: 5\' 5"  (165.1 cm)  General appearance: alert, cooperative and appears stated age, no acute distress, limited mobility r/t back pain Head: Normocephalic, without obvious abnormality Neck: no adenopathy, thyroid normal to inspection and palpation Lungs: clear to auscultation bilaterally Breasts: Right breast tender but no palpable mass, Left breast with cystic type mass (smooth, round ) 1-2 o'clock 5 cm from areola, identifed area of concern but pt would not allow deep palpation r/t tenderness Heart: regular rate and rhythm Abdomen: soft, non-tender; no masses,  no organomegaly Extremities: extremities normal, no edema Skin: No rashes or lesions Lymph nodes: Cervical, supraclavicular, and axillary nodes normal. No abnormal inguinal nodes palpated Neurologic: Grossly normal   Pelvic: External genitalia:  no lesions              Urethra:  normal appearing urethra with no masses, tenderness or lesions              Bartholins and Skenes: normal                 Vagina: normal appearing vagina, appropriate for age, normal appearing discharge, no lesions              Bimanual Exam:   Uterus:  uterus absent              Adnexa: no mass, fullness, tenderness and limited abilty to assess r/t body habitus and pt position                 Joy, CMA Chaperone was present for exam.  A:  Well Woman with normal exam  Breast pain, probable breast  cyst Left Breast  P:   Pap :n/a  Mammogram: Diagnostic mammogram previously ordered by Dr. Ronnald Ramp. Will call the breast center to ask to add assessment of breast mass identified  Labs:  with PCP  Medications: no new

## 2020-09-18 NOTE — Telephone Encounter (Signed)
Error wrong patient

## 2020-09-18 NOTE — Patient Instructions (Addendum)
Breast Tenderness Breast tenderness is a common problem for women of all ages, but may also occur in men. Breast tenderness may range from mild discomfort to severe pain. In women, the pain usually comes and goes with the menstrual cycle, but it can also be constant. Breast tenderness has many possible causes, including hormone changes, infections, and taking certain medicines. You may have tests, such as a mammogram or an ultrasound, to check for any unusual findings. Having breast tenderness usually does not mean that you have breast cancer. Follow these instructions at home: Managing pain and discomfort  If directed, put ice to the painful area. To do this: ? Put ice in a plastic bag. ? Place a towel between your skin and the bag. ? Leave the ice on for 20 minutes, 2-3 times a day.  Wear a supportive bra, especially during exercise. You may also want to wear a supportive bra while sleeping if your breasts are very tender.   Medicines  Take over-the-counter and prescription medicines only as told by your health care provider. If the cause of your pain is infection, you may be prescribed an antibiotic medicine.  If you were prescribed an antibiotic, take it as told by your health care provider. Do not stop taking the antibiotic even if you start to feel better. Eating and drinking  Your health care provider may recommend that you lessen the amount of fat in your diet. You can do this by: ? Limiting fried foods. ? Cooking foods using methods such as baking, boiling, grilling, and broiling.  Decrease the amount of caffeine in your diet. Instead, drink more water and choose caffeine-free drinks. General instructions  Keep a log of the days and times when your breasts are most tender.  Ask your health care provider how to do breast exams at home. This will help you notice if you have an unusual growth or lump.  Keep all follow-up visits as told by your health care provider. This is  important.   Contact a health care provider if:  Any part of your breast is hard, red, and hot to the touch. This may be a sign of infection.  You are a woman and: ? Not breastfeeding and you have fluid, especially blood or pus, coming out of your nipples. ? Have a new or painful lump in your breast that remains after your menstrual period ends.  You have a fever.  Your pain does not improve or it gets worse.  Your pain is interfering with your daily activities. Summary  Breast tenderness may range from mild discomfort to severe pain.  Breast tenderness has many possible causes, including hormone changes, infections, and taking certain medicines.  It can be treated with ice, wearing a supportive bra, and medicines.  Make changes to your diet if told to by your health care provider. This information is not intended to replace advice given to you by your health care provider. Make sure you discuss any questions you have with your health care provider. Document Revised: 11/20/2018 Document Reviewed: 11/20/2018 Elsevier Patient Education  2021 [place] Maintenance, Female Adopting a healthy lifestyle and getting preventive care are important in promoting health and wellness. Ask your health care provider about:  The right schedule for you to have regular tests and exams.  Things you can do on your own to prevent diseases and keep yourself healthy. What should I know about diet, weight, and exercise? Eat a healthy diet  Eat a  diet that includes plenty of vegetables, fruits, low-fat dairy products, and lean protein.  Do not eat a lot of foods that are high in solid fats, added sugars, or sodium.   Maintain a healthy weight Body mass index (BMI) is used to identify weight problems. It estimates body fat based on height and weight. Your health care provider can help determine your BMI and help you achieve or maintain a healthy weight. Get regular exercise Get regular  exercise. This is one of the most important things you can do for your health. Most adults should:  Exercise for at least 150 minutes each week. The exercise should increase your heart rate and make you sweat (moderate-intensity exercise).  Do strengthening exercises at least twice a week. This is in addition to the moderate-intensity exercise.  Spend less time sitting. Even light physical activity can be beneficial. Watch cholesterol and blood lipids Have your blood tested for lipids and cholesterol at 54 years of age, then have this test every 5 years. Have your cholesterol levels checked more often if:  Your lipid or cholesterol levels are high.  You are older than 54 years of age.  You are at high risk for heart disease. What should I know about cancer screening? Depending on your health history and family history, you may need to have cancer screening at various ages. This may include screening for:  Breast cancer.  Cervical cancer.  Colorectal cancer.  Skin cancer.  Lung cancer. What should I know about heart disease, diabetes, and high blood pressure? Blood pressure and heart disease  High blood pressure causes heart disease and increases the risk of stroke. This is more likely to develop in people who have high blood pressure readings, are of African descent, or are overweight.  Have your blood pressure checked: ? Every 3-5 years if you are 75-12 years of age. ? Every year if you are 84 years old or older. Diabetes Have regular diabetes screenings. This checks your fasting blood sugar level. Have the screening done:  Once every three years after age 88 if you are at a normal weight and have a low risk for diabetes.  More often and at a younger age if you are overweight or have a high risk for diabetes. What should I know about preventing infection? Hepatitis B If you have a higher risk for hepatitis B, you should be screened for this virus. Talk with your health  care provider to find out if you are at risk for hepatitis B infection. Hepatitis C Testing is recommended for:  Everyone born from [birth year] through 1964.  Anyone with known risk factors for hepatitis C. Sexually transmitted infections (STIs)  Get screened for STIs, including gonorrhea and chlamydia, if: ? You are sexually active and are younger than 54 years of age. ? You are older than 54 years of age and your health care provider tells you that you are at risk for this type of infection. ? Your sexual activity has changed since you were last screened, and you are at increased risk for chlamydia or gonorrhea. Ask your health care provider if you are at risk.  Ask your health care provider about whether you are at high risk for HIV. Your health care provider may recommend a prescription medicine to help prevent HIV infection. If you choose to take medicine to prevent HIV, you should first get tested for HIV. You should then be tested every 3 months for as long as you are taking the  medicine. Pregnancy  If you are about to stop having your period (premenopausal) and you may become pregnant, seek counseling before you get pregnant.  Take 400 to 800 micrograms (mcg) of folic acid every day if you become pregnant.  Ask for birth control (contraception) if you want to prevent pregnancy. Osteoporosis and menopause Osteoporosis is a disease in which the bones lose minerals and strength with aging. This can result in bone fractures. If you are 8 years old or older, or if you are at risk for osteoporosis and fractures, ask your health care provider if you should:  Be screened for bone loss.  Take a calcium or vitamin D supplement to lower your risk of fractures.  Be given hormone replacement therapy (HRT) to treat symptoms of menopause. Follow these instructions at home: Lifestyle  Do not use any products that contain nicotine or tobacco, such as cigarettes, e-cigarettes, and chewing tobacco.  If you need help quitting, ask your health care provider.  Do not use street drugs.  Do not share needles.  Ask your health care provider for help if you need support or information about quitting drugs. Alcohol use  Do not drink alcohol if: ? Your health care provider tells you not to drink. ? You are pregnant, may be pregnant, or are planning to become pregnant.  If you drink alcohol: ? Limit how much you use to 0-1 drink a day. ? Limit intake if you are breastfeeding.  Be aware of how much alcohol is in your drink. In the [place] one drink equals one 12 oz bottle of beer (355 mL), one 5 oz glass of wine (148 mL), or one 1 oz glass of hard liquor (44 mL). General instructions  Schedule regular health, dental, and eye exams.  Stay current with your vaccines.  Tell your health care provider if: ? You often feel depressed. ? You have ever been abused or do not feel safe at home. Summary  Adopting a healthy lifestyle and getting preventive care are important in promoting health and wellness.  Follow your health care provider's instructions about healthy diet, exercising, and getting tested or screened for diseases.  Follow your health care provider's instructions on monitoring your cholesterol and blood pressure. This information is not intended to replace advice given to you by your health care provider. Make sure you discuss any questions you have with your health care provider. Document Revised: 06/21/2018 Document Reviewed: 06/21/2018 Elsevier Patient Education  2021 [employer].

## 2020-10-22 ENCOUNTER — Other Ambulatory Visit: Payer: Self-pay

## 2020-10-23 ENCOUNTER — Encounter: Payer: Self-pay | Admitting: Internal Medicine

## 2020-10-23 ENCOUNTER — Ambulatory Visit: Payer: PRIVATE HEALTH INSURANCE | Admitting: Internal Medicine

## 2020-10-23 VITALS — BP 144/90 | HR 60 | Temp 98.3°F | Resp 16 | Ht 65.0 in

## 2020-10-23 DIAGNOSIS — I2510 Atherosclerotic heart disease of native coronary artery without angina pectoris: Secondary | ICD-10-CM | POA: Diagnosis not present

## 2020-10-23 DIAGNOSIS — I10 Essential (primary) hypertension: Secondary | ICD-10-CM

## 2020-10-23 DIAGNOSIS — E785 Hyperlipidemia, unspecified: Secondary | ICD-10-CM | POA: Diagnosis not present

## 2020-10-23 DIAGNOSIS — E876 Hypokalemia: Secondary | ICD-10-CM | POA: Diagnosis not present

## 2020-10-23 DIAGNOSIS — T502X5A Adverse effect of carbonic-anhydrase inhibitors, benzothiadiazides and other diuretics, initial encounter: Secondary | ICD-10-CM

## 2020-10-23 LAB — LIPID PANEL
Cholesterol: 138 mg/dL (ref 0–200)
HDL: 48 mg/dL (ref >39.00)
LDL Cholesterol: 73 mg/dL (ref 0–99)
NonHDL: 90.41
Total CHOL/HDL Ratio: 3
Triglycerides: 88 mg/dL (ref 0.0–149.0)
VLDL: 17.6 mg/dL (ref 0.0–40.0)

## 2020-10-23 LAB — BASIC METABOLIC PANEL
BUN: 20 mg/dL (ref 6–23)
CO2: 29 mEq/L (ref 19–32)
Calcium: 9.9 mg/dL (ref 8.4–10.5)
Chloride: 103 mEq/L (ref 96–112)
Creatinine, Ser: 0.86 mg/dL (ref 0.40–1.20)
GFR: 77 mL/min (ref >60.00)
Glucose, Bld: 104 mg/dL — ABNORMAL HIGH (ref 70–99)
Potassium: 3.3 mEq/L — ABNORMAL LOW (ref 3.5–5.1)
Sodium: 141 mEq/L (ref 135–145)

## 2020-10-23 LAB — TSH: TSH: 1.55 u[IU]/mL (ref 0.35–4.50)

## 2020-10-23 MED ORDER — POTASSIUM CHLORIDE CRYS ER 20 MEQ PO TBCR: 20.0000 meq | EXTENDED_RELEASE_TABLET | Freq: Two times a day (BID) | ORAL | 0 refills | Status: DC

## 2020-10-23 MED ORDER — HYDRALAZINE HCL 25 MG PO TABS: 25.0000 mg | ORAL_TABLET | Freq: Three times a day (TID) | ORAL | 0 refills | Status: DC

## 2020-10-23 NOTE — Progress Notes (Signed)
Subjective:  Patient ID: Jacqueline Ferrell, female    DOB: [birth year]  Age: 55 y.o. MRN: [id number]  CC: Hypertension  This visit occurred during the SARS-CoV-2 public health emergency.  Safety protocols were in place, including screening questions prior to the visit, additional usage of staff PPE, and extensive cleaning of exam room while observing appropriate contact time as indicated for disinfecting solutions.    HPI Jacqueline Ferrell presents for f/up -   She has been walking and denies any recent episodes of chest pain, shortness of breath, palpitations, edema, or fatigue.  She complains that her blood pressure is not adequately well controlled and she has had a few headaches recently.  Outpatient Medications Prior to Visit  Medication Sig Dispense Refill  . acetaminophen (TYLENOL) 500 MG tablet Take 1,000 mg by mouth every 12 (twelve) hours as needed for moderate pain.    Marland Kitchen aspirin 81 MG EC tablet TAKE 1 TABLET BY MOUTH EVERY DAY 90 tablet 1  . atorvastatin (LIPITOR) 80 MG tablet TAKE 1 TABLET BY MOUTH EVERY DAY 90 tablet 1  . Cholecalciferol 50 MCG (2000 UT) TABS Take 1 tablet (2,000 Units total) by mouth daily. 90 tablet 3  . Evolocumab (REPATHA SURECLICK) 939 MG/ML SOAJ Inject 1 Dose into the skin every 14 (fourteen) days. 6 mL 3  . gabapentin (NEURONTIN) 100 MG capsule Take 1 capsule (100 mg total) by mouth at bedtime. 90 capsule 1  . Magnesium Oxide 250 MG TABS Take 1 tablet (250 mg total) by mouth at bedtime. 90 tablet 1  . nebivolol (BYSTOLIC) 5 MG tablet Take 1 tablet (5 mg total) by mouth daily. 90 tablet 0  . terbinafine (LAMISIL) 250 MG tablet Take 1 tablet (250 mg total) by mouth daily. 90 tablet 0  . tiZANidine (ZANAFLEX) 4 MG tablet Take 1 tablet (4 mg total) by mouth at bedtime. 90 tablet 1  . triamterene-hydrochlorothiazide (DYAZIDE) 37.5-25 MG capsule Take 1 each (1 capsule total) by mouth daily. 90 capsule 0   No facility-administered medications prior to visit.     ROS Review of Systems  Constitutional: Negative.  Negative for diaphoresis and fatigue.  HENT: Negative.   Eyes: Negative.   Respiratory: Negative for cough, chest tightness, shortness of breath and wheezing.   Cardiovascular: Negative for chest pain, palpitations and leg swelling.  Gastrointestinal: Negative for abdominal pain, constipation, diarrhea, nausea and vomiting.  Endocrine: Negative.   Genitourinary: Negative.  Negative for difficulty urinating.  Musculoskeletal: Positive for arthralgias. Negative for myalgias.  Skin: Negative.  Negative for color change and pallor.  Neurological: Positive for headaches. Negative for dizziness, weakness and light-headedness.  Hematological: Negative for adenopathy. Does not bruise/bleed easily.  Psychiatric/Behavioral: Negative.     Objective:  BP (!) 144/90 (BP Location: Left Arm, Patient Position: Sitting, Cuff Size: Large)   Pulse 60   Temp 98.3 F (36.8 C) (Oral)   Resp 16   Ht 5\' 5"  (1.651 m)   LMP 02/10/2008   SpO2 98%   BMI 44.10 kg/m   BP Readings from Last 3 Encounters:  10/23/20 (!) 144/90  09/18/20 118/78  09/08/20 (!) 152/98    Wt Readings from Last 3 Encounters:  06/30/20 265 lb (120.2 kg)  02/27/20 255 lb (115.7 kg)  01/07/20 260 lb 9.6 oz (118.2 kg)    Physical Exam Vitals reviewed.  HENT:     Nose: Nose normal.     Mouth/Throat:     Mouth: Mucous membranes are moist.  Eyes:  General: No scleral icterus.    Conjunctiva/sclera: Conjunctivae normal.  Cardiovascular:     Rate and Rhythm: Normal rate and regular rhythm.     Heart sounds: No murmur heard.   Pulmonary:     Effort: Pulmonary effort is normal.     Breath sounds: Normal breath sounds. No wheezing or rales.  Abdominal:     General: Abdomen is protuberant. There is no distension.     Palpations: Abdomen is soft.  Musculoskeletal:        General: Normal range of motion.     Cervical back: Neck supple.     Right lower leg: No  edema.     Left lower leg: No edema.  Lymphadenopathy:     Cervical: No cervical adenopathy.  Skin:    General: Skin is warm and dry.  Neurological:     General: No focal deficit present.     Mental Status: She is alert.  Psychiatric:        Mood and Affect: Mood normal.        Behavior: Behavior normal.     Lab Results  Component Value Date   WBC 6.8 12/27/2019   HGB 14.6 12/27/2019   HCT 43.0 12/27/2019   PLT 183.0 12/27/2019   GLUCOSE 104 (H) 10/23/2020   CHOL 138 10/23/2020   TRIG 88.0 10/23/2020   HDL 48.00 10/23/2020   LDLCALC 73 10/23/2020   ALT 13 09/08/2020   AST 18 09/08/2020   NA 141 10/23/2020   K 3.3 (L) 10/23/2020   CL 103 10/23/2020   CREATININE 0.86 10/23/2020   BUN 20 10/23/2020   CO2 29 10/23/2020   TSH 1.55 10/23/2020   INR 1.1 (H) 09/11/2010   HGBA1C 5.5 01/11/2019    MM DIGITAL SCREENING BILATERAL  Result Date: 01/15/2020 CLINICAL DATA:  Screening. EXAM: DIGITAL SCREENING BILATERAL MAMMOGRAM WITH CAD COMPARISON:  Previous exam(s). ACR Breast Density Category c: The breast tissue is heterogeneously dense, which may obscure small masses. FINDINGS: There are no findings suspicious for malignancy. Images were processed with CAD. IMPRESSION: No mammographic evidence of malignancy. A result letter of this screening mammogram will be mailed directly to the patient. RECOMMENDATION: Screening mammogram in one year. (Code:SM-B-01Y) BI-RADS CATEGORY  1: Negative. Electronically Signed   By: Evangeline Dakin M.D.   On: 01/15/2020 12:26    Assessment & Plan:   Aayushi was seen today for hypertension.  Diagnoses and all orders for this visit:  Essential hypertension- Her blood pressure is not adequately well controlled and she is symptomatic. -     hydrALAZINE (APRESOLINE) 25 MG tablet; Take 1 tablet (25 mg total) by mouth 3 (three) times daily.  Atherosclerosis of native coronary artery of native heart without angina pectoris- Will continue to address risk  factor modifications. -     hydrALAZINE (APRESOLINE) 25 MG tablet; Take 1 tablet (25 mg total) by mouth 3 (three) times daily. -     Lipid panel; Future -     Lipid panel  Hyperlipidemia with target LDL less than 70- She has achieved her LDL goal is doing well on the statin. -     TSH; Future -     Lipid panel; Future -     Lipid panel -     TSH  Primary hypertension- Her blood pressure is not adequately well controlled and she is symptomatic.  Will add hydralazine and will treat the hypokalemia. -     TSH; Future -  Basic metabolic panel; Future -     Basic metabolic panel -     TSH -     potassium chloride SA (KLOR-CON) 20 MEQ tablet; Take 1 tablet (20 mEq total) by mouth 2 (two) times daily.  Diuretic-induced hypokalemia -     potassium chloride SA (KLOR-CON) 20 MEQ tablet; Take 1 tablet (20 mEq total) by mouth 2 (two) times daily.   I am having Amariz Flamenco. Biswas start on hydrALAZINE and potassium chloride SA. I am also having her maintain her acetaminophen, aspirin, Magnesium Oxide, Cholecalciferol, Repatha SureClick, tiZANidine, gabapentin, terbinafine, atorvastatin, triamterene-hydrochlorothiazide, and nebivolol.  Meds ordered this encounter  Medications  . hydrALAZINE (APRESOLINE) 25 MG tablet    Sig: Take 1 tablet (25 mg total) by mouth 3 (three) times daily.    Dispense:  270 tablet    Refill:  0  . potassium chloride SA (KLOR-CON) 20 MEQ tablet    Sig: Take 1 tablet (20 mEq total) by mouth 2 (two) times daily.    Dispense:  180 tablet    Refill:  0     Follow-up: Return in about 3 months (around 01/22/2021).  Scarlette Calico, MD

## 2020-10-23 NOTE — Patient Instructions (Signed)

## 2020-11-01 ENCOUNTER — Other Ambulatory Visit: Payer: Self-pay | Admitting: Internal Medicine

## 2020-11-01 DIAGNOSIS — I10 Essential (primary) hypertension: Secondary | ICD-10-CM

## 2020-11-03 ENCOUNTER — Other Ambulatory Visit: Payer: Self-pay

## 2020-11-03 ENCOUNTER — Ambulatory Visit: Payer: PRIVATE HEALTH INSURANCE

## 2020-11-03 ENCOUNTER — Ambulatory Visit
Admission: RE | Admit: 2020-11-03 | Discharge: 2020-11-03 | Disposition: A | Discharge status: Home / Self Care | Payer: PRIVATE HEALTH INSURANCE | Source: Ambulatory Visit | Attending: Internal Medicine | Admitting: Internal Medicine

## 2020-11-03 DIAGNOSIS — N63 Unspecified lump in unspecified breast: Secondary | ICD-10-CM

## 2020-11-03 DIAGNOSIS — N644 Mastodynia: Secondary | ICD-10-CM

## 2020-12-07 ENCOUNTER — Other Ambulatory Visit: Payer: Self-pay | Admitting: Internal Medicine

## 2020-12-07 DIAGNOSIS — B351 Tinea unguium: Secondary | ICD-10-CM

## 2021-01-17 ENCOUNTER — Other Ambulatory Visit: Payer: Self-pay | Admitting: Internal Medicine

## 2021-01-17 DIAGNOSIS — I10 Essential (primary) hypertension: Secondary | ICD-10-CM

## 2021-01-17 DIAGNOSIS — E876 Hypokalemia: Secondary | ICD-10-CM

## 2021-01-17 DIAGNOSIS — I2510 Atherosclerotic heart disease of native coronary artery without angina pectoris: Secondary | ICD-10-CM

## 2021-01-17 DIAGNOSIS — T502X5A Adverse effect of carbonic-anhydrase inhibitors, benzothiadiazides and other diuretics, initial encounter: Secondary | ICD-10-CM

## 2021-02-13 ENCOUNTER — Other Ambulatory Visit: Payer: Self-pay | Admitting: Internal Medicine

## 2021-02-13 DIAGNOSIS — I10 Essential (primary) hypertension: Secondary | ICD-10-CM

## 2021-02-13 DIAGNOSIS — I2510 Atherosclerotic heart disease of native coronary artery without angina pectoris: Secondary | ICD-10-CM

## 2021-02-17 ENCOUNTER — Other Ambulatory Visit: Payer: Self-pay | Admitting: Internal Medicine

## 2021-02-17 DIAGNOSIS — I10 Essential (primary) hypertension: Secondary | ICD-10-CM

## 2021-03-25 ENCOUNTER — Other Ambulatory Visit: Payer: Self-pay | Admitting: Internal Medicine

## 2021-03-27 ENCOUNTER — Other Ambulatory Visit: Payer: Self-pay | Admitting: Internal Medicine

## 2021-03-27 DIAGNOSIS — E785 Hyperlipidemia, unspecified: Secondary | ICD-10-CM

## 2021-03-27 DIAGNOSIS — I2510 Atherosclerotic heart disease of native coronary artery without angina pectoris: Secondary | ICD-10-CM

## 2021-04-14 ENCOUNTER — Ambulatory Visit (INDEPENDENT_AMBULATORY_CARE_PROVIDER_SITE_OTHER): Payer: PRIVATE HEALTH INSURANCE

## 2021-04-14 ENCOUNTER — Ambulatory Visit (INDEPENDENT_AMBULATORY_CARE_PROVIDER_SITE_OTHER): Payer: PRIVATE HEALTH INSURANCE | Admitting: Sports Medicine

## 2021-04-14 ENCOUNTER — Encounter: Payer: Self-pay | Admitting: Sports Medicine

## 2021-04-14 ENCOUNTER — Other Ambulatory Visit: Payer: Self-pay

## 2021-04-14 VITALS — BP 118/76 | HR 67 | Ht 65.0 in

## 2021-04-14 DIAGNOSIS — M47816 Spondylosis without myelopathy or radiculopathy, lumbar region: Secondary | ICD-10-CM | POA: Diagnosis not present

## 2021-04-14 DIAGNOSIS — M5441 Lumbago with sciatica, right side: Secondary | ICD-10-CM

## 2021-04-14 DIAGNOSIS — M48061 Spinal stenosis, lumbar region without neurogenic claudication: Secondary | ICD-10-CM | POA: Diagnosis not present

## 2021-04-14 DIAGNOSIS — M4316 Spondylolisthesis, lumbar region: Secondary | ICD-10-CM | POA: Diagnosis not present

## 2021-04-14 NOTE — Progress Notes (Signed)
Jacqueline Ferrell D.[place] Phone: [phone]   Assessment and Plan:     1. Spinal stenosis of lumbar region, unspecified whether neurogenic claudication present 2. Facet arthropathy, lumbar 3. Anterolisthesis of lumbar spine 4. Acute bilateral low back pain with right-sided sciatica -Acute pain on multiple chronic conditions, initial sports medicine visit - Multiple chronic findings on lumbar MRI from 2018 with progressive pain, radicular symptoms, weakness into right leg over the past 1 to 2 months - Patient elects to trial conservative therapy to start.  Start HEP for low back.  Start Tylenol 1000 mg 3 times daily for pain control.  Patient says she was told not to use NSAIDs due to her "heart condition" with history of CAD - Patient to call back in 2 to 4 weeks.  If improved, will continue conservative therapy.  If no improvement may consider lumbar MRI +/- spinal surgery referral -X-ray obtained in clinic.  My interpretation: No acute fracture.  Decreased disc space consistent with lumbar DDD.  Decreased height of L5 vertebral body   Pertinent previous records reviewed include previous MRI x2, PM&R note   Follow Up:  Patient to call back in 2 to 4 weeks.  If improved, will continue conservative therapy.  If no improvement may consider lumbar MRI +/- spinal surgery referral     Subjective:   I, Jacqueline Ferrell, am serving as a scribe for Dr. Glennon Mac  Chief Complaint: right lower back pain   HPI:   04/14/21 Patient is a 55 year old female presenting with R lower back pain for years but recently in the last 3 weeks has had two falls and the pain in the back has gotten worse. Patient states that pain is mostly on the right side of her low back but is also around her pelvic area. second fall she kind of tripped over her own feet fell forward first fall patient slipped on water in her bathroom hit her  head on the mirror and fell forward. Patient states that her balance is off and she tried to avoid areas that have uneven floors.   Radiates:  Numbness/tingling: yes R side Weakness: yes Aggravates: sitting too long, going from seated to standing, and walking too long  Treatments tried: chiropractor, water therapy, lost weight, tylenol, gabapentin, tizanidine, heat.   Relevant Historical Information: CAD, facet arthropathy, spinal stenosis  Additional pertinent review of systems negative.   Current Outpatient Medications:    acetaminophen (TYLENOL) 500 MG tablet, Take 1,000 mg by mouth every 12 (twelve) hours as needed for moderate pain., Disp: , Rfl:    aspirin 81 MG EC tablet, TAKE 1 TABLET BY MOUTH EVERY DAY, Disp: 90 tablet, Rfl: 1   atorvastatin (LIPITOR) 80 MG tablet, TAKE 1 TABLET BY MOUTH EVERY DAY, Disp: 30 tablet, Rfl: 5   Cholecalciferol 50 MCG (2000 UT) TABS, Take 1 tablet (2,000 Units total) by mouth daily., Disp: 90 tablet, Rfl: 3   gabapentin (NEURONTIN) 100 MG capsule, Take 1 capsule (100 mg total) by mouth at bedtime., Disp: 90 capsule, Rfl: 1   hydrALAZINE (APRESOLINE) 25 MG tablet, TAKE 1 TABLET BY MOUTH THREE TIMES A DAY, Disp: 90 tablet, Rfl: 3   KLOR-CON M20 20 MEQ tablet, TAKE 1 TABLET BY MOUTH TWICE A DAY, Disp: 180 tablet, Rfl: 0   Magnesium Oxide 250 MG TABS, Take 1 tablet (250 mg total) by mouth at bedtime., Disp: 90 tablet, Rfl: 1  nebivolol (BYSTOLIC) 5 MG tablet, TAKE 1 TABLET (5 MG TOTAL) BY MOUTH DAILY., Disp: 30 tablet, Rfl: 2   REPATHA SURECLICK 718 MG/ML SOAJ, INJECT 1 DOSE INTO THE SKIN EVERY 14 (FOURTEEN) DAYS., Disp: 2 mL, Rfl: 3   tiZANidine (ZANAFLEX) 4 MG tablet, Take 1 tablet (4 mg total) by mouth at bedtime., Disp: 90 tablet, Rfl: 1   triamterene-hydrochlorothiazide (DYAZIDE) 37.5-25 MG capsule, TAKE 1 CAPSULE BY MOUTH EVERY DAY, Disp: 30 capsule, Rfl: 2   Objective:     Vitals:   04/14/21 1024  BP: 118/76  Pulse: 67  SpO2: 97%  Height:  5\' 5"  (1.651 m)      Body mass index is 44.1 kg/m.    Physical Exam:    Gen: Appears well, nad, nontoxic and pleasant Psych: Alert and oriented, appropriate mood and affect Neuro: Decreased sensation over the dorsum of right foot.  4/5 strength in right foot dorsiflexion, otherwise strength is 5/5 in upper and lower extremities, muscle tone wnl Skin: no susupicious lesions or rashes  Back - Normal skin, Spine with normal alignment and no deformity.   No tenderness to vertebral process palpation.   Paraspinous muscles are tender bilaterally and without spasm Straight leg raise positive on right Negative Lhermitte's  Electronically signed by:  Jacqueline Ferrell D.[facility] 11:07 AM 04/14/21

## 2021-04-14 NOTE — Patient Instructions (Addendum)
Good to see you  Xray on way out Start Tylenol 1000mg  total 3 times a day Low back exercises given Call in 2-4 weeks with an update.

## 2021-04-15 ENCOUNTER — Encounter: Payer: Self-pay | Admitting: Sports Medicine

## 2021-04-15 DIAGNOSIS — S32059A Unspecified fracture of fifth lumbar vertebra, initial encounter for closed fracture: Secondary | ICD-10-CM

## 2021-04-15 NOTE — Progress Notes (Unsigned)
MRI order placed and referral sent to [facility] neurosurgery and spine  Letter written for patient, patient informed, and will have someone pick it up later

## 2021-04-15 NOTE — Telephone Encounter (Signed)
This encounter was created in error - please disregard.

## 2021-04-21 ENCOUNTER — Other Ambulatory Visit: Payer: Self-pay

## 2021-04-21 ENCOUNTER — Other Ambulatory Visit: Payer: PRIVATE HEALTH INSURANCE

## 2021-04-21 ENCOUNTER — Encounter: Payer: Self-pay | Admitting: Sports Medicine

## 2021-04-21 DIAGNOSIS — M4646 Discitis, unspecified, lumbar region: Secondary | ICD-10-CM

## 2021-04-21 DIAGNOSIS — M5441 Lumbago with sciatica, right side: Secondary | ICD-10-CM

## 2021-04-21 LAB — URINALYSIS, ROUTINE W REFLEX MICROSCOPIC
Hgb urine dipstick: NEGATIVE
Nitrite: NEGATIVE
RBC / HPF: NONE SEEN (ref 0–?)
Specific Gravity, Urine: 1.02 (ref 1.000–1.030)
Total Protein, Urine: NEGATIVE
Urine Glucose: NEGATIVE
Urobilinogen, UA: 1 (ref 0.0–1.0)
pH: 6 (ref 5.0–8.0)

## 2021-04-21 LAB — SEDIMENTATION RATE: Sed Rate: 17 mm/hr (ref 0–30)

## 2021-04-21 LAB — C-REACTIVE PROTEIN: CRP: <1 mg/dL (ref 0.5–20.0)

## 2021-04-21 NOTE — Telephone Encounter (Signed)
Letter written and placed up front. Sticky not on letter with [facility] phone number to call and schedule.

## 2021-04-21 NOTE — Telephone Encounter (Signed)
I called and spoke with patient.  She informed me of her concerns that she had not been contacted by neurosurgery to establish care as well as her concerns asking if she should be started on antibiotics with x-ray read showing potential discitis.  Patient denies fever, chest pain, syncope, severe fatigue, profuse sweating.  States that back pain is still moderate to severe with radicular symptoms going down her right leg, but states that it has not advanced since our office visit.  Has no history of implants, artificial valves, joint replacements, other indwelling metal that could be potential source of infection.  Has not had a superficial wound that could be source of infection.  She states that she does have frequent UTIs that she often does not have treated with antibiotics, but rather uses OTC Azo.  She feels like she had 1 such episode about 2 weeks ago.  Plan: - Patient was hemodynamically stable in clinic and appears hemodynamically stable based on our conversation.  She was educated on red flag signs that would warrant ER evaluation.  Potential source of infection could include UTI.  Will evaluate with UA with culture, blood culture, ESR, CRP.  If inflammatory markers are elevated, could consider starting empiric antibiotic therapy for 6 weeks.  If they are not elevated, would continue with MRI and neurosurgery evaluation.  Due to potential severity of discitis and patient's severe pain, we will look into referral and see if it can be more urgently established.  Patient to come in today for lab work as well as a new work note to be out of work an additional 1 week while we continue evaluation.

## 2021-04-21 NOTE — Telephone Encounter (Signed)
[facility] is not in network with the [employer].  Currently trying to find an office that is in network.

## 2021-04-21 NOTE — Telephone Encounter (Addendum)
Sending referral to [facility]  Phone: [phone] Fax: [phone]

## 2021-04-22 LAB — URINE CULTURE

## 2021-04-22 NOTE — Progress Notes (Signed)
Patient called.  Patient aware.  Discussed that with normal CRP and sed rate as well as UA negative for nitrites, it is unlikely that patient is experiencing a infection in her spine.  We will continue with urine culture and blood culture as well as lumbar MRI and spine surgery referral for further evaluation, but these results as well as patient's vitals being stable are reassuring.  Patient verbalized understanding and had no further questions

## 2021-04-27 LAB — CULTURE, BLOOD (SINGLE)

## 2021-04-28 ENCOUNTER — Ambulatory Visit
Admission: RE | Admit: 2021-04-28 | Discharge: 2021-04-28 | Disposition: A | Discharge status: Home / Self Care | Payer: PRIVATE HEALTH INSURANCE | Source: Ambulatory Visit | Attending: Sports Medicine | Admitting: Sports Medicine

## 2021-04-28 ENCOUNTER — Other Ambulatory Visit: Payer: Self-pay

## 2021-04-28 DIAGNOSIS — M47816 Spondylosis without myelopathy or radiculopathy, lumbar region: Secondary | ICD-10-CM

## 2021-04-28 DIAGNOSIS — M4316 Spondylolisthesis, lumbar region: Secondary | ICD-10-CM

## 2021-04-28 DIAGNOSIS — M48061 Spinal stenosis, lumbar region without neurogenic claudication: Secondary | ICD-10-CM

## 2021-04-28 MED ORDER — GADOBENATE DIMEGLUMINE 529 MG/ML IV SOLN
20.0000 mL | Freq: Once | INTRAVENOUS | Status: AC | PRN
  Administered 2021-04-28: 20 mL via INTRAVENOUS

## 2021-04-29 ENCOUNTER — Other Ambulatory Visit: Payer: Self-pay

## 2021-04-29 DIAGNOSIS — G834 Cauda equina syndrome: Secondary | ICD-10-CM

## 2021-04-29 DIAGNOSIS — M48061 Spinal stenosis, lumbar region without neurogenic claudication: Secondary | ICD-10-CM

## 2021-04-29 NOTE — Progress Notes (Signed)
Urgent referral placed will fax to [facility] and send patient a mychart message with info of office attached.

## 2021-05-03 ENCOUNTER — Other Ambulatory Visit: Payer: Self-pay | Admitting: Internal Medicine

## 2021-05-03 DIAGNOSIS — G4762 Sleep related leg cramps: Secondary | ICD-10-CM

## 2021-05-03 DIAGNOSIS — I10 Essential (primary) hypertension: Secondary | ICD-10-CM

## 2021-05-03 DIAGNOSIS — M48062 Spinal stenosis, lumbar region with neurogenic claudication: Secondary | ICD-10-CM

## 2021-05-03 DIAGNOSIS — T502X5A Adverse effect of carbonic-anhydrase inhibitors, benzothiadiazides and other diuretics, initial encounter: Secondary | ICD-10-CM

## 2021-05-03 DIAGNOSIS — E876 Hypokalemia: Secondary | ICD-10-CM

## 2021-05-13 ENCOUNTER — Other Ambulatory Visit: Payer: Self-pay | Admitting: *Deleted

## 2021-05-13 DIAGNOSIS — I10 Essential (primary) hypertension: Secondary | ICD-10-CM

## 2021-05-13 DIAGNOSIS — I2510 Atherosclerotic heart disease of native coronary artery without angina pectoris: Secondary | ICD-10-CM

## 2021-05-13 DIAGNOSIS — E785 Hyperlipidemia, unspecified: Secondary | ICD-10-CM

## 2021-05-17 ENCOUNTER — Other Ambulatory Visit: Payer: Self-pay | Admitting: Internal Medicine

## 2021-05-17 DIAGNOSIS — T502X5A Adverse effect of carbonic-anhydrase inhibitors, benzothiadiazides and other diuretics, initial encounter: Secondary | ICD-10-CM

## 2021-05-17 DIAGNOSIS — I10 Essential (primary) hypertension: Secondary | ICD-10-CM

## 2021-05-17 DIAGNOSIS — E876 Hypokalemia: Secondary | ICD-10-CM

## 2021-05-21 ENCOUNTER — Other Ambulatory Visit: Payer: Self-pay

## 2021-05-21 ENCOUNTER — Ambulatory Visit: Payer: PRIVATE HEALTH INSURANCE | Admitting: Internal Medicine

## 2021-05-21 ENCOUNTER — Encounter: Payer: Self-pay | Admitting: Internal Medicine

## 2021-05-21 VITALS — BP 142/86 | HR 57 | Temp 97.7°F | Resp 16 | Ht 65.0 in

## 2021-05-21 DIAGNOSIS — Z1211 Encounter for screening for malignant neoplasm of colon: Secondary | ICD-10-CM | POA: Insufficient documentation

## 2021-05-21 DIAGNOSIS — I2510 Atherosclerotic heart disease of native coronary artery without angina pectoris: Secondary | ICD-10-CM

## 2021-05-21 DIAGNOSIS — I10 Essential (primary) hypertension: Secondary | ICD-10-CM

## 2021-05-21 DIAGNOSIS — M48062 Spinal stenosis, lumbar region with neurogenic claudication: Secondary | ICD-10-CM

## 2021-05-21 DIAGNOSIS — E559 Vitamin D deficiency, unspecified: Secondary | ICD-10-CM

## 2021-05-21 DIAGNOSIS — Z23 Encounter for immunization: Secondary | ICD-10-CM

## 2021-05-21 DIAGNOSIS — M170 Bilateral primary osteoarthritis of knee: Secondary | ICD-10-CM | POA: Insufficient documentation

## 2021-05-21 LAB — CBC WITH DIFFERENTIAL/PLATELET
Basophils Absolute: 0 10*3/uL (ref 0.0–0.1)
Basophils Relative: 0.5 % (ref 0.0–3.0)
Eosinophils Absolute: 0 10*3/uL (ref 0.0–0.7)
Eosinophils Relative: 0.7 % (ref 0.0–5.0)
HCT: 42.2 % (ref 36.0–46.0)
Hemoglobin: 14 g/dL (ref 12.0–15.0)
Lymphocytes Relative: 40.3 % (ref 12.0–46.0)
Lymphs Abs: 2.3 10*3/uL (ref 0.7–4.0)
MCHC: 33.1 g/dL (ref 30.0–36.0)
MCV: 92.1 fl (ref 78.0–100.0)
Monocytes Absolute: 0.5 10*3/uL (ref 0.1–1.0)
Monocytes Relative: 8 % (ref 3.0–12.0)
Neutro Abs: 2.9 10*3/uL (ref 1.4–7.7)
Neutrophils Relative %: 50.5 % (ref 43.0–77.0)
Platelets: 190 10*3/uL (ref 150.0–400.0)
RBC: 4.58 Mil/uL (ref 3.87–5.11)
RDW: 13.8 % (ref 11.5–15.5)
WBC: 5.7 10*3/uL (ref 4.0–10.5)

## 2021-05-21 LAB — BASIC METABOLIC PANEL
BUN: 20 mg/dL (ref 6–23)
CO2: 28 mEq/L (ref 19–32)
Calcium: 10 mg/dL (ref 8.4–10.5)
Chloride: 102 mEq/L (ref 96–112)
Creatinine, Ser: 0.87 mg/dL (ref 0.40–1.20)
GFR: 75.63 mL/min (ref >60.00)
Glucose, Bld: 78 mg/dL (ref 70–99)
Potassium: 4.4 mEq/L (ref 3.5–5.1)
Sodium: 140 mEq/L (ref 135–145)

## 2021-05-21 LAB — VITAMIN D 25 HYDROXY (VIT D DEFICIENCY, FRACTURES): VITD: 24.96 ng/mL — ABNORMAL LOW (ref 30.00–100.00)

## 2021-05-21 MED ORDER — CELECOXIB 50 MG PO CAPS: 50.0000 mg | ORAL_CAPSULE | Freq: Two times a day (BID) | ORAL | 1 refills | Status: DC

## 2021-05-21 MED ORDER — CHOLECALCIFEROL 50 MCG (2000 UT) PO TABS: 1.0000 | ORAL_TABLET | Freq: Every day | ORAL | 1 refills | Status: DC

## 2021-05-21 MED ORDER — ASPIRIN 81 MG PO TBEC
81.0000 mg | DELAYED_RELEASE_TABLET | Freq: Every day | ORAL | 1 refills | Status: DC
Start: 2021-05-21 — End: 2022-07-13

## 2021-05-21 NOTE — Patient Instructions (Signed)

## 2021-05-21 NOTE — Progress Notes (Signed)
Subjective:  Patient ID: Jacqueline Ferrell, female    DOB: October 26, 1965  Age: 55 y.o. MRN: [id number]  CC: Hypertension  This visit occurred during the SARS-CoV-2 public health emergency.  Safety protocols were in place, including screening questions prior to the visit, additional usage of staff PPE, and extensive cleaning of exam room while observing appropriate contact time as indicated for disinfecting solutions.    HPI Jacqueline Ferrell presents for f/up -  She continues to complain of pain in her low back and knees.  She tells me she recently saw a neurosurgeon and had an MRI done of her lumbar spine.  She does not know what her treatment options are at this time.  She tells me her blood pressure has been well controlled.  She denies headache, blurred vision, chest pain, shortness of breath, diaphoresis, or edema.  Outpatient Medications Prior to Visit  Medication Sig Dispense Refill   acetaminophen (TYLENOL) 500 MG tablet Take 1,000 mg by mouth every 12 (twelve) hours as needed for moderate pain.     aspirin 81 MG EC tablet TAKE 1 TABLET BY MOUTH EVERY DAY 90 tablet 1   atorvastatin (LIPITOR) 80 MG tablet TAKE 1 TABLET BY MOUTH EVERY DAY 30 tablet 5   gabapentin (NEURONTIN) 100 MG capsule TAKE 1 CAPSULE BY MOUTH AT BEDTIME. 90 capsule 0   hydrALAZINE (APRESOLINE) 25 MG tablet TAKE 1 TABLET BY MOUTH THREE TIMES A DAY 90 tablet 3   KLOR-CON M20 20 MEQ tablet TAKE 1 TABLET BY MOUTH TWICE A DAY 180 tablet 0   Magnesium Oxide 250 MG TABS Take 1 tablet (250 mg total) by mouth at bedtime. 90 tablet 1   nebivolol (BYSTOLIC) 5 MG tablet TAKE 1 TABLET (5 MG TOTAL) BY MOUTH DAILY. 30 tablet 2   REPATHA SURECLICK 867 MG/ML SOAJ INJECT 1 DOSE INTO THE SKIN EVERY 14 (FOURTEEN) DAYS. 2 mL 3   tiZANidine (ZANAFLEX) 4 MG tablet Take 1 tablet (4 mg total) by mouth at bedtime. 90 tablet 1   triamterene-hydrochlorothiazide (DYAZIDE) 37.5-25 MG capsule TAKE 1 CAPSULE BY MOUTH EVERY DAY 30 capsule 2    Cholecalciferol 50 MCG (2000 UT) TABS Take 1 tablet (2,000 Units total) by mouth daily. 90 tablet 3   No facility-administered medications prior to visit.    ROS Review of Systems  Constitutional:  Positive for unexpected weight change (wt gain). Negative for diaphoresis and fatigue.  HENT: Negative.    Eyes: Negative.   Respiratory:  Negative for cough, chest tightness, shortness of breath and wheezing.   Cardiovascular:  Negative for chest pain, palpitations and leg swelling.  Gastrointestinal:  Negative for abdominal pain, constipation, diarrhea, nausea and vomiting.  Endocrine: Negative.   Genitourinary: Negative.  Negative for difficulty urinating and dysuria.  Musculoskeletal:  Positive for arthralgias and back pain. Negative for joint swelling.  Skin: Negative.   Neurological:  Negative for dizziness, weakness, light-headedness and headaches.  Hematological:  Negative for adenopathy. Does not bruise/bleed easily.  Psychiatric/Behavioral: Negative.     Objective:  BP (!) 142/86 (BP Location: Right Arm, Patient Position: Sitting, Cuff Size: Large)   Pulse (!) 57   Temp 97.7 F (36.5 C) (Oral)   Resp 16   Ht 5\' 5"  (1.651 m)   LMP 02/10/2008   SpO2 97%   BMI 44.10 kg/m   BP Readings from Last 3 Encounters:  05/21/21 (!) 142/86  04/14/21 118/76  10/23/20 (!) 144/90    Wt Readings from Last 3 Encounters:  06/30/20 265 lb (120.2 kg)  02/27/20 255 lb (115.7 kg)  01/07/20 260 lb 9.6 oz (118.2 kg)    Physical Exam Vitals reviewed.  Constitutional:      Appearance: She is obese.  HENT:     Nose: Nose normal.     Mouth/Throat:     Mouth: Mucous membranes are moist.  Eyes:     Conjunctiva/sclera: Conjunctivae normal.  Cardiovascular:     Rate and Rhythm: Regular rhythm. Bradycardia present.     Heart sounds: Normal heart sounds, S1 normal and S2 normal. No murmur heard.   No friction rub. No gallop.     Comments: EKG- Sinus bradycardia, 53 bpm Anterior infract  pattern - unchanged No LVH  Pulmonary:     Effort: Pulmonary effort is normal.     Breath sounds: No stridor. No wheezing, rhonchi or rales.  Abdominal:     General: Abdomen is protuberant. There is no distension.  Musculoskeletal:        General: No swelling.     Cervical back: Neck supple.     Right lower leg: No edema.     Left lower leg: No edema.  Skin:    General: Skin is warm and dry.  Neurological:     General: No focal deficit present.     Mental Status: She is alert.  Psychiatric:        Mood and Affect: Mood normal.        Behavior: Behavior normal.    Lab Results  Component Value Date   WBC 5.7 05/21/2021   HGB 14.0 05/21/2021   HCT 42.2 05/21/2021   PLT 190.0 05/21/2021   GLUCOSE 78 05/21/2021   CHOL 138 10/23/2020   TRIG 88.0 10/23/2020   HDL 48.00 10/23/2020   LDLCALC 73 10/23/2020   ALT 13 09/08/2020   AST 18 09/08/2020   NA 140 05/21/2021   K 4.4 moderate hemolysis 05/21/2021   CL 102 05/21/2021   CREATININE 0.87 05/21/2021   BUN 20 05/21/2021   CO2 28 05/21/2021   TSH 1.55 10/23/2020   INR 1.1 (H) 09/11/2010   HGBA1C 5.5 01/11/2019    MR Lumbar Spine W Wo Contrast  Result Date: 04/29/2021 CLINICAL DATA:  Lower back pain EXAM: MRI LUMBAR SPINE WITHOUT AND WITH CONTRAST TECHNIQUE: Multiplanar and multiecho pulse sequences of the lumbar spine were obtained without and with intravenous contrast. CONTRAST:  67mL MULTIHANCE GADOBENATE DIMEGLUMINE 529 MG/ML IV SOLN COMPARISON:  Lumbar spine MRI 06/24/2017 FINDINGS: Segmentation:  The lowest formed disc space is designated L5-S1. Alignment: There is 6 mm anterolisthesis of L4 on L5 and 4 mm retrolisthesis of L5 on S1, not significantly changed since 2018. Alignment at the other levels is normal. Vertebrae: There is degenerative endplate marrow signal abnormality at L4-5 and L5-S1. There is no suspicious marrow signal abnormality. There is no abnormal marrow enhancement. Conus medullaris and cauda equina:  Conus extends to the L1-L2 level. Conus and cauda equina appear normal. Paraspinal and other soft tissues: Multiple T2 hyperintense lesions in the kidneys likely reflects cysts. The paraspinal soft tissues are unremarkable. Disc levels: There is multilevel facet arthropathy, most advanced at L4-L5 and L5-S1. There are trace effusions at L5-S1. 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 T12-L1: Mild congenital canal stenosis. No significant neural foraminal stenosis L1-L2: Mild congenital canal stenosis. No significant neural foraminal stenosis. L2-L3: There is a minimal disc bulge and bilateral facet arthropathy superimposed on congenital canal stenosis resulting in mild to  moderate spinal canal stenosis without significant neural foraminal stenosis. Findings are overall not significantly changed. L3-L4: There is a diffuse disc bulge with a left foraminal/extraforaminal component, ligamentum flavum thickening, and bilateral facet arthropathy resulting in moderate spinal canal stenosis with crowding of the subarticular zones, left worse than right, and moderate left and mild right neural foraminal stenosis. Findings are overall not significantly changed L4-L5: There is uncovering of the disc posteriorly with a mild superimposed bulge, ligamentum flavum thickening, degenerative endplate change, and bilateral facet arthropathy resulting in severe spinal canal stenosis with compression of the cauda equina nerve roots and moderate to severe left and moderate right neural foraminal stenosis. Findings are not significantly changed. L5-S1: There is a prominent diffuse disc bulge, degenerative endplate change, and bilateral facet arthropathy resulting in mild spinal canal stenosis with crowding of the bilateral subarticular zones and possible mass effect on the traversing S1 nerve roots and moderate to severe right worse than left neural foraminal stenosis. Findings are overall not significantly changed.  L1-L2: No significant spinal canal or neural foraminal stenosis. IMPRESSION: 1. Multilevel spondylosis and spondylolisthesis are overall not significantly changed compared to the study from 2018. 2. Severe spinal canal stenosis with compression of the cauda equina nerve roots at L4-L5, and moderate spinal canal stenosis at L2-L3 and L3-L4 due to above described degenerative changes superimposed on congenital narrowing of the spinal canal. 3. Moderate left neural foraminal stenosis at L3-L4, severe left and moderate right neural foraminal stenosis at L4-L5, and severe right worse than left neural foraminal stenosis at L5-S1. 4. Multilevel facet arthropathy, most advanced at L4-L5. Trace bilateral facet joint effusions at L5-S1. Electronically Signed   By: Valetta Mole M.D.   On: 04/29/2021 11:45    Assessment & Plan:   Burgundy was seen today for hypertension.  Diagnoses and all orders for this visit:  Primary hypertension- Her blood pressure is not adequately well controlled.  She will continue the current antihypertensives and will improve her lifestyle modifications. -     CBC with Differential/Platelet; Future -     Basic metabolic panel; Future -     EKG 12-Lead -     Basic metabolic panel -     CBC with Differential/Platelet  Atherosclerosis of native coronary artery of native heart without angina pectoris- She has had no recent episodes of angina and her EKG is reassuring.  Screen for colon cancer -     Cologuard  Vitamin D deficiency disease -     VITAMIN D 25 Hydroxy (Vit-D Deficiency, Fractures); Future -     VITAMIN D 25 Hydroxy (Vit-D Deficiency, Fractures) -     Cholecalciferol 50 MCG (2000 UT) TABS; Take 1 tablet (2,000 Units total) by mouth daily.  Spinal stenosis, lumbar region, with neurogenic claudication -     celecoxib (CELEBREX) 50 MG capsule; Take 1 capsule (50 mg total) by mouth 2 (two) times daily.  Primary osteoarthritis of both knees -     celecoxib (CELEBREX) 50  MG capsule; Take 1 capsule (50 mg total) by mouth 2 (two) times daily.  Other orders -     Flu Vaccine QUAD 6+ mos PF IM (Fluarix Quad PF)  I am having Velora Horstman. Ceniceros start on celecoxib. I am also having her maintain her acetaminophen, aspirin, Magnesium Oxide, tiZANidine, hydrALAZINE, triamterene-hydrochlorothiazide, nebivolol, Repatha SureClick, atorvastatin, gabapentin, [place]-Con M20, and Cholecalciferol.  Meds ordered this encounter  Medications   celecoxib (CELEBREX) 50 MG capsule    Sig: Take 1 capsule (  50 mg total) by mouth 2 (two) times daily.    Dispense:  180 capsule    Refill:  1   Cholecalciferol 50 MCG (2000 UT) TABS    Sig: Take 1 tablet (2,000 Units total) by mouth daily.    Dispense:  90 tablet    Refill:  1      Follow-up: Return in about 6 months (around 11/18/2021).  Scarlette Calico, MD

## 2021-05-26 ENCOUNTER — Encounter: Payer: Self-pay | Admitting: Internal Medicine

## 2021-06-12 ENCOUNTER — Other Ambulatory Visit: Payer: Self-pay | Admitting: Internal Medicine

## 2021-06-12 DIAGNOSIS — I10 Essential (primary) hypertension: Secondary | ICD-10-CM

## 2021-07-18 ENCOUNTER — Other Ambulatory Visit: Payer: Self-pay | Admitting: Internal Medicine

## 2021-07-18 DIAGNOSIS — I10 Essential (primary) hypertension: Secondary | ICD-10-CM

## 2021-07-18 DIAGNOSIS — I2510 Atherosclerotic heart disease of native coronary artery without angina pectoris: Secondary | ICD-10-CM

## 2021-08-26 ENCOUNTER — Other Ambulatory Visit: Payer: Self-pay | Admitting: Internal Medicine

## 2021-08-26 DIAGNOSIS — I10 Essential (primary) hypertension: Secondary | ICD-10-CM

## 2021-08-26 DIAGNOSIS — E876 Hypokalemia: Secondary | ICD-10-CM

## 2021-09-17 NOTE — Progress Notes (Signed)
?  ?Cardiology Office Note ? ? ?Date:  09/18/2021  ? ?ID:  Jacqueline Ferrell, DOB 1966-05-19, MRN [id number] ? ?PCP:  Janith Lima, MD  ?Cardiologist:   Minus Breeding, MD  ? ? ?Chief Complaint  ?Patient presents with  ? Coronary Artery Disease  ? ? ?  ?History of Present Illness: ?Jacqueline Ferrell is a 56 y.o. female who presents for follow up of CAD.  She saw Dr. Marlou Porch greater than 3 years ago.   She had a drug-eluting stent to the LAD after progressive exertional chest pain and abnormal stress test. Her heart catheterization showed 80% proximal LAD stenosis and a stent was placed in March of 2012.  She had a negative perfusion study in 2018 as she was considering having bariatric surgery.    She lost 100 pounds.  She has been exercising little bit more but she has significant back problems and really act up when the weather is humid.  She is not describing however any cardiovascular symptoms. The patient denies any new symptoms such as chest discomfort, neck or arm discomfort. There has been no new shortness of breath, PND or orthopnea. There have been no reported palpitations, presyncope or syncope.  ? ? ?Past Medical History:  ?Diagnosis Date  ? Abnormal Pap smear of cervix   ? in her 24's  ? Anemia   ? Arthritis   ? Bulging lumbar disc   ? Coronary artery disease   ? Fibroid   ? Hyperlipidemia   ? Hypertension   ? Migraines   ? in the past  ? Nephrolithiasis   ? Obese   ? PCOS (polycystic ovarian syndrome)   ? Poor compliance with medication   ? ? ?Past Surgical History:  ?Procedure Laterality Date  ? ABDOMINAL HYSTERECTOMY  2009  ? BARIATRIC SURGERY    ? CORONARY ANGIOPLASTY WITH STENT PLACEMENT    ? feet surgery    ? corrective surgery as a child  ? TUBAL LIGATION    ? ? ? ?Current Outpatient Medications  ?Medication Sig Dispense Refill  ? acetaminophen (TYLENOL) 500 MG tablet Take 1,000 mg by mouth every 12 (twelve) hours as needed for moderate pain.    ? aspirin 81 MG EC tablet Take 1 tablet (81 mg total) by  mouth daily. Swallow whole. 90 tablet 1  ? atorvastatin (LIPITOR) 80 MG tablet TAKE 1 TABLET BY MOUTH EVERY DAY 30 tablet 5  ? celecoxib (CELEBREX) 50 MG capsule Take 1 capsule (50 mg total) by mouth 2 (two) times daily. 180 capsule 1  ? Cholecalciferol 50 MCG (2000 UT) TABS Take 1 tablet (2,000 Units total) by mouth daily. 90 tablet 1  ? gabapentin (NEURONTIN) 100 MG capsule TAKE 1 CAPSULE BY MOUTH AT BEDTIME. 90 capsule 0  ? hydrALAZINE (APRESOLINE) 25 MG tablet TAKE 1 TABLET BY MOUTH THREE TIMES A DAY 90 tablet 3  ? KLOR-CON M20 20 MEQ tablet TAKE 1 TABLET BY MOUTH TWICE A DAY 60 tablet 2  ? Magnesium Oxide 250 MG TABS Take 1 tablet (250 mg total) by mouth at bedtime. 90 tablet 1  ? nebivolol (BYSTOLIC) 5 MG tablet TAKE 1 TABLET (5 MG TOTAL) BY MOUTH DAILY. 30 tablet 2  ? tiZANidine (ZANAFLEX) 4 MG tablet Take 1 tablet (4 mg total) by mouth at bedtime. 90 tablet 1  ? triamterene-hydrochlorothiazide (DYAZIDE) 37.5-25 MG capsule TAKE 1 CAPSULE BY MOUTH EVERY DAY 30 capsule 2  ? ?No current facility-administered medications for this visit.  ? ? ?  Allergies:   Lisinopril  ? ? ?ROS:  Please see the history of present illness.   Otherwise, review of systems are positive for none.   All other systems are reviewed and negative.  ? ? ?PHYSICAL EXAM: ?VS:  BP 140/78   Pulse 67   Ht '5\' 7"'$  (1.702 m)   Wt 275 lb (124.7 kg)   LMP 02/10/2008   SpO2 99%   BMI 43.07 kg/m?  , BMI Body mass index is 43.07 kg/m?. ?GENERAL:  Well appearing ?NECK:  No jugular venous distention, waveform within normal limits, carotid upstroke brisk and symmetric, no bruits, no thyromegaly ?LUNGS:  Clear to auscultation bilaterally ?CHEST:  Unremarkable ?HEART:  PMI not displaced or sustained,S1 and S2 within normal limits, no S3, no S4, no clicks, no rubs, no murmurs ?ABD:  Flat, positive bowel sounds normal in frequency in pitch, no bruits, no rebound, no guarding, no midline pulsatile mass, no hepatomegaly, no splenomegaly ?EXT:  2 plus pulses  throughout, no edema, no cyanosis no clubbing ? ? ? ?EKG:  EKG is not ordered today. ? ? ?Recent Labs: ?10/23/2020: TSH 1.55 ?05/21/2021: BUN 20; Creatinine, Ser 0.87; Hemoglobin 14.0; Platelets 190.0; Potassium 4.4 moderate hemolysis; Sodium 140  ? ? ?Lipid Panel ?   ?Component Value Date/Time  ? CHOL 138 10/23/2020 0909  ? CHOL 129 12/07/2017 0943  ? TRIG 88.0 10/23/2020 0909  ? TRIG 81 07/28/2010 1630  ? HDL 48.00 10/23/2020 0909  ? HDL 41 12/07/2017 0943  ? CHOLHDL 3 10/23/2020 0909  ? VLDL 17.6 10/23/2020 0909  ? [place] 73 10/23/2020 0909  ? [place] 77 12/07/2017 0943  ? ?  ? ?Wt Readings from Last 3 Encounters:  ?09/18/21 275 lb (124.7 kg)  ?06/30/20 265 lb (120.2 kg)  ?02/27/20 255 lb (115.7 kg)  ?  ? ? ?Other studies Reviewed ?Additional studies/ records that were reviewed today include: Labs. ?Review of the above records demonstrates: See elsewhere ? ?ASSESSMENT AND PLAN: ? ? ?CAD:  The patient has no new sypmtoms.  No further cardiovascular testing is indicated.  We will continue with aggressive risk reduction and meds as listed. ? ?OBESITY: Very proud of her weight loss.  She is going to start thinking about pool exercises as she did these before the pandemic. ? ?HTN:  The blood pressure is at target.  No change in therapy.   ? ?HYPERLIPIDEMIA:    Her LDL is.  This however was not on the Lipitor.  She could not afford Repatha.  I will have her come back for fasting lipid profile.  If she is not at target she will also be on Zetia.  ? ? ?Current medicines are reviewed at length with the patient today.  The patient does not have concerns regarding medicines. ? ?The following changes have been made:   None ? ?Labs/ tests ordered today include:  ? ?Orders Placed This Encounter  ?Procedures  ? Lipid panel  ? ? ? ?Disposition:   FU with me in 12 months.   ? ? ?Signed, ?Minus Breeding, MD  ?09/18/2021 4:00 PM    ?[place] ? ? ?

## 2021-09-18 ENCOUNTER — Other Ambulatory Visit: Payer: Self-pay

## 2021-09-18 ENCOUNTER — Encounter: Payer: Self-pay | Admitting: Cardiology

## 2021-09-18 ENCOUNTER — Ambulatory Visit (INDEPENDENT_AMBULATORY_CARE_PROVIDER_SITE_OTHER): Payer: Self-pay | Admitting: Cardiology

## 2021-09-18 VITALS — BP 140/78 | HR 67 | Ht 67.0 in | Wt 275.0 lb

## 2021-09-18 DIAGNOSIS — I2510 Atherosclerotic heart disease of native coronary artery without angina pectoris: Secondary | ICD-10-CM

## 2021-09-18 DIAGNOSIS — E785 Hyperlipidemia, unspecified: Secondary | ICD-10-CM

## 2021-09-18 DIAGNOSIS — I10 Essential (primary) hypertension: Secondary | ICD-10-CM

## 2021-09-18 NOTE — Patient Instructions (Signed)
Medication Instructions:  ?Your physician recommends that you continue on your current medications as directed. Please refer to the Current Medication list given to you today.  ? ?*If you need a refill on your cardiac medications before your next appointment, please call your pharmacy* ? ?Lab Work: ?FASTING LIPID SOON  ? ?If you have labs (blood work) drawn today and your tests are completely normal, you will receive your results only by: ?MyChart Message (if you have MyChart) OR ?A paper copy in the mail ?If you have any lab test that is abnormal or we need to change your treatment, we will call you to review the results. ? ?Testing/Procedures: ?NONE ? ?Follow-Up: ?At [facility], you and your health needs are our priority.  As part of our continuing mission to provide you with exceptional heart care, we have created designated Provider Care Teams.  These Care Teams include your primary Cardiologist (physician) and Advanced Practice Providers (APPs -  Physician Assistants and Nurse Practitioners) who all work together to provide you with the care you need, when you need it. ? ?We recommend signing up for the patient portal called "MyChart".  Sign up information is provided on this After Visit Summary.  MyChart is used to connect with patients for Virtual Visits (Telemedicine).  Patients are able to view lab/test results, encounter notes, upcoming appointments, etc.  Non-urgent messages can be sent to your provider as well.   ?To learn more about what you can do with MyChart, go to [email or web address].   ? ?Your next appointment:   ?12 month(s) ? ?The format for your next appointment:   ?In Person ? ?Provider:   ? ?DR [facility]  ?

## 2021-09-21 ENCOUNTER — Ambulatory Visit (INDEPENDENT_AMBULATORY_CARE_PROVIDER_SITE_OTHER): Payer: Self-pay | Admitting: Nurse Practitioner

## 2021-09-21 ENCOUNTER — Other Ambulatory Visit: Payer: Self-pay

## 2021-09-21 ENCOUNTER — Encounter: Payer: Self-pay | Admitting: Nurse Practitioner

## 2021-09-21 ENCOUNTER — Ambulatory Visit: Payer: PRIVATE HEALTH INSURANCE | Admitting: Nurse Practitioner

## 2021-09-21 VITALS — BP 138/80 | Ht 65.0 in

## 2021-09-21 DIAGNOSIS — Z01419 Encounter for gynecological examination (general) (routine) without abnormal findings: Secondary | ICD-10-CM

## 2021-09-21 NOTE — Progress Notes (Signed)
? ?  Jacqueline Ferrell [birth year] [id number] ? ? ?History:  56 y.o. [id number] presents for annual exam. S/P TAH 2009 for fibroids. Denies menopausal symptoms. Abnormal pap in her 20s, normal since. HLD, HTN managed by PCP. Limited mobility due to back pain.  ? ?Gynecologic History ?Patient's last menstrual period was 02/10/2008. ?  ?Contraception/Family planning: status post hysterectomy ?Sexually active: Yes, declines STD screening ? ?Health Maintenance ?Last Pap: prior to hyst ?Last mammogram: 11/03/2020. Results were: Normal ?Last colonoscopy: Never ?Last Dexa: Not indicated ? ?Past medical history, past surgical history, family history and social history were all reviewed and documented in the EPIC chart. 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  ? ?ROS:  A ROS was performed and pertinent positives and negatives are included. ? ?Exam: ? ?Vitals:  ? 09/21/21 1028  ?BP: 138/80  ?Height: '5\' 5"'$  (1.651 m)  ? ?Body mass index is 45.76 kg/m?. ?Unable to obtain weight d/t to imbalance. ? ?General appearance:  Normal ?Thyroid:  Symmetrical, normal in size, without palpable masses or nodularity. ?Respiratory ? Auscultation:  Clear without wheezing or rhonchi ?Cardiovascular ? Auscultation:  Regular rate, without rubs, murmurs or gallops ? Edema/varicosities:  Not grossly evident ?Abdominal ? Soft,nontender, without masses, guarding or rebound. ? Liver/spleen:  No organomegaly noted ? Hernia:  None appreciated ? Skin ? Inspection:  Grossly normal ?Breasts: Examined lying and sitting.  ? Right: Without masses, retractions, nipple discharge or axillary adenopathy. ? ? Left: Without masses, retractions, nipple discharge or axillary adenopathy. ?Genitourinary  ? Inguinal/mons:  Normal without inguinal adenopathy ? External genitalia:  Normal appearing vulva with no masses, tenderness, or lesions ? BUS/Urethra/Skene's glands:  Normal ? Vagina:  Normal appearing with normal color and discharge, no lesions ? Cervix:  Absent ? Uterus:   Absent ? Adnexa/parametria:   ?  Rt: Normal in size, without masses or tenderness. ?  Lt: Normal in size, without masses or tenderness. ? Anus and perineum: Normal ? Digital rectal exam: Normal sphincter tone without palpated masses or tenderness ? ?Patient informed chaperone available to be present for breast and pelvic exam. Patient has requested no chaperone to be present. Patient has been advised what will be completed during breast and pelvic exam.  ? ?Assessment/Plan:  56 y.o. [id number] for annual exam.  ? ?Well female exam with routine gynecological exam - Education provided on SBEs, importance of preventative screenings, current guidelines, high calcium diet, regular exercise, and multivitamin daily.  Labs with PCP.  ? ?Screening for cervical cancer - Abnormal pap in her 20s, normal since. No longer screening per guidelines.  ? ?Screening for breast cancer - Normal mammogram history.  Continue annual screenings.  Normal breast exam today. ? ?Screening for colon cancer - Has not had screening colonoscopy. Was provided Cologuard but did not complete. Discussed current guidelines and importance of preventative screenings. She will check expiration on Cologuard and if new one is needed she will let us know.  ? ?Return in 1 year for annual.  ? ? ? ?[place], 10:53 AM 09/21/2021 ? ?

## 2021-10-01 ENCOUNTER — Other Ambulatory Visit: Payer: Self-pay | Admitting: Internal Medicine

## 2021-10-01 DIAGNOSIS — I10 Essential (primary) hypertension: Secondary | ICD-10-CM

## 2021-10-02 ENCOUNTER — Other Ambulatory Visit: Payer: Self-pay | Admitting: Internal Medicine

## 2021-10-02 DIAGNOSIS — G4762 Sleep related leg cramps: Secondary | ICD-10-CM

## 2021-10-02 DIAGNOSIS — M48062 Spinal stenosis, lumbar region with neurogenic claudication: Secondary | ICD-10-CM

## 2021-10-27 ENCOUNTER — Encounter: Payer: Self-pay | Admitting: Internal Medicine

## 2021-10-27 ENCOUNTER — Other Ambulatory Visit: Payer: Self-pay | Admitting: Internal Medicine

## 2021-10-27 DIAGNOSIS — M48062 Spinal stenosis, lumbar region with neurogenic claudication: Secondary | ICD-10-CM

## 2021-10-27 MED ORDER — LIDOCAINE 5 % EX PTCH: 1.0000 | MEDICATED_PATCH | CUTANEOUS | 1 refills | Status: DC

## 2021-10-29 ENCOUNTER — Ambulatory Visit: Payer: PRIVATE HEALTH INSURANCE | Admitting: Internal Medicine

## 2021-11-05 ENCOUNTER — Ambulatory Visit: Payer: Self-pay | Admitting: Internal Medicine

## 2022-03-25 ENCOUNTER — Other Ambulatory Visit: Payer: Self-pay | Admitting: Internal Medicine

## 2022-03-25 DIAGNOSIS — M48062 Spinal stenosis, lumbar region with neurogenic claudication: Secondary | ICD-10-CM

## 2022-03-25 DIAGNOSIS — G4762 Sleep related leg cramps: Secondary | ICD-10-CM

## 2022-03-25 MED ORDER — GABAPENTIN 100 MG PO CAPS: 100.0000 mg | ORAL_CAPSULE | Freq: Every day | ORAL | 0 refills | Status: DC

## 2022-05-25 ENCOUNTER — Other Ambulatory Visit: Payer: Self-pay | Admitting: Internal Medicine

## 2022-05-25 DIAGNOSIS — I10 Essential (primary) hypertension: Secondary | ICD-10-CM

## 2022-05-29 ENCOUNTER — Other Ambulatory Visit: Payer: Self-pay | Admitting: Internal Medicine

## 2022-05-29 DIAGNOSIS — M170 Bilateral primary osteoarthritis of knee: Secondary | ICD-10-CM

## 2022-05-29 DIAGNOSIS — M48062 Spinal stenosis, lumbar region with neurogenic claudication: Secondary | ICD-10-CM

## 2022-06-01 ENCOUNTER — Other Ambulatory Visit: Payer: Self-pay | Admitting: Internal Medicine

## 2022-06-01 DIAGNOSIS — E785 Hyperlipidemia, unspecified: Secondary | ICD-10-CM

## 2022-06-01 DIAGNOSIS — I2510 Atherosclerotic heart disease of native coronary artery without angina pectoris: Secondary | ICD-10-CM

## 2022-07-02 ENCOUNTER — Other Ambulatory Visit: Payer: Self-pay | Admitting: Internal Medicine

## 2022-07-02 DIAGNOSIS — G4762 Sleep related leg cramps: Secondary | ICD-10-CM

## 2022-07-02 DIAGNOSIS — M170 Bilateral primary osteoarthritis of knee: Secondary | ICD-10-CM

## 2022-07-02 DIAGNOSIS — I2510 Atherosclerotic heart disease of native coronary artery without angina pectoris: Secondary | ICD-10-CM

## 2022-07-02 DIAGNOSIS — M48062 Spinal stenosis, lumbar region with neurogenic claudication: Secondary | ICD-10-CM

## 2022-07-02 DIAGNOSIS — E785 Hyperlipidemia, unspecified: Secondary | ICD-10-CM

## 2022-07-13 ENCOUNTER — Encounter: Payer: Self-pay | Admitting: Internal Medicine

## 2022-07-13 ENCOUNTER — Other Ambulatory Visit: Payer: Self-pay | Admitting: Internal Medicine

## 2022-07-13 DIAGNOSIS — I2510 Atherosclerotic heart disease of native coronary artery without angina pectoris: Secondary | ICD-10-CM

## 2022-07-13 DIAGNOSIS — E785 Hyperlipidemia, unspecified: Secondary | ICD-10-CM

## 2022-07-13 DIAGNOSIS — I10 Essential (primary) hypertension: Secondary | ICD-10-CM

## 2022-07-13 MED ORDER — TRIAMTERENE-HCTZ 37.5-25 MG PO CAPS: ORAL_CAPSULE | ORAL | 0 refills | Status: DC

## 2022-07-13 MED ORDER — HYDRALAZINE HCL 25 MG PO TABS: 25.0000 mg | ORAL_TABLET | Freq: Three times a day (TID) | ORAL | 0 refills | Status: DC

## 2022-07-13 MED ORDER — ATORVASTATIN CALCIUM 80 MG PO TABS: 80.0000 mg | ORAL_TABLET | Freq: Every day | ORAL | 0 refills | Status: DC

## 2022-07-13 MED ORDER — ASPIRIN 81 MG PO TBEC: 81.0000 mg | DELAYED_RELEASE_TABLET | Freq: Every day | ORAL | 1 refills | Status: DC

## 2022-07-13 MED ORDER — NEBIVOLOL HCL 5 MG PO TABS: 5.0000 mg | ORAL_TABLET | Freq: Every day | ORAL | 0 refills | Status: DC

## 2022-07-27 ENCOUNTER — Other Ambulatory Visit: Payer: Self-pay | Admitting: Internal Medicine

## 2022-07-27 DIAGNOSIS — I10 Essential (primary) hypertension: Secondary | ICD-10-CM

## 2022-07-27 DIAGNOSIS — I2510 Atherosclerotic heart disease of native coronary artery without angina pectoris: Secondary | ICD-10-CM

## 2022-08-02 ENCOUNTER — Encounter: Payer: Self-pay | Admitting: Internal Medicine

## 2022-08-02 ENCOUNTER — Ambulatory Visit (INDEPENDENT_AMBULATORY_CARE_PROVIDER_SITE_OTHER): Payer: PRIVATE HEALTH INSURANCE | Admitting: Internal Medicine

## 2022-08-02 VITALS — BP 138/80 | HR 47 | Temp 98.2°F | Ht 65.0 in

## 2022-08-02 DIAGNOSIS — Z23 Encounter for immunization: Secondary | ICD-10-CM | POA: Diagnosis not present

## 2022-08-02 DIAGNOSIS — R001 Bradycardia, unspecified: Secondary | ICD-10-CM

## 2022-08-02 DIAGNOSIS — I10 Essential (primary) hypertension: Secondary | ICD-10-CM

## 2022-08-02 DIAGNOSIS — M48062 Spinal stenosis, lumbar region with neurogenic claudication: Secondary | ICD-10-CM | POA: Diagnosis not present

## 2022-08-02 DIAGNOSIS — G4762 Sleep related leg cramps: Secondary | ICD-10-CM

## 2022-08-02 DIAGNOSIS — I2510 Atherosclerotic heart disease of native coronary artery without angina pectoris: Secondary | ICD-10-CM | POA: Diagnosis not present

## 2022-08-02 DIAGNOSIS — E785 Hyperlipidemia, unspecified: Secondary | ICD-10-CM

## 2022-08-02 LAB — CBC WITH DIFFERENTIAL/PLATELET
Basophils Absolute: 0 10*3/uL (ref 0.0–0.1)
Basophils Relative: 0.6 % (ref 0.0–3.0)
Eosinophils Absolute: 0 10*3/uL (ref 0.0–0.7)
Eosinophils Relative: 0.5 % (ref 0.0–5.0)
HCT: 42 % (ref 36.0–46.0)
Hemoglobin: 13.8 g/dL (ref 12.0–15.0)
Lymphocytes Relative: 47.6 % — ABNORMAL HIGH (ref 12.0–46.0)
Lymphs Abs: 2.5 10*3/uL (ref 0.7–4.0)
MCHC: 33 g/dL (ref 30.0–36.0)
MCV: 93 fl (ref 78.0–100.0)
Monocytes Absolute: 0.4 10*3/uL (ref 0.1–1.0)
Monocytes Relative: 6.8 % (ref 3.0–12.0)
Neutro Abs: 2.3 10*3/uL (ref 1.4–7.7)
Neutrophils Relative %: 44.5 % (ref 43.0–77.0)
Platelets: 216 10*3/uL (ref 150.0–400.0)
RBC: 4.52 Mil/uL (ref 3.87–5.11)
RDW: 13.8 % (ref 11.5–15.5)
WBC: 5.2 10*3/uL (ref 4.0–10.5)

## 2022-08-02 LAB — HEPATIC FUNCTION PANEL
ALT: 12 U/L (ref 0–35)
AST: 16 U/L (ref 0–37)
Albumin: 4.3 g/dL (ref 3.5–5.2)
Alkaline Phosphatase: 66 U/L (ref 39–117)
Bilirubin, Direct: 0.1 mg/dL (ref 0.0–0.3)
Total Bilirubin: 0.5 mg/dL (ref 0.2–1.2)
Total Protein: 7.4 g/dL (ref 6.0–8.3)

## 2022-08-02 LAB — LIPID PANEL
Cholesterol: 247 mg/dL — ABNORMAL HIGH (ref 0–200)
HDL: 42.3 mg/dL (ref >39.00)
LDL Cholesterol: 189 mg/dL — ABNORMAL HIGH (ref 0–99)
NonHDL: 204.7
Total CHOL/HDL Ratio: 6
Triglycerides: 80 mg/dL (ref 0.0–149.0)
VLDL: 16 mg/dL (ref 0.0–40.0)

## 2022-08-02 LAB — TSH: TSH: 1.21 u[IU]/mL (ref 0.35–5.50)

## 2022-08-02 LAB — URINALYSIS, ROUTINE W REFLEX MICROSCOPIC
Bilirubin Urine: NEGATIVE
Hgb urine dipstick: NEGATIVE
Ketones, ur: NEGATIVE
Leukocytes,Ua: NEGATIVE
Nitrite: NEGATIVE
Specific Gravity, Urine: 1.02 (ref 1.000–1.030)
Total Protein, Urine: NEGATIVE
Urine Glucose: NEGATIVE
Urobilinogen, UA: 0.2 (ref 0.0–1.0)
pH: 6.5 (ref 5.0–8.0)

## 2022-08-02 LAB — BASIC METABOLIC PANEL
BUN: 22 mg/dL (ref 6–23)
CO2: 29 mEq/L (ref 19–32)
Calcium: 9.7 mg/dL (ref 8.4–10.5)
Chloride: 103 mEq/L (ref 96–112)
Creatinine, Ser: 0.83 mg/dL (ref 0.40–1.20)
GFR: 79.35 mL/min (ref >60.00)
Glucose, Bld: 81 mg/dL (ref 70–99)
Potassium: 3.6 mEq/L (ref 3.5–5.1)
Sodium: 142 mEq/L (ref 135–145)

## 2022-08-02 LAB — MAGNESIUM: Magnesium: 1.8 mg/dL (ref 1.5–2.5)

## 2022-08-02 LAB — CK: Total CK: 173 U/L (ref 7–177)

## 2022-08-02 IMAGING — MR MR LUMBAR SPINE WO/W CM
4 of 9 series · 18 of 48 positions shown · IV contrast (Multihance 20cc)
Comparison: Lumbar spine MRI 06/24/2017

CLINICAL DATA: Lower back pain

EXAM:
MRI LUMBAR SPINE WITHOUT AND WITH CONTRAST
TECHNIQUE: Multiplanar and multiecho pulse sequences of the lumbar spine were
obtained without and with intravenous contrast.
CONTRAST:  20mL MULTIHANCE GADOBENATE DIMEGLUMINE 529 MG/ML IV SOLN

[Series 5: T1 · sagittal · 4.0mm · 0.73mm/px · 3 of 15 slices shown (1 of 2)]
[im 1/15]
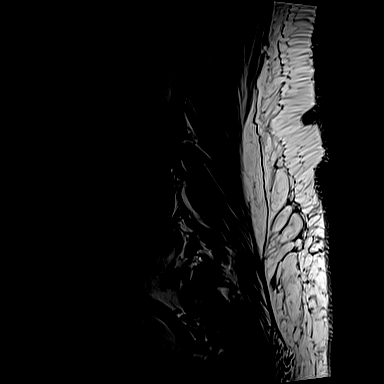
[im 8/15]
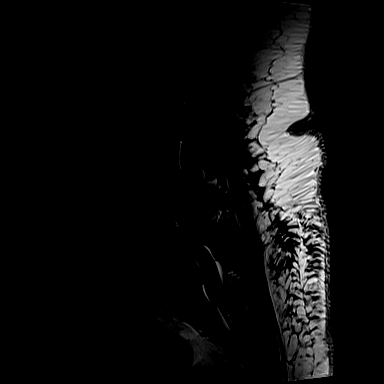
[im 15/15]
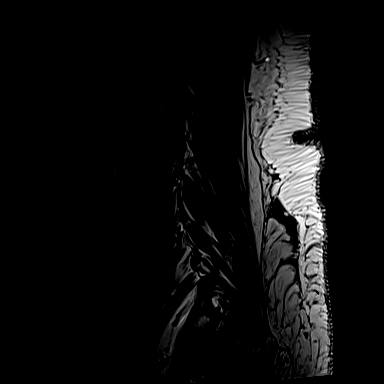

[Series 7: T1 · axial · 4.0mm · 0.35mm/px · z∈[+104,+169]mm · 3 of 14 slices shown (2 of 2)]
[im 1/14]
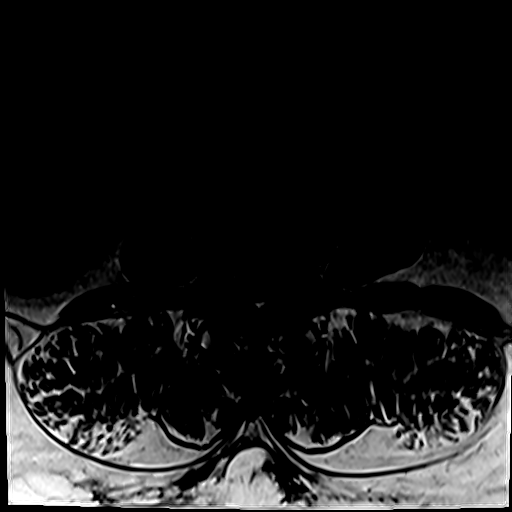
[im 9/14]
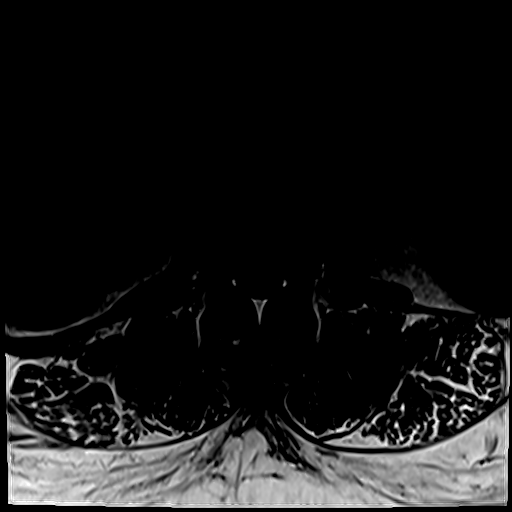
[im 14/14]
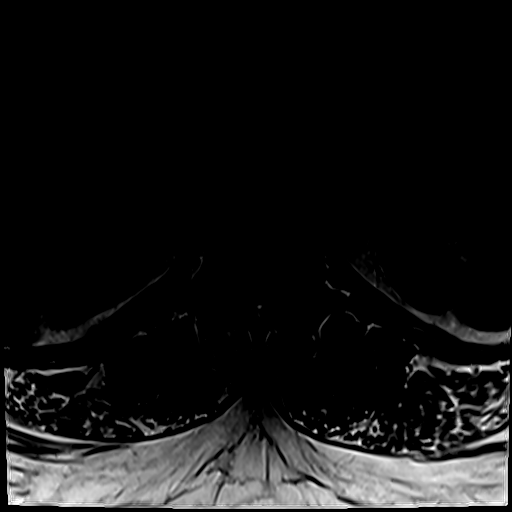

[Series 11: T2 · axial · 4.0mm · 0.28mm/px · z∈[-12,+169]mm · 8 of 34 slices shown (1 of 2)]
[im 1/34]
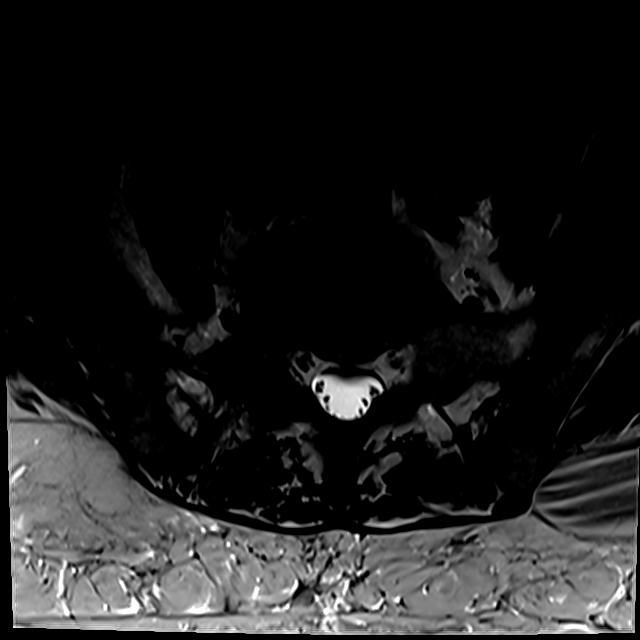
[im 4/34]
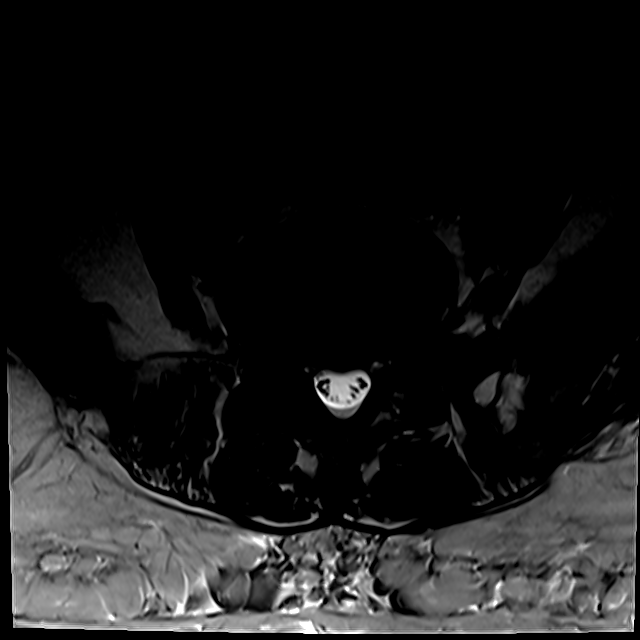
[im 12/34]
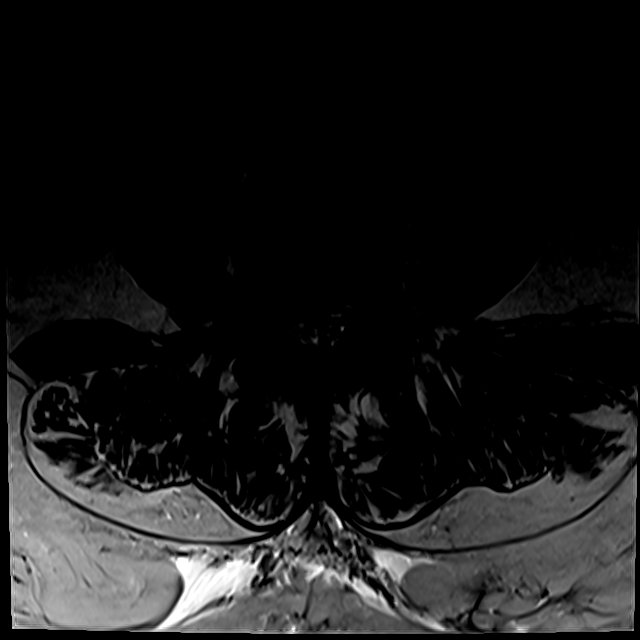
[im 15/34]
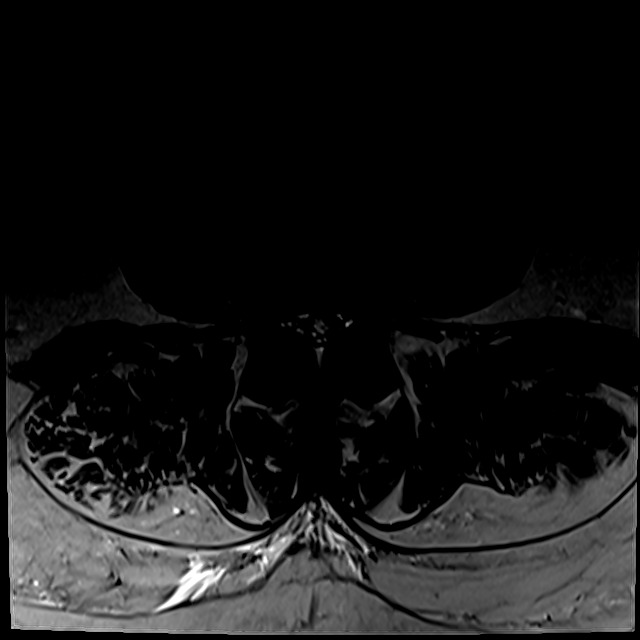
[im 19/34]
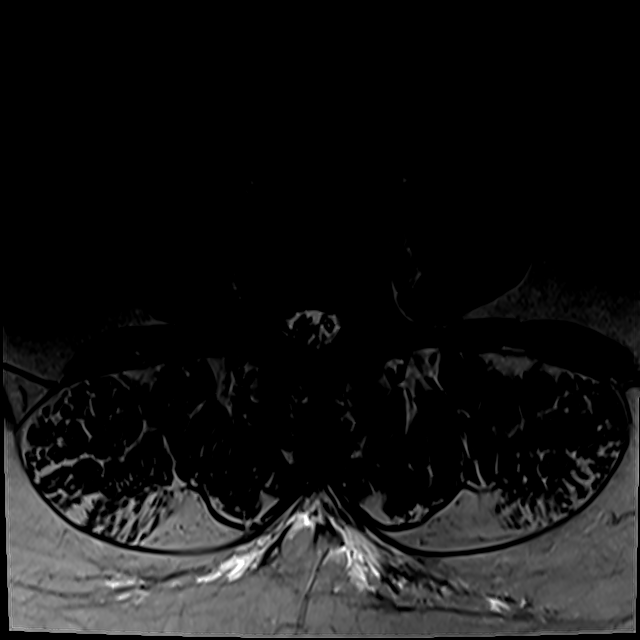
[im 23/34]
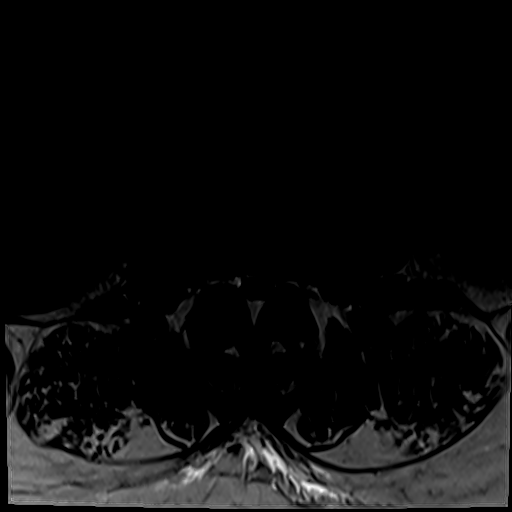
[im 30/34]
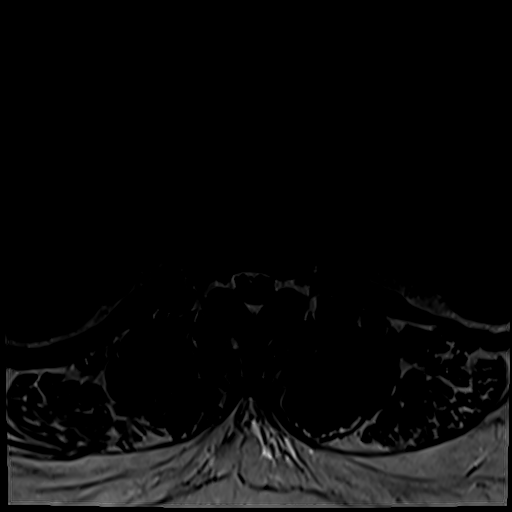
[im 34/34]
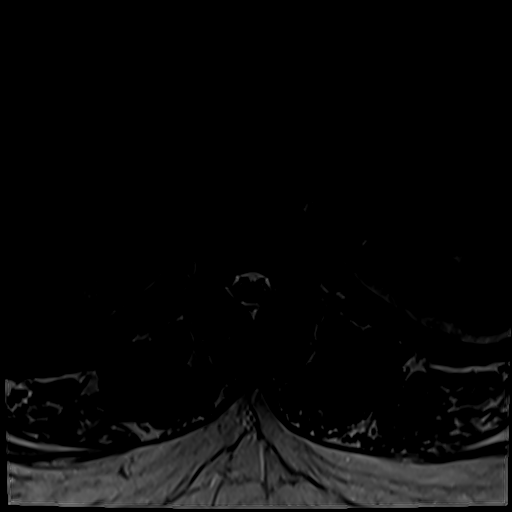

[Series 12: T2 · sagittal · 4.0mm · 0.73mm/px · 4 of 15 slices shown (2 of 2)]
[im 1/15]
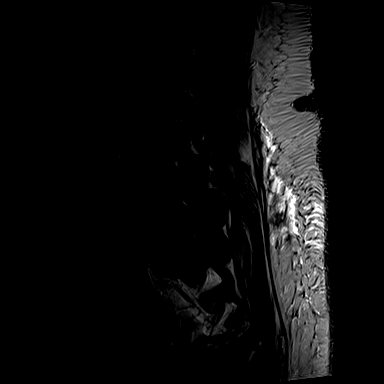
[im 5/15]
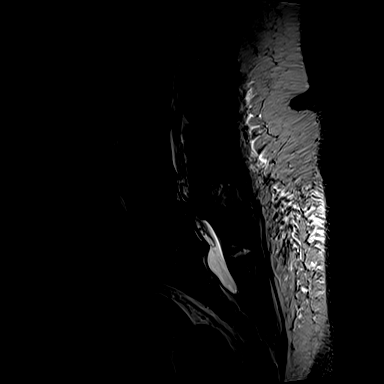
[im 10/15]
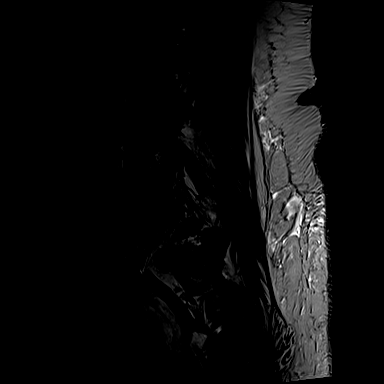
[im 15/15]
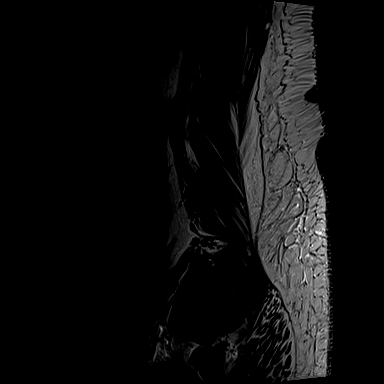

[18 of 48 positions shown; findings below may reference images not displayed]

FINDINGS: Segmentation:  The lowest formed disc space is designated L5-S1.

Alignment: There is 6 mm anterolisthesis of L4 on L5 and 4 mm
retrolisthesis of L5 on S1, not significantly changed since 7378.
Alignment at the other levels is normal.

Vertebrae: There is degenerative endplate marrow signal abnormality
at L4-5 and L5-S1. There is no suspicious marrow signal abnormality.
There is no abnormal marrow enhancement.

Conus medullaris and cauda equina: Conus extends to the L1-L2 level.
Conus and cauda equina appear normal.

Paraspinal and other soft tissues: Multiple T2 hyperintense lesions
in the kidneys likely reflects cysts. The paraspinal soft tissues
are unremarkable.

Disc levels:

There is multilevel facet arthropathy, most advanced at L4-L5 and
L5-S1. There are trace effusions at L5-S1.

There is congenital narrowing of the lumbar canal throughout.

T12-L1: Mild congenital canal stenosis. No significant neural
foraminal stenosis

L1-L2: Mild congenital canal stenosis. No significant neural
foraminal stenosis.

L2-L3: There is a minimal disc bulge and bilateral facet arthropathy
superimposed on congenital canal stenosis resulting in mild to
moderate spinal canal stenosis without significant neural foraminal
stenosis. Findings are overall not significantly changed.

L3-L4: There is a diffuse disc bulge with a left
foraminal/extraforaminal component, ligamentum flavum thickening,
and bilateral facet arthropathy resulting in moderate spinal canal
stenosis with crowding of the subarticular zones, left worse than
right, and moderate left and mild right neural foraminal stenosis.
Findings are overall not significantly changed

L4-L5: There is uncovering of the disc posteriorly with a mild
superimposed bulge, ligamentum flavum thickening, degenerative
endplate change, and bilateral facet arthropathy resulting in severe
spinal canal stenosis with compression of the cauda equina nerve
roots and moderate to severe left and moderate right neural
foraminal stenosis. Findings are not significantly changed.

L5-S1: There is a prominent diffuse disc bulge, degenerative
endplate change, and bilateral facet arthropathy resulting in mild
spinal canal stenosis with crowding of the bilateral subarticular
zones and possible mass effect on the traversing S1 nerve roots and
moderate to severe right worse than left neural foraminal stenosis.
Findings are overall not significantly changed.

L1-L2: No significant spinal canal or neural foraminal stenosis.
IMPRESSION: 1. Multilevel spondylosis and spondylolisthesis are overall not
significantly changed compared to the study from 7378.
2. Severe spinal canal stenosis with compression of the cauda equina
nerve roots at L4-L5, and moderate spinal canal stenosis at L2-L3
and L3-L4 due to above described degenerative changes superimposed
on congenital narrowing of the spinal canal.
3. Moderate left neural foraminal stenosis at L3-L4, severe left and
moderate right neural foraminal stenosis at L4-L5, and severe right
worse than left neural foraminal stenosis at L5-S1.
4. Multilevel facet arthropathy, most advanced at L4-L5. Trace
bilateral facet joint effusions at L5-S1.

## 2022-08-02 MED ORDER — GABAPENTIN 100 MG PO CAPS: 100.0000 mg | ORAL_CAPSULE | Freq: Every day | ORAL | 1 refills | Status: DC

## 2022-08-02 MED ORDER — ATORVASTATIN CALCIUM 80 MG PO TABS: 80.0000 mg | ORAL_TABLET | Freq: Every day | ORAL | 1 refills | Status: DC

## 2022-08-02 MED ORDER — TIZANIDINE HCL 4 MG PO TABS: 4.0000 mg | ORAL_TABLET | Freq: Every day | ORAL | 1 refills | Status: DC

## 2022-08-02 NOTE — Patient Instructions (Signed)
Bradycardia, Adult Bradycardia is a slower-than-normal heartbeat. A normal resting heart rate for an adult ranges from 60 to 100 beats per minute. With bradycardia, the resting heart rate is less than 60 beats per minute. Bradycardia can prevent enough oxygen from reaching certain areas of your body when you are active. It can be serious if it keeps enough oxygen from reaching your brain and other parts of your body. Bradycardia is not a problem for everyone. For some healthy adults, a slow resting heart rate is normal. What are the causes? This condition may be caused by: A problem with the heart, including: A problem with the heart's electrical system, such as a heart block. With a heart block, electrical signals between the chambers of the heart are partially or completely blocked, so they are not able to work as they should. A problem with the heart's natural pacemaker (sinus node). Heart disease. A heart attack. Heart damage. Lyme disease. A heart infection. A heart condition that is present at birth (congenital heart defect). Certain medicines that treat heart conditions. Certain conditions, such as hypothyroidism and obstructive sleep apnea. Problems with the balance of chemicals and other substances, like potassium, in the blood. Trauma. Radiation therapy. What increases the risk? You are more likely to develop this condition if you: Are age 65 or older. Have high blood pressure (hypertension), high cholesterol (hyperlipidemia), or diabetes. Drink heavily, use tobacco or nicotine products, or use drugs. What are the signs or symptoms? Symptoms of this condition include: Light-headedness. Feeling faint or fainting. Fatigue and weakness. Trouble with activity or exercise. Shortness of breath. Chest pain (angina). Drowsiness. Confusion. Dizziness. How is this diagnosed? This condition may be diagnosed based on: Your symptoms. Your medical history. A physical exam. During  the exam, your health care provider will listen to your heartbeat and check your pulse. To confirm the diagnosis, your health care provider may order tests, such as: Blood tests. An electrocardiogram (ECG). This test records the heart's electrical activity. The test can show how fast your heart is beating and whether the heartbeat is steady. A test in which you wear a portable device (event recorder or Holter monitor) to record your heart's electrical activity while you go about your day. An exercise test. How is this treated? Treatment for this condition depends on the cause of the condition and how severe your symptoms are. Treatment may involve: Treatment of the underlying condition. Changing your medicines or how much medicine you take. Having a small, battery-operated device called a pacemaker implanted under the skin. When bradycardia occurs, this device can be used to increase your heart rate and help your heart beat in a regular rhythm. Follow these instructions at home: Lifestyle Manage any health conditions that contribute to bradycardia as told by your health care provider. Follow a heart-healthy diet. A nutrition specialist (dietitian) can help educate you about healthy food options and changes. Follow an exercise program that is approved by your health care provider. Maintain a healthy weight. Try to reduce or manage your stress, such as with yoga or meditation. If you need help reducing stress, ask your health care provider. Do not use any products that contain nicotine or tobacco. These products include cigarettes, chewing tobacco, and vaping devices, such as e-cigarettes. If you need help quitting, ask your health care provider. Do not use illegal drugs. Alcohol use If you drink alcohol: Limit how much you have to: 0-1 drink a day for women who are not pregnant. 0-2 drinks a day   for men. Know how much alcohol is in a drink. In the [place] one drink equals one 12 oz bottle of  beer (355 mL), one 5 oz glass of wine (148 mL), or one 1 oz glass of hard liquor (44 mL). General instructions Take over-the-counter and prescription medicines only as told by your health care provider. Keep all follow-up visits. This is important. How is this prevented? In some cases, bradycardia may be prevented by: Treating underlying medical problems. Stopping behaviors or medicines that can trigger the condition. Contact a health care provider if: You feel light-headed or dizzy. You almost faint. You feel weak or are easily fatigued during physical activity. You experience confusion or have memory problems. Get help right away if: You faint. You have chest pains or an irregular heartbeat (palpitations). You have trouble breathing. These symptoms may represent a serious problem that is an emergency. Do not wait to see if the symptoms will go away. Get medical help right away. Call your local emergency services (911 in the [place] Do not drive yourself to the hospital. Summary Bradycardia is a slower-than-normal heartbeat. With bradycardia, the resting heart rate is less than 60 beats per minute. Treatment for this condition depends on the cause. Manage any health conditions that contribute to bradycardia as told by your health care provider. Do not use any products that contain nicotine or tobacco. These products include cigarettes, chewing tobacco, and vaping devices, such as e-cigarettes. Keep all follow-up visits. This is important. This information is not intended to replace advice given to you by your health care provider. Make sure you discuss any questions you have with your health care provider. Document Revised: 10/19/2020 Document Reviewed: 10/19/2020 Elsevier Patient Education  2023 [employer] Inc[employer]  

## 2022-08-02 NOTE — Progress Notes (Unsigned)
Subjective:  Patient ID: Jacqueline Ferrell, female    DOB: 15-Aug-1965  Age: 57 y.o. MRN: [id number]  CC: No chief complaint on file.   HPI Jacqueline Ferrell presents for ***  Outpatient Medications Prior to Visit  Medication Sig Dispense Refill   acetaminophen (TYLENOL) 500 MG tablet Take 1,000 mg by mouth every 12 (twelve) hours as needed for moderate pain.     aspirin EC 81 MG tablet Take 1 tablet (81 mg total) by mouth daily. Swallow whole. 90 tablet 1   celecoxib (CELEBREX) 50 MG capsule Take 1 capsule (50 mg total) by mouth 2 (two) times daily. 180 capsule 1   Cholecalciferol 50 MCG (2000 UT) TABS Take 1 tablet (2,000 Units total) by mouth daily. 90 tablet 1   hydrALAZINE (APRESOLINE) 25 MG tablet TAKE 1 TABLET BY MOUTH THREE TIMES A DAY 90 tablet 0   KLOR-CON M20 20 MEQ tablet TAKE 1 TABLET BY MOUTH TWICE A DAY 60 tablet 2   Magnesium Oxide 250 MG TABS Take 1 tablet (250 mg total) by mouth at bedtime. 90 tablet 1   triamterene-hydrochlorothiazide (DYAZIDE) 37.5-25 MG capsule TAKE 1 CAPSULE BY MOUTH EVERY DAY 30 capsule 0   atorvastatin (LIPITOR) 80 MG tablet Take 1 tablet (80 mg total) by mouth daily. 30 tablet 0   gabapentin (NEURONTIN) 100 MG capsule Take 1 capsule (100 mg total) by mouth at bedtime. 90 capsule 0   nebivolol (BYSTOLIC) 5 MG tablet Take 1 tablet (5 mg total) by mouth daily. 30 tablet 0   tiZANidine (ZANAFLEX) 4 MG tablet TAKE 1 TABLET BY MOUTH AT BEDTIME. 90 tablet 1   lidocaine (LIDODERM) 5 % Place 1 patch onto the skin daily. Remove & Discard patch within 12 hours or as directed by MD 90 patch 1   No facility-administered medications prior to visit.    ROS Review of Systems  Gastrointestinal:  Negative for abdominal pain.  Musculoskeletal:  Positive for arthralgias and back pain. Negative for myalgias.    Objective:  BP 138/80 (BP Location: Right Arm, Patient Position: Sitting, Cuff Size: Large)   Pulse (!) 47   Temp 98.2 F (36.8 C) (Oral)   Ht '5\' 5"'$   (1.651 m)   LMP 02/10/2008   SpO2 99%   BMI 45.76 kg/m   BP Readings from Last 3 Encounters:  08/02/22 138/80  09/21/21 138/80  09/18/21 140/78    Wt Readings from Last 3 Encounters:  09/18/21 275 lb (124.7 kg)  06/30/20 265 lb (120.2 kg)  02/27/20 255 lb (115.7 kg)    Physical Exam Cardiovascular:     Rate and Rhythm: Regular rhythm. Bradycardia present.     Heart sounds: Normal heart sounds, S1 normal and S2 normal. No murmur heard.    No gallop.     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     Left lower leg: Pitting Edema (trace pitting) present.     Lab Results  Component Value Date   WBC 5.2 08/02/2022   HGB 13.8 08/02/2022   HCT 42.0 08/02/2022   PLT 216.0 08/02/2022   GLUCOSE 81 08/02/2022   CHOL 247 (H) 08/02/2022   TRIG 80.0 08/02/2022   HDL 42.30 08/02/2022   LDLCALC 189 (H) 08/02/2022   ALT 12 08/02/2022   AST 16 08/02/2022   NA 142 08/02/2022   K 3.6 08/02/2022   CL 103  08/02/2022   CREATININE 0.83 08/02/2022   BUN 22 08/02/2022   CO2 29 08/02/2022   TSH 1.21 08/02/2022   INR 1.1 (H) 09/11/2010   HGBA1C 5.5 01/11/2019    MR Lumbar Spine W Wo Contrast  Result Date: 04/29/2021 CLINICAL DATA:  Lower back pain EXAM: MRI LUMBAR SPINE WITHOUT AND WITH CONTRAST TECHNIQUE: Multiplanar and multiecho pulse sequences of the lumbar spine were obtained without and with intravenous contrast. CONTRAST:  45m MULTIHANCE GADOBENATE DIMEGLUMINE 529 MG/ML IV SOLN COMPARISON:  Lumbar spine MRI 06/24/2017 FINDINGS: Segmentation:  The lowest formed disc space is designated L5-S1. Alignment: There is 6 mm anterolisthesis of L4 on L5 and 4 mm retrolisthesis of L5 on S1, not significantly changed since 2018. Alignment at the other levels is normal. Vertebrae: There is degenerative endplate marrow signal abnormality at L4-5 and L5-S1. There is no suspicious marrow signal  abnormality. There is no abnormal marrow enhancement. Conus medullaris and cauda equina: Conus extends to the L1-L2 level. Conus and cauda equina appear normal. Paraspinal and other soft tissues: Multiple T2 hyperintense lesions in the kidneys likely reflects cysts. The paraspinal soft tissues are unremarkable. Disc levels: There is multilevel facet arthropathy, most advanced at L4-L5 and L5-S1. There are trace effusions at L5-S1. 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 T12-L1: Mild congenital canal stenosis. No significant neural foraminal stenosis L1-L2: Mild congenital canal stenosis. No significant neural foraminal stenosis. L2-L3: There is a minimal disc bulge and bilateral facet arthropathy superimposed on congenital canal stenosis resulting in mild to moderate spinal canal stenosis without significant neural foraminal stenosis. Findings are overall not significantly changed. L3-L4: There is a diffuse disc bulge with a left foraminal/extraforaminal component, ligamentum flavum thickening, and bilateral facet arthropathy resulting in moderate spinal canal stenosis with crowding of the subarticular zones, left worse than right, and moderate left and mild right neural foraminal stenosis. Findings are overall not significantly changed L4-L5: There is uncovering of the disc posteriorly with a mild superimposed bulge, ligamentum flavum thickening, degenerative endplate change, and bilateral facet arthropathy resulting in severe spinal canal stenosis with compression of the cauda equina nerve roots and moderate to severe left and moderate right neural foraminal stenosis. Findings are not significantly changed. L5-S1: There is a prominent diffuse disc bulge, degenerative endplate change, and bilateral facet arthropathy resulting in mild spinal canal stenosis with crowding of the bilateral subarticular zones and possible mass effect on the traversing S1 nerve roots and moderate to severe right  worse than left neural foraminal stenosis. Findings are overall not significantly changed. L1-L2: No significant spinal canal or neural foraminal stenosis. IMPRESSION: 1. Multilevel spondylosis and spondylolisthesis are overall not significantly changed compared to the study from 2018. 2. Severe spinal canal stenosis with compression of the cauda equina nerve roots at L4-L5, and moderate spinal canal stenosis at L2-L3 and L3-L4 due to above described degenerative changes superimposed on congenital narrowing of the spinal canal. 3. Moderate left neural foraminal stenosis at L3-L4, severe left and moderate right neural foraminal stenosis at L4-L5, and severe right worse than left neural foraminal stenosis at L5-S1. 4. Multilevel facet arthropathy, most advanced at L4-L5. Trace bilateral facet joint effusions at L5-S1. Electronically Signed   By: PValetta MoleM.D.   On: 04/29/2021 11:45    Assessment & Plan:   Diagnoses and all orders for this visit:  Bradycardia -     EKG 12-Lead -     TSH; Future -     Ambulatory referral to  Cardiology -     TSH  Spinal stenosis, lumbar region, with neurogenic claudication -     gabapentin (NEURONTIN) 100 MG capsule; Take 1 capsule (100 mg total) by mouth at bedtime. -     tiZANidine (ZANAFLEX) 4 MG tablet; Take 1 tablet (4 mg total) by mouth at bedtime.  Nocturnal leg cramps -     gabapentin (NEURONTIN) 100 MG capsule; Take 1 capsule (100 mg total) by mouth at bedtime. -     tiZANidine (ZANAFLEX) 4 MG tablet; Take 1 tablet (4 mg total) by mouth at bedtime. -     Magnesium; Future -     CK; Future -     CK -     Magnesium  Primary hypertension -     EKG 12-Lead -     Basic metabolic panel; Future -     TSH; Future -     CBC with Differential/Platelet; Future -     Urinalysis, Routine w reflex microscopic; Future -     Urinalysis, Routine w reflex microscopic -     CBC with Differential/Platelet -     TSH -     Basic metabolic panel  Hyperlipidemia  with target LDL less than 70 -     atorvastatin (LIPITOR) 80 MG tablet; Take 1 tablet (80 mg total) by mouth daily. -     Lipid panel; Future -     TSH; Future -     Hepatic function panel; Future -     CK; Future -     CK -     Hepatic function panel -     TSH -     Lipid panel  Atherosclerosis of native coronary artery of native heart without angina pectoris -     atorvastatin (LIPITOR) 80 MG tablet; Take 1 tablet (80 mg total) by mouth daily. -     Lipid panel; Future -     Ambulatory referral to Cardiology -     Lipid panel  Other orders -     Flu Vaccine QUAD 6+ mos PF IM (Fluarix Quad PF)   I have discontinued Denyce Robert. Du[place]is's lidocaine and nebivolol. I have also changed her tiZANidine. Additionally, I am having her maintain her acetaminophen, Magnesium Oxide, celecoxib, Cholecalciferol, Klor-Con M20, triamterene-hydrochlorothiazide, aspirin EC, hydrALAZINE, gabapentin, and atorvastatin.  Meds ordered this encounter  Medications   gabapentin (NEURONTIN) 100 MG capsule    Sig: Take 1 capsule (100 mg total) by mouth at bedtime.    Dispense:  90 capsule    Refill:  1   tiZANidine (ZANAFLEX) 4 MG tablet    Sig: Take 1 tablet (4 mg total) by mouth at bedtime.    Dispense:  90 tablet    Refill:  1   atorvastatin (LIPITOR) 80 MG tablet    Sig: Take 1 tablet (80 mg total) by mouth daily.    Dispense:  90 tablet    Refill:  1     Follow-up: Return in about 3 months (around 11/01/2022).  Scarlette Calico, MD

## 2022-08-04 ENCOUNTER — Encounter: Payer: Self-pay | Admitting: Internal Medicine

## 2022-08-17 ENCOUNTER — Ambulatory Visit: Payer: PRIVATE HEALTH INSURANCE | Admitting: Internal Medicine

## 2022-08-19 ENCOUNTER — Other Ambulatory Visit: Payer: Self-pay | Admitting: Internal Medicine

## 2022-08-19 DIAGNOSIS — M48062 Spinal stenosis, lumbar region with neurogenic claudication: Secondary | ICD-10-CM

## 2022-08-19 DIAGNOSIS — M170 Bilateral primary osteoarthritis of knee: Secondary | ICD-10-CM

## 2022-09-09 ENCOUNTER — Encounter: Payer: Self-pay | Admitting: Nurse Practitioner

## 2022-09-23 ENCOUNTER — Ambulatory Visit: Payer: Self-pay | Admitting: Nurse Practitioner

## 2022-10-01 ENCOUNTER — Other Ambulatory Visit: Payer: Self-pay | Admitting: Internal Medicine

## 2022-10-01 DIAGNOSIS — E785 Hyperlipidemia, unspecified: Secondary | ICD-10-CM

## 2022-10-01 DIAGNOSIS — I2510 Atherosclerotic heart disease of native coronary artery without angina pectoris: Secondary | ICD-10-CM

## 2022-10-03 ENCOUNTER — Other Ambulatory Visit: Payer: Self-pay | Admitting: Internal Medicine

## 2022-10-03 DIAGNOSIS — I10 Essential (primary) hypertension: Secondary | ICD-10-CM

## 2022-10-22 ENCOUNTER — Encounter: Payer: Self-pay | Admitting: Cardiology

## 2022-11-19 ENCOUNTER — Other Ambulatory Visit (HOSPITAL_COMMUNITY): Payer: Self-pay

## 2022-11-22 ENCOUNTER — Other Ambulatory Visit: Payer: Self-pay | Admitting: Internal Medicine

## 2022-11-22 DIAGNOSIS — E785 Hyperlipidemia, unspecified: Secondary | ICD-10-CM

## 2022-11-22 DIAGNOSIS — I2510 Atherosclerotic heart disease of native coronary artery without angina pectoris: Secondary | ICD-10-CM

## 2022-11-24 ENCOUNTER — Encounter: Payer: Self-pay | Admitting: Nurse Practitioner

## 2022-11-24 ENCOUNTER — Ambulatory Visit (INDEPENDENT_AMBULATORY_CARE_PROVIDER_SITE_OTHER): Payer: Self-pay | Admitting: Nurse Practitioner

## 2022-11-24 VITALS — BP 138/86 | Ht 65.0 in | Wt 265.0 lb

## 2022-11-24 DIAGNOSIS — Z01419 Encounter for gynecological examination (general) (routine) without abnormal findings: Secondary | ICD-10-CM

## 2022-11-24 DIAGNOSIS — F4321 Adjustment disorder with depressed mood: Secondary | ICD-10-CM

## 2022-11-24 DIAGNOSIS — Z780 Asymptomatic menopausal state: Secondary | ICD-10-CM

## 2022-11-24 DIAGNOSIS — Z1211 Encounter for screening for malignant neoplasm of colon: Secondary | ICD-10-CM

## 2022-11-24 DIAGNOSIS — Z634 Disappearance and death of family member: Secondary | ICD-10-CM

## 2022-11-24 NOTE — Progress Notes (Signed)
   Jacqueline Ferrell 09/22/1965 [id number]   History:  57 y.o. [id number] presents for annual exam. S/P TAH 2009 for fibroids. Denies menopausal symptoms. Abnormal pap in her 20s, normal since. HLD, HTN managed by PCP. Limited mobility due to back pain. Daughter passed away in 09/20/22 from complications of diabetes.   Gynecologic History Patient's last menstrual period was 02/10/2008.   Contraception/Family planning: status post hysterectomy  Health Maintenance Last Pap: prior to hyst Last mammogram: 11/03/2020. Results were: Normal Last colonoscopy: Never Last Dexa: Not indicated  Past medical history, past surgical history, family history and social history were all reviewed and documented in the EPIC chart. 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   ROS:  A ROS was performed and pertinent positives and negatives are included.  Exam:  Vitals:   11/24/22 0911  BP: 138/86  Weight: 265 lb (120.2 kg)  Height: 5\' 5"  (1.651 m)    Body mass index is 44.1 kg/m. Unable to obtain weight d/t to imbalance.  General appearance:  Normal, tearful Thyroid:  Symmetrical, normal in size, without palpable masses or nodularity. Respiratory  Auscultation:  Clear without wheezing or rhonchi Cardiovascular  Auscultation:  Regular rate, without rubs, murmurs or gallops  Edema/varicosities:  Not grossly evident Abdominal  Soft,nontender, without masses, guarding or rebound.  Liver/spleen:  No organomegaly noted  Hernia:  None appreciated  Skin  Inspection:  Grossly normal Breasts: Examined lying and sitting.   Right: Without masses, retractions, nipple discharge or axillary adenopathy.   Left: Without masses, retractions, nipple discharge or axillary adenopathy. Genitourinary   Inguinal/mons:  Normal without inguinal adenopathy  External genitalia:  Normal appearing vulva with no masses, tenderness, or lesions  BUS/Urethra/Skene's glands:  Normal  Vagina:  Normal appearing with  normal color and discharge, no lesions  Cervix:  Absent  Uterus:  Absent  Adnexa/parametria:     Rt: Normal in size, without masses or tenderness.   Lt: Normal in size, without masses or tenderness.  Anus and perineum: Normal  Digital rectal exam: Deferred  Patient informed chaperone available to be present for breast and pelvic exam. Patient has requested no chaperone to be present. Patient has been advised what will be completed during breast and pelvic exam.   Assessment/Plan:  57 y.o. [id number] for annual exam.   Well female exam with routine gynecological exam - Education provided on SBEs, importance of preventative screenings, current guidelines, high calcium diet, regular exercise, and multivitamin daily.  Labs with PCP.   Screening for colon cancer - Plan: Cologuard. Has not had screening colonoscopy. Was provided Cologuard but did not complete. Discussed current guidelines and importance of preventative screenings.   Postmenopausal - no HRT. S/P TAH 2009 for fibroids. Denies menopausal symptoms.  Grief at loss of child - very emotional during visit. Recommend grief counseling. She saw them briefly after her daughter passed and will call today.   Screening for cervical cancer - Abnormal pap in her 20s, normal since. No longer screening per guidelines.   Screening for breast cancer - Normal mammogram history. Overdue and encouraged to schedule now. Normal breast exam today.  Screening for osteoporosis - Average risk. Will plan DXA at age 57.   Return in 1 year for annual.     Olivia Mackie DNP, 9:41 AM 11/24/2022

## 2022-12-01 ENCOUNTER — Other Ambulatory Visit: Payer: Self-pay | Admitting: Internal Medicine

## 2022-12-01 DIAGNOSIS — G4762 Sleep related leg cramps: Secondary | ICD-10-CM

## 2022-12-01 DIAGNOSIS — M48062 Spinal stenosis, lumbar region with neurogenic claudication: Secondary | ICD-10-CM

## 2023-02-05 ENCOUNTER — Other Ambulatory Visit: Payer: Self-pay | Admitting: Internal Medicine

## 2023-02-05 DIAGNOSIS — I10 Essential (primary) hypertension: Secondary | ICD-10-CM

## 2023-02-10 ENCOUNTER — Other Ambulatory Visit: Payer: Self-pay | Admitting: Internal Medicine

## 2023-02-10 DIAGNOSIS — M48062 Spinal stenosis, lumbar region with neurogenic claudication: Secondary | ICD-10-CM

## 2023-02-10 DIAGNOSIS — G4762 Sleep related leg cramps: Secondary | ICD-10-CM

## 2023-03-09 ENCOUNTER — Other Ambulatory Visit: Payer: Self-pay | Admitting: Internal Medicine

## 2023-03-09 DIAGNOSIS — I10 Essential (primary) hypertension: Secondary | ICD-10-CM

## 2023-03-09 DIAGNOSIS — I2510 Atherosclerotic heart disease of native coronary artery without angina pectoris: Secondary | ICD-10-CM

## 2023-05-10 ENCOUNTER — Encounter: Payer: Self-pay | Admitting: Internal Medicine

## 2023-05-10 ENCOUNTER — Ambulatory Visit (INDEPENDENT_AMBULATORY_CARE_PROVIDER_SITE_OTHER): Payer: Self-pay | Admitting: Internal Medicine

## 2023-05-10 VITALS — BP 162/88 | HR 66 | Temp 97.9°F | Resp 16 | Ht 65.0 in | Wt 264.0 lb

## 2023-05-10 DIAGNOSIS — M48062 Spinal stenosis, lumbar region with neurogenic claudication: Secondary | ICD-10-CM

## 2023-05-10 DIAGNOSIS — Z23 Encounter for immunization: Secondary | ICD-10-CM

## 2023-05-10 DIAGNOSIS — R600 Localized edema: Secondary | ICD-10-CM

## 2023-05-10 DIAGNOSIS — I10 Essential (primary) hypertension: Secondary | ICD-10-CM

## 2023-05-10 DIAGNOSIS — F322 Major depressive disorder, single episode, severe without psychotic features: Secondary | ICD-10-CM

## 2023-05-10 DIAGNOSIS — E785 Hyperlipidemia, unspecified: Secondary | ICD-10-CM

## 2023-05-10 DIAGNOSIS — M170 Bilateral primary osteoarthritis of knee: Secondary | ICD-10-CM

## 2023-05-10 DIAGNOSIS — I2510 Atherosclerotic heart disease of native coronary artery without angina pectoris: Secondary | ICD-10-CM

## 2023-05-10 LAB — BRAIN NATRIURETIC PEPTIDE: Pro B Natriuretic peptide (BNP): 6 pg/mL (ref 0.0–100.0)

## 2023-05-10 LAB — CBC WITH DIFFERENTIAL/PLATELET
Basophils Absolute: 0 10*3/uL (ref 0.0–0.1)
Basophils Relative: 0.6 % (ref 0.0–3.0)
Eosinophils Absolute: 0 10*3/uL (ref 0.0–0.7)
Eosinophils Relative: 0.5 % (ref 0.0–5.0)
HCT: 44.3 % (ref 36.0–46.0)
Hemoglobin: 14.5 g/dL (ref 12.0–15.0)
Lymphocytes Relative: 38.3 % (ref 12.0–46.0)
Lymphs Abs: 2.2 10*3/uL (ref 0.7–4.0)
MCHC: 32.8 g/dL (ref 30.0–36.0)
MCV: 94.8 fL (ref 78.0–100.0)
Monocytes Absolute: 0.4 10*3/uL (ref 0.1–1.0)
Monocytes Relative: 7.4 % (ref 3.0–12.0)
Neutro Abs: 3 10*3/uL (ref 1.4–7.7)
Neutrophils Relative %: 53.2 % (ref 43.0–77.0)
Platelets: 236 10*3/uL (ref 150.0–400.0)
RBC: 4.67 Mil/uL (ref 3.87–5.11)
RDW: 13.1 % (ref 11.5–15.5)
WBC: 5.6 10*3/uL (ref 4.0–10.5)

## 2023-05-10 LAB — HEPATIC FUNCTION PANEL
ALT: 21 U/L (ref 0–35)
AST: 24 U/L (ref 0–37)
Albumin: 4.7 g/dL (ref 3.5–5.2)
Alkaline Phosphatase: 72 U/L (ref 39–117)
Bilirubin, Direct: 0.1 mg/dL (ref 0.0–0.3)
Total Bilirubin: 0.5 mg/dL (ref 0.2–1.2)
Total Protein: 7.9 g/dL (ref 6.0–8.3)

## 2023-05-10 LAB — URINALYSIS, ROUTINE W REFLEX MICROSCOPIC
Bilirubin Urine: NEGATIVE
Hgb urine dipstick: NEGATIVE
Ketones, ur: NEGATIVE
Leukocytes,Ua: NEGATIVE
Nitrite: NEGATIVE
RBC / HPF: NONE SEEN (ref 0–?)
Specific Gravity, Urine: 1.02 (ref 1.000–1.030)
Total Protein, Urine: NEGATIVE
Urine Glucose: NEGATIVE
Urobilinogen, UA: 0.2 (ref 0.0–1.0)
pH: 8 (ref 5.0–8.0)

## 2023-05-10 LAB — BASIC METABOLIC PANEL
BUN: 21 mg/dL (ref 6–23)
CO2: 24 meq/L (ref 19–32)
Calcium: 10.1 mg/dL (ref 8.4–10.5)
Chloride: 104 meq/L (ref 96–112)
Creatinine, Ser: 0.84 mg/dL (ref 0.40–1.20)
GFR: 77.8 mL/min (ref >60.00)
Glucose, Bld: 90 mg/dL (ref 70–99)
Potassium: 3.5 meq/L (ref 3.5–5.1)
Sodium: 140 meq/L (ref 135–145)

## 2023-05-10 LAB — TROPONIN I (HIGH SENSITIVITY): High Sens Troponin I: 6 ng/L (ref 2–17)

## 2023-05-10 LAB — D-DIMER, QUANTITATIVE: D-Dimer, Quant: 0.54 ug{FEU}/mL — ABNORMAL HIGH (ref <0.50)

## 2023-05-10 MED ORDER — TRIAMTERENE-HCTZ 37.5-25 MG PO CAPS
1.0000 | ORAL_CAPSULE | Freq: Every day | ORAL | 0 refills | Status: DC
Start: 2023-05-10 — End: 2023-12-14

## 2023-05-10 MED ORDER — GABAPENTIN 300 MG PO CAPS: 300.0000 mg | ORAL_CAPSULE | Freq: Four times a day (QID) | ORAL | 0 refills | Status: DC

## 2023-05-10 MED ORDER — ASPIRIN 81 MG PO TBEC: 81.0000 mg | DELAYED_RELEASE_TABLET | Freq: Every day | ORAL | 1 refills | Status: AC

## 2023-05-10 MED ORDER — ATORVASTATIN CALCIUM 80 MG PO TABS: 80.0000 mg | ORAL_TABLET | Freq: Every day | ORAL | 0 refills | Status: DC

## 2023-05-10 MED ORDER — DULOXETINE HCL 30 MG PO CPEP: 30.0000 mg | ORAL_CAPSULE | Freq: Every day | ORAL | 0 refills | Status: DC

## 2023-05-10 NOTE — Progress Notes (Unsigned)
Subjective:  Patient ID: Jacqueline Ferrell, female    DOB: 1965/07/21  Age: 57 y.o. MRN: [id number]  CC: Hypertension   HPI Jacqueline Ferrell presents for f/up -  Outpatient Medications Prior to Visit  Medication Sig Dispense Refill   acetaminophen (TYLENOL) 500 MG tablet Take 1,000 mg by mouth every 12 (twelve) hours as needed for moderate pain.     aspirin EC 81 MG tablet Take 1 tablet (81 mg total) by mouth daily. Swallow whole. 90 tablet 1   celecoxib (CELEBREX) 50 MG capsule TAKE 1 CAPSULE BY MOUTH 2 TIMES DAILY. 60 capsule 5   gabapentin (NEURONTIN) 100 MG capsule TAKE 1 CAPSULE BY MOUTH AT BEDTIME. 90 capsule 1   hydrALAZINE (APRESOLINE) 25 MG tablet TAKE 1 TABLET BY MOUTH THREE TIMES A DAY (Patient taking differently: Take 25 mg by mouth 3 (three) times daily. 2x's daily for BP) 270 tablet 1   Magnesium Oxide 250 MG TABS Take 1 tablet (250 mg total) by mouth at bedtime. 90 tablet 1   tiZANidine (ZANAFLEX) 4 MG tablet TAKE 1 TABLET BY MOUTH AT BEDTIME. 90 tablet 1   atorvastatin (LIPITOR) 80 MG tablet TAKE 1 TABLET BY MOUTH EVERY DAY 90 tablet 1   triamterene-hydrochlorothiazide (DYAZIDE) 37.5-25 MG capsule TAKE 1 CAPSULE BY MOUTH EVERY DAY. *INS ONLY COVERS 21 DS* 30 capsule 2   Cholecalciferol 50 MCG (2000 UT) TABS Take 1 tablet (2,000 Units total) by mouth daily. (Patient not taking: Reported on 05/10/2023) 90 tablet 1   nebivolol (BYSTOLIC) 5 MG tablet Take by mouth. (Patient not taking: Reported on 11/24/2022)     KLOR-CON M20 20 MEQ tablet TAKE 1 TABLET BY MOUTH TWICE A DAY (Patient not taking: Reported on 05/10/2023) 60 tablet 2   No facility-administered medications prior to visit.    ROS Review of Systems  Objective:  BP (!) 162/88 (BP Location: Right Arm, Patient Position: Sitting, Cuff Size: Large)   Pulse 66   Temp 97.9 F (36.6 C) (Oral)   Resp 16   Ht 5\' 5"  (1.651 m)   Wt 264 lb (119.7 kg)   LMP 02/10/2008   SpO2 99%   BMI 43.93 kg/m   BP Readings from Last  3 Encounters:  05/10/23 (!) 162/88  11/24/22 138/86  08/02/22 138/80    Wt Readings from Last 3 Encounters:  05/10/23 264 lb (119.7 kg)  11/24/22 265 lb (120.2 kg)  09/18/21 275 lb (124.7 kg)    Physical Exam Cardiovascular:     Rate and Rhythm: Normal rate and regular rhythm.     Heart sounds: No murmur heard.    No friction rub. No gallop.     Comments: EKG- NSR, 64 bpm No LVH, Q waves, or ST/T waves  Pulmonary:     Effort: Pulmonary effort is normal.     Breath sounds: No stridor. No wheezing, rhonchi or rales.  Musculoskeletal:     Right lower leg: 1+ Pitting Edema present.     Left lower leg: 1+ Pitting Edema present.     Lab Results  Component Value Date   WBC 5.6 05/10/2023   HGB 14.5 05/10/2023   HCT 44.3 05/10/2023   PLT 236.0 05/10/2023   GLUCOSE 90 05/10/2023   CHOL 247 (H) 08/02/2022   TRIG 80.0 08/02/2022   HDL 42.30 08/02/2022   LDLCALC 189 (H) 08/02/2022   ALT 21 05/10/2023   AST 24 05/10/2023   NA 140 05/10/2023   K 3.5 05/10/2023  CL 104 05/10/2023   CREATININE 0.84 05/10/2023   BUN 21 05/10/2023   CO2 24 05/10/2023   TSH 1.21 08/02/2022   INR 1.1 (H) 09/11/2010   HGBA1C 5.5 01/11/2019    MR Lumbar Spine W Wo Contrast  Result Date: 04/29/2021 CLINICAL DATA:  Lower back pain EXAM: MRI LUMBAR SPINE WITHOUT AND WITH CONTRAST TECHNIQUE: Multiplanar and multiecho pulse sequences of the lumbar spine were obtained without and with intravenous contrast. CONTRAST:  20mL MULTIHANCE GADOBENATE DIMEGLUMINE 529 MG/ML IV SOLN COMPARISON:  Lumbar spine MRI 06/24/2017 FINDINGS: Segmentation:  The lowest formed disc space is designated L5-S1. Alignment: There is 6 mm anterolisthesis of L4 on L5 and 4 mm retrolisthesis of L5 on S1, not significantly changed since 2018. Alignment at the other levels is normal. Vertebrae: There is degenerative endplate marrow signal abnormality at L4-5 and L5-S1. There is no suspicious marrow signal abnormality. There is no  abnormal marrow enhancement. Conus medullaris and cauda equina: Conus extends to the L1-L2 level. Conus and cauda equina appear normal. Paraspinal and other soft tissues: Multiple T2 hyperintense lesions in the kidneys likely reflects cysts. The paraspinal soft tissues are unremarkable. Disc levels: There is multilevel facet arthropathy, most advanced at L4-L5 and L5-S1. There are trace effusions at L5-S1. 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 T12-L1: Mild congenital canal stenosis. No significant neural foraminal stenosis L1-L2: Mild congenital canal stenosis. No significant neural foraminal stenosis. L2-L3: There is a minimal disc bulge and bilateral facet arthropathy superimposed on congenital canal stenosis resulting in mild to moderate spinal canal stenosis without significant neural foraminal stenosis. Findings are overall not significantly changed. L3-L4: There is a diffuse disc bulge with a left foraminal/extraforaminal component, ligamentum flavum thickening, and bilateral facet arthropathy resulting in moderate spinal canal stenosis with crowding of the subarticular zones, left worse than right, and moderate left and mild right neural foraminal stenosis. Findings are overall not significantly changed L4-L5: There is uncovering of the disc posteriorly with a mild superimposed bulge, ligamentum flavum thickening, degenerative endplate change, and bilateral facet arthropathy resulting in severe spinal canal stenosis with compression of the cauda equina nerve roots and moderate to severe left and moderate right neural foraminal stenosis. Findings are not significantly changed. L5-S1: There is a prominent diffuse disc bulge, degenerative endplate change, and bilateral facet arthropathy resulting in mild spinal canal stenosis with crowding of the bilateral subarticular zones and possible mass effect on the traversing S1 nerve roots and moderate to severe right worse than left neural  foraminal stenosis. Findings are overall not significantly changed. L1-L2: No significant spinal canal or neural foraminal stenosis. IMPRESSION: 1. Multilevel spondylosis and spondylolisthesis are overall not significantly changed compared to the study from 2018. 2. Severe spinal canal stenosis with compression of the cauda equina nerve roots at L4-L5, and moderate spinal canal stenosis at L2-L3 and L3-L4 due to above described degenerative changes superimposed on congenital narrowing of the spinal canal. 3. Moderate left neural foraminal stenosis at L3-L4, severe left and moderate right neural foraminal stenosis at L4-L5, and severe right worse than left neural foraminal stenosis at L5-S1. 4. Multilevel facet arthropathy, most advanced at L4-L5. Trace bilateral facet joint effusions at L5-S1. Electronically Signed   By: Lesia Hausen M.D.   On: 04/29/2021 11:45    Assessment & Plan:  Need for immunization against influenza -     Flu vaccine trivalent PF, 6mos and older(Flulaval,Afluria,Fluarix,Fluzone)  Primary hypertension -     EKG 12-Lead -  CBC with Differential/Platelet; Future -     Hepatic function panel; Future -     Urinalysis, Routine w reflex microscopic; Future -     Triamterene-HCTZ; Take 1 each (1 capsule total) by mouth daily. TAKE 1 CAPSULE BY MOUTH EVERY DAY. *INS ONLY COVERS 21 DS*  Dispense: 90 capsule; Refill: 0  Bilateral leg edema -     Troponin I (High Sensitivity); Future -     Hepatic function panel; Future -     Urinalysis, Routine w reflex microscopic; Future -     Basic metabolic panel; Future -     Brain natriuretic peptide; Future -     D-dimer, quantitative; Future -     Triamterene-HCTZ; Take 1 each (1 capsule total) by mouth daily. TAKE 1 CAPSULE BY MOUTH EVERY DAY. *INS ONLY COVERS 21 DS*  Dispense: 90 capsule; Refill: 0  Atherosclerosis of native coronary artery of native heart without angina pectoris -     Troponin I (High Sensitivity); Future -      Brain natriuretic peptide; Future -     D-dimer, quantitative; Future -     AMB Referral [facility] Care Management -     Atorvastatin Calcium; Take 1 tablet (80 mg total) by mouth daily.  Dispense: 90 tablet; Refill: 0  Current severe episode of major depressive disorder without psychotic features without prior episode (HCC) -     DULoxetine HCl; Take 1 capsule (30 mg total) by mouth daily.  Dispense: 30 capsule; Refill: 0  Osteoarthritis of both knees, unspecified osteoarthritis type -     DULoxetine HCl; Take 1 capsule (30 mg total) by mouth daily.  Dispense: 30 capsule; Refill: 0 -     AMB Referral [facility] Care Management  Hyperlipidemia with target LDL less than 70 -     Atorvastatin Calcium; Take 1 tablet (80 mg total) by mouth daily.  Dispense: 90 tablet; Refill: 0     Follow-up: Return in about 6 weeks (around 06/21/2023).  Sanda Linger, MD

## 2023-05-10 NOTE — Patient Instructions (Signed)

## 2023-05-11 ENCOUNTER — Telehealth: Payer: Self-pay | Admitting: *Deleted

## 2023-05-11 NOTE — Progress Notes (Signed)
  Care Coordination   Note   05/11/2023 Name: [name] MRN: [id number] DOB: September 21, 1965  [name] is a 57 y.o. year old female who sees Etta Grandchild, MD for primary care. I reached out to Ceasar Lund by phone today to offer care coordination services.  Ms. Hamblin was given information about Care Coordination services today including:   The Care Coordination services include support from the care team which includes your Nurse Coordinator, Clinical Social Worker, or Pharmacist.  The Care Coordination team is here to help remove barriers to the health concerns and goals most important to you. Care Coordination services are voluntary, and the patient may decline or stop services at any time by request to their care team member.   Care Coordination Consent Status: Patient agreed to services and verbal consent obtained.   Follow up plan:  Telephone appointment with care coordination team member scheduled for:  05/12/2023 and 11/6/20224  Encounter Outcome:  Patient Scheduled from referral   Burman Nieves, [facility] Care Coordination Care Guide Direct Dial: [phone]

## 2023-05-12 ENCOUNTER — Ambulatory Visit: Payer: Self-pay | Admitting: Licensed Clinical Social Worker

## 2023-05-12 NOTE — Patient Outreach (Signed)
  Care Coordination   Initial Visit Note   05/12/2023 Name: Jacqueline Ferrell MRN: [id number] DOB: [birth year]  Jacqueline Ferrell is a 57 y.o. year old female who sees Jacqueline Grandchild, MD for primary care. I spoke with  Jacqueline Ferrell by phone today.  What matters to the patients health and wellness today?  Jacqueline Ferrell reports feelings of grief due to the passing of her daughter.     Goals Addressed             This Visit's Progress    Care Coordination       Activities and task to complete in order to accomplish goals.   You have decided not to move forward with counseling and would like to work with me on brief coping skills to assist you with managing depression associated with grief Continue with compliance of taking medication prescribed by Doctor Continue physical activity such as swimming and walking.   Begin journal writing to identify emotional triggers.        SDOH assessments and interventions completed:  Yes  SDOH Interventions Today    Flowsheet Row Most Recent Value  SDOH Interventions   Food Insecurity Interventions Intervention Not Indicated  Housing Interventions Intervention Not Indicated  Utilities Interventions Intervention Not Indicated  Depression Interventions/Treatment  Counseling  [pt not interested in grief counseling at this time. Jacqueline Ferrell will follow up with patient to offer moral support]        Care Coordination Interventions:  Yes, provided   Interventions Today    Flowsheet Row Most Recent Value  Chronic Disease   Chronic disease during today's visit Other  General Interventions   General Interventions Discussed/Reviewed General Interventions Discussed  Exercise Interventions   Exercise Discussed/Reviewed Physical Activity, Exercise Discussed  [Pt goes the "Y" swimming and walking]  Mental Health Interventions   Mental Health Discussed/Reviewed Grief and Loss, Mental Health Discussed, Mental Health Reviewed, Coping Strategies   Nutrition Interventions   Nutrition Discussed/Reviewed Nutrition Discussed       Follow up plan: follow up 05/26/2023   Encounter Outcome:  Patient Visit Completed    Jacqueline Ferrell MSW, Jacqueline Licensed Clinical Social Worker  [facility], Population Health Direct Dial: [phone]  Fax: [phone]

## 2023-05-13 ENCOUNTER — Encounter: Payer: Self-pay | Admitting: Internal Medicine

## 2023-05-16 ENCOUNTER — Other Ambulatory Visit: Payer: Self-pay | Admitting: Internal Medicine

## 2023-05-16 DIAGNOSIS — I2510 Atherosclerotic heart disease of native coronary artery without angina pectoris: Secondary | ICD-10-CM

## 2023-05-16 DIAGNOSIS — E785 Hyperlipidemia, unspecified: Secondary | ICD-10-CM

## 2023-05-16 MED ORDER — REPATHA SURECLICK 140 MG/ML ~~LOC~~ SOAJ: 140.0000 mg | SUBCUTANEOUS | 0 refills | Status: DC

## 2023-05-18 ENCOUNTER — Ambulatory Visit: Payer: Self-pay

## 2023-05-18 NOTE — Patient Outreach (Signed)
  Care Coordination   Initial Visit Note   05/18/2023 Name: Jacqueline Ferrell MRN: [id number] DOB: 09/13/1965  Jacqueline Ferrell is a 57 y.o. year old female who sees Etta Grandchild, MD for primary care. I spoke with  Jacqueline Ferrell by phone today.  What matters to the patients health and wellness today?  Referral from primary care provider.  Last office visit with primary provider 05/10/23. Per review of chart, BP elevated at last office visit 162/88. Ms. Loera reports recent loss of her daughter-working with LCSW re: grief/loss. She ackowledges that she has been under a lot of stress recently. She reports she is keeping track of her blood pressure.   Goals Addressed             This Visit's Progress    Assist with health management       Interventions Today    Flowsheet Row Most Recent Value  Chronic Disease   Chronic disease during today's visit Hypertension (HTN), Other  General Interventions   General Interventions Discussed/Reviewed General Interventions Discussed, Doctor Visits  [Evaluation of current treatment plan for health condition and patient's adherence to plan.]  Doctor Visits Discussed/Reviewed Doctor Visits Discussed, PCP  PCP/Specialist Visits Compliance with follow-up visit  [facility] encouraged patient to call to schedule recommended follow up visit with primary provider for December 2025.]  Education Interventions   Education Provided Provided Web-based Education, Provided Education  [assigned emmi-Relieving stress,  HTN, lifestyle changes to lower blood pressure: overview]  Provided Verbal Education On Nutrition, Medication, When to see the doctor, Blood Sugar Monitoring, Other  Mental Health Interventions   Mental Health Discussed/Reviewed Mental Health Discussed, Coping Strategies, Grief and Loss  [active listening, encouraged patient to continue to work with LCSW.]  Nutrition Interventions   Nutrition Discussed/Reviewed Nutrition Discussed  Pharmacy Interventions    Pharmacy Dicussed/Reviewed Pharmacy Topics Discussed  [medication review completed]            SDOH assessments and interventions completed:  Yes recently completed. Patient reports no changes.   SDOH Interventions Today    Flowsheet Row Most Recent Value  SDOH Interventions   Transportation Interventions Intervention Not Indicated     Care Coordination Interventions:  Yes, provided   Follow up plan: Follow up call scheduled for 06/14/23    Encounter Outcome:  Patient Visit Completed   Kathyrn Sheriff, RN, MSN, BSN, CCM Care Management Coordinator [phone]

## 2023-05-18 NOTE — Patient Instructions (Signed)
Visit Information  Thank you for taking time to visit with me today. Please don't hesitate to contact me if I can be of assistance to you.   Following are the goals we discussed today:  Continue to take medications as prescribed. Continue to attend provider visits as scheduled Continue to eat healthy, lean meats, vegetables, fruits, avoid saturated and transfats Contact provider with health questions or concerns as needed Continue to check blood pressure routinely and contact provider if outside of recommended range or with questions or concerns   Our next appointment is by telephone on 06/14/23 at 2:30 pm  Please call the care guide team at [phone] if you need to cancel or reschedule your appointment.   If you are experiencing a Mental Health or Behavioral Health Crisis or need someone to talk to, please call the Suicide and Crisis Lifeline: 988 call the [place] National Suicide Prevention Lifeline: 825 830 5249 or TTY: 432 206 1563 TTY 219-837-4717) to talk to a trained counselor call 1-800-273-TALK (toll free, 24 hour hotline)   Kathyrn Sheriff, RN, MSN, BSN, CCM Care Management Coordinator [phone]

## 2023-05-19 ENCOUNTER — Other Ambulatory Visit: Payer: Self-pay | Admitting: Internal Medicine

## 2023-05-19 DIAGNOSIS — I2510 Atherosclerotic heart disease of native coronary artery without angina pectoris: Secondary | ICD-10-CM

## 2023-05-20 ENCOUNTER — Other Ambulatory Visit: Payer: Self-pay | Admitting: Internal Medicine

## 2023-05-20 DIAGNOSIS — E785 Hyperlipidemia, unspecified: Secondary | ICD-10-CM

## 2023-05-20 DIAGNOSIS — I2510 Atherosclerotic heart disease of native coronary artery without angina pectoris: Secondary | ICD-10-CM

## 2023-05-24 ENCOUNTER — Other Ambulatory Visit: Payer: Self-pay | Admitting: Internal Medicine

## 2023-05-24 DIAGNOSIS — I2510 Atherosclerotic heart disease of native coronary artery without angina pectoris: Secondary | ICD-10-CM

## 2023-05-24 DIAGNOSIS — E785 Hyperlipidemia, unspecified: Secondary | ICD-10-CM

## 2023-05-24 MED ORDER — NEXLIZET 180-10 MG PO TABS: 1.0000 | ORAL_TABLET | Freq: Every day | ORAL | 0 refills | Status: DC

## 2023-05-26 ENCOUNTER — Telehealth: Payer: Self-pay | Admitting: Licensed Clinical Social Worker

## 2023-05-26 ENCOUNTER — Ambulatory Visit: Payer: Self-pay | Admitting: Licensed Clinical Social Worker

## 2023-05-26 NOTE — Patient Outreach (Signed)
  Care Coordination   05/26/2023 Name: Jacqueline Ferrell MRN: [id number] DOB: [birth year]   Care Coordination Outreach Attempts:  An unsuccessful telephone outreach was attempted today to offer the patient information about available care coordination services.  Follow Up Plan:  Additional outreach attempts will be made to offer the patient care coordination information and services.   Encounter Outcome:  No Answer   Care Coordination Interventions:  No, not indicated   Gwyndolyn Saxon MSW, LCSW Licensed Clinical Social Worker  [facility], Population Health Direct Dial: [phone]  Fax: [phone]

## 2023-05-27 NOTE — Patient Outreach (Signed)
  Care Coordination   Follow Up Visit Note   05/26/2023 Name: LACIA DOBSON MRN: [id number] DOB: [birth year]  MILDRID MYRICK is a 57 y.o. year old female who sees Etta Grandchild, MD for primary care. I spoke with  Ceasar Lund by phone today.  What matters to the patients health and wellness today?  Ms. Cohill reports she is taking prescribed medication to assist with depressive symptoms. Ms. Thorne reports family and friends are supportive.     Goals Addressed             This Visit's Progress    COMPLETED: Care Coordination       Activities and task to complete in order to accomplish goals.   You have decided not to move forward with counseling and would like to work with me on brief coping skills to assist you with managing depression associated with grief Continue with compliance of taking medication prescribed by Doctor Continue physical activity such as swimming and walking.   Begin journal writing to identify emotional triggers.        SDOH assessments and interventions completed:  Yes     Care Coordination Interventions:  Yes, provided   Follow up plan: No further intervention required.   Encounter Outcome:  Patient Visit Completed    Gwyndolyn Saxon MSW, LCSW Licensed Clinical Social Worker  [facility], Population Health Direct Dial: [phone]  Fax: [phone]

## 2023-06-04 ENCOUNTER — Other Ambulatory Visit: Payer: Self-pay | Admitting: Internal Medicine

## 2023-06-04 DIAGNOSIS — M170 Bilateral primary osteoarthritis of knee: Secondary | ICD-10-CM

## 2023-06-04 DIAGNOSIS — F322 Major depressive disorder, single episode, severe without psychotic features: Secondary | ICD-10-CM

## 2023-06-04 MED ORDER — DULOXETINE HCL 60 MG PO CPEP: 60.0000 mg | ORAL_CAPSULE | Freq: Every day | ORAL | 0 refills | Status: DC

## 2023-06-14 ENCOUNTER — Telehealth: Payer: Self-pay

## 2023-06-14 NOTE — Patient Outreach (Signed)
  Care Coordination   06/14/2023 Name: Jacqueline Ferrell MRN: [id number] DOB: Aug 21, 1965   Care Coordination Outreach Attempts:  An unsuccessful telephone outreach was attempted for a scheduled appointment today.  Follow Up Plan:  Additional outreach attempts will be made to offer the patient care coordination information and services.   Encounter Outcome:  No Answer   Care Coordination Interventions:  No, not indicated    Kathyrn Sheriff, RN, MSN, BSN, CCM Care Management Coordinator [phone]

## 2023-06-17 ENCOUNTER — Encounter: Payer: Self-pay | Admitting: Internal Medicine

## 2023-06-17 ENCOUNTER — Other Ambulatory Visit: Payer: Self-pay | Admitting: Internal Medicine

## 2023-06-17 DIAGNOSIS — M170 Bilateral primary osteoarthritis of knee: Secondary | ICD-10-CM

## 2023-06-17 MED ORDER — DULOXETINE HCL 60 MG PO CPEP: 60.0000 mg | ORAL_CAPSULE | Freq: Every day | ORAL | 0 refills | Status: DC

## 2023-06-19 ENCOUNTER — Other Ambulatory Visit: Payer: Self-pay | Admitting: Internal Medicine

## 2023-06-19 DIAGNOSIS — M170 Bilateral primary osteoarthritis of knee: Secondary | ICD-10-CM

## 2023-06-19 DIAGNOSIS — F322 Major depressive disorder, single episode, severe without psychotic features: Secondary | ICD-10-CM

## 2023-06-24 ENCOUNTER — Ambulatory Visit: Payer: Self-pay

## 2023-06-24 ENCOUNTER — Telehealth: Payer: Self-pay

## 2023-06-24 NOTE — Patient Instructions (Signed)
Visit Information  Thank you for taking time to visit with me today. Please don't hesitate to contact me if I can be of assistance to you.   Following are the goals we discussed today:  Pick up your medications from your local pharmacy. Contact your primary provider if you have any difficulty obtaining medications or contact your RN Case manager if needed. Continue to take medications as prescribed. Continue to attend provider visits as scheduled Continue to eat healthy, lean meats, vegetables, fruits, avoid saturated and transfats Contact provider with health questions or concerns as needed Continue to check blood pressure routinely and contact provider if questions or concerns  Our next appointment is by telephone on 07/25/23 at 3:15 pm  Please call the care guide team at [phone] if you need to cancel or reschedule your appointment.   If you are experiencing a Mental Health or Behavioral Health Crisis or need someone to talk to, please call the Suicide and Crisis Lifeline: 988 call the [place] National Suicide Prevention Lifeline: 308 637 3629 or TTY: 903-604-4412 TTY (772)313-5723) to talk to a trained counselor  Kathyrn Sheriff, RN, MSN, BSN, CCM Care Management Coordinator [phone]

## 2023-06-24 NOTE — Patient Outreach (Signed)
  Care Coordination   Follow Up Visit Note   06/24/2023 Name: Jacqueline Ferrell MRN: [id number] DOB: 1966/06/21  Jacqueline Ferrell is a 57 y.o. year old female who sees Jacqueline Grandchild, MD for primary care. I spoke with  Jacqueline Ferrell by phone today.  What matters to the patients health and wellness today?  Jacqueline Ferrell reports she is doing better. She states the Cymbalta is helping. She has been checking her blood pressure. Last reading 136/78. Medication review completed. Per review of chart prescription for Cymbalta and cholesterol medication sent her her pharmacy. Jacqueline Ferrell states she will follow up with her pharmacy to pick up cholesterol medication, Cymbalta and Magnesium. She reports she will follow up with primary care provider if she has any problems obtaining medications. Next office visit with primary care 07/07/23. She is without questions or concerns at this time.  Goals Addressed             This Visit's Progress    Assist with health management       Interventions Today    Flowsheet Row Most Recent Value  Chronic Disease   Chronic disease during today's visit Hypertension (HTN)  General Interventions   General Interventions Discussed/Reviewed General Interventions Reviewed, Doctor Visits  [Evaluation of current treatment plan for health condition and patient's adherence to plan.]  Doctor Visits Discussed/Reviewed PCP, Specialist  PCP/Specialist Visits Compliance with follow-up visit  [reviewed upcoming appointments]  Education Interventions   Education Provided Provided Education  Provided Verbal Education On When to see the doctor, Medication, Nutrition  Mental Health Interventions   Mental Health Discussed/Reviewed Mental Health Reviewed, Grief and Loss  Jacqueline Ferrell if any additional resource needs. encouraged take medications as prescribed]  Pharmacy Interventions   Pharmacy Dicussed/Reviewed Pharmacy Topics Reviewed  [medication review completed.]            SDOH  assessments and interventions completed:  No  Care Coordination Interventions:  Yes, provided   Follow up plan: Follow up call scheduled for 07/25/23    Encounter Outcome:  Patient Visit Completed   Jacqueline Sheriff, RN, MSN, BSN, CCM Care Management Coordinator [phone]

## 2023-06-24 NOTE — Patient Outreach (Signed)
  Care Coordination   06/24/2023 Name: Jacqueline Ferrell MRN: [id number] DOB: [birth year]   Care Coordination Outreach Attempts:  An unsuccessful telephone outreach was attempted for a scheduled appointment today.  Follow Up Plan:  Additional outreach attempts will be made to offer the patient care coordination information and services.   Encounter Outcome:  No Answer   Care Coordination Interventions:  No, not indicated    Kathyrn Sheriff, RN, MSN, BSN, CCM Care Management Coordinator [phone]

## 2023-06-26 ENCOUNTER — Other Ambulatory Visit: Payer: Self-pay | Admitting: Internal Medicine

## 2023-06-26 DIAGNOSIS — I2510 Atherosclerotic heart disease of native coronary artery without angina pectoris: Secondary | ICD-10-CM

## 2023-06-26 DIAGNOSIS — E785 Hyperlipidemia, unspecified: Secondary | ICD-10-CM

## 2023-06-27 ENCOUNTER — Other Ambulatory Visit: Payer: Self-pay | Admitting: Internal Medicine

## 2023-06-27 DIAGNOSIS — E785 Hyperlipidemia, unspecified: Secondary | ICD-10-CM

## 2023-06-27 DIAGNOSIS — I2510 Atherosclerotic heart disease of native coronary artery without angina pectoris: Secondary | ICD-10-CM

## 2023-06-28 ENCOUNTER — Other Ambulatory Visit: Payer: Self-pay | Admitting: Internal Medicine

## 2023-06-28 DIAGNOSIS — E785 Hyperlipidemia, unspecified: Secondary | ICD-10-CM

## 2023-06-28 DIAGNOSIS — I2510 Atherosclerotic heart disease of native coronary artery without angina pectoris: Secondary | ICD-10-CM

## 2023-06-30 ENCOUNTER — Other Ambulatory Visit (HOSPITAL_BASED_OUTPATIENT_CLINIC_OR_DEPARTMENT_OTHER): Payer: Self-pay | Admitting: Internal Medicine

## 2023-06-30 DIAGNOSIS — Z1231 Encounter for screening mammogram for malignant neoplasm of breast: Secondary | ICD-10-CM

## 2023-07-04 ENCOUNTER — Inpatient Hospital Stay (HOSPITAL_BASED_OUTPATIENT_CLINIC_OR_DEPARTMENT_OTHER): Admission: RE | Admit: 2023-07-04 | Payer: Self-pay | Source: Ambulatory Visit | Admitting: Radiology

## 2023-07-07 ENCOUNTER — Telehealth: Payer: Self-pay

## 2023-07-07 ENCOUNTER — Encounter: Payer: Self-pay | Admitting: Internal Medicine

## 2023-07-07 ENCOUNTER — Ambulatory Visit: Payer: Self-pay | Admitting: Internal Medicine

## 2023-07-07 ENCOUNTER — Other Ambulatory Visit (HOSPITAL_COMMUNITY): Payer: Self-pay

## 2023-07-07 NOTE — Telephone Encounter (Signed)
 PA has been submitted and documented in separate encounter, please sign off on rx in this encounter as PA team is unable to resolve RX requests. Thank you

## 2023-07-07 NOTE — Telephone Encounter (Signed)
Patient has been made aware that her medication was approved and ready for pick up. She gave a verbal understanding.

## 2023-07-07 NOTE — Telephone Encounter (Signed)
Pharmacy Patient Advocate Encounter  Received notification from  [facility]  that Prior Authorization for NEXLIZET has been APPROVED from 06/27/2023 to 07/06/2024   PA #/Case ID/Reference #: [id number]

## 2023-07-07 NOTE — Telephone Encounter (Signed)
Pharmacy Patient Advocate Encounter   Received notification from RX Request Messages that prior authorization for Nexlizet 180-10MG  tablets is required/requested.   Insurance verification completed.   The patient is insured through  [facility]  .   Per test claim: PA required; PA submitted to above mentioned insurance via CoverMyMeds Key/confirmation #/EOC [id number] Status is pending

## 2023-07-17 ENCOUNTER — Other Ambulatory Visit: Payer: Self-pay | Admitting: Internal Medicine

## 2023-07-17 DIAGNOSIS — M48062 Spinal stenosis, lumbar region with neurogenic claudication: Secondary | ICD-10-CM

## 2023-07-17 DIAGNOSIS — M170 Bilateral primary osteoarthritis of knee: Secondary | ICD-10-CM

## 2023-07-17 NOTE — Progress Notes (Signed)
 Cardiology Office Note    Date:  07/20/2023  ID:  Jacqueline Ferrell, DOB 1966-07-12, MRN [id number] PCP:  Jacqueline Debby CROME, MD  Cardiologist:  Lynwood Schilling, MD  Electrophysiologist:  None   Chief Complaint: Follow up for CAD   History of Present Illness: .    Jacqueline Ferrell is a 58 y.o. female with visit-pertinent history of CAD s/p DES to LAD in March 2012, hypertension and hyperlipidemia. She had negative perfusion study in 2018 prior to bariatric surgery.   She was last seen in clinic on 09/18/2021 by Dr. Schilling.  She remained stable from a cardiac standpoint. Notes she has lower extremity edema, that has always been present, notes she had a procedure when she was a child and has intermittent swelling since.   Today she presents for follow-up.  She reports that she is doing well overall, she denies any cardiac concerns or complaints today.  She denies chest pain, shortness of breath, palpitations, orthopnea or PND.  She does report that she has some intermittent lower extremity edema, notes that she has had this since she was a child, notes that she had procedures done on her legs and has intermittent swelling since.  She notes that it worsens as the day goes on if she is been on her feet or sitting in a dependent position and with increased salt intake.  Improved with compression stockings, decrease salt intake and elevation of extremities.  She notes that her daughter recently passed away and she has been having some difficulty with grief but notes this is overall improving.  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  Labwork independently reviewed: 05/10/2023: Sodium 140, potassium 3.5, creatinine 0.84, AST 24, ALT 21 ROS: .   Today she denies chest pain, shortness of breath, lower extremity edema, fatigue, palpitations, melena, hematuria, hemoptysis, diaphoresis, weakness, presyncope, syncope, orthopnea, and PND.  All other systems are reviewed and otherwise negative.  Studies Reviewed: SABRA     EKG:  EKG is not ordered today. EKG reviewed from 05/10/23  CV Studies:  Cardiac Studies & Procedures     STRESS TESTS  MYOCARDIAL PERFUSION IMAGING 05/20/2017  Narrative  Nuclear stress EF: 66%. The left ventricular ejection fraction is hyperdynamic (>65%).  There were no ST or T wave changes with Lexiscan .  No evidence of ischemia. Normal LV function .  The study is normal. This is a low risk study.              Current Reported Medications:.    Current Meds  Medication Sig   acetaminophen  (TYLENOL ) 500 MG tablet Take 1,000 mg by mouth every 12 (twelve) hours as needed for moderate pain.   aspirin  EC 81 MG tablet Take 1 tablet (81 mg total) by mouth daily. Swallow whole.   atorvastatin  (LIPITOR) 80 MG tablet Take 1 tablet (80 mg total) by mouth daily.   Bempedoic Acid-Ezetimibe  (NEXLIZET ) 180-10 MG TABS Take 1 tablet by mouth daily.   celecoxib  (CELEBREX ) 50 MG capsule TAKE 1 CAPSULE BY MOUTH TWICE A DAY   Cholecalciferol  50 MCG (2000 UT) TABS Take 1 tablet (2,000 Units total) by mouth daily.   DULoxetine  (CYMBALTA ) 60 MG capsule Take 1 capsule (60 mg total) by mouth daily.   gabapentin  (NEURONTIN ) 300 MG capsule Take 1 capsule (300 mg total) by mouth 4 (four) times daily.   hydrALAZINE  (APRESOLINE ) 25 MG tablet TAKE 1 TABLET BY MOUTH THREE TIMES A DAY (Patient taking differently: Take 25 mg by  mouth 2 (two) times daily. 3x's daily if diastolic BP is 90 or above)   Magnesium  Oxide 250 MG TABS Take 1 tablet (250 mg total) by mouth at bedtime.   Multiple Vitamin (MULTIVITAMIN WITH MINERALS) TABS tablet Take 1 tablet by mouth daily.   tiZANidine  (ZANAFLEX ) 4 MG tablet TAKE 1 TABLET BY MOUTH AT BEDTIME.   triamterene -hydrochlorothiazide (DYAZIDE) 37.5-25 MG capsule Take 1 each (1 capsule total) by mouth daily. TAKE 1 CAPSULE BY MOUTH EVERY DAY. *INS ONLY COVERS 21 DS*    Physical Exam:    VS:  BP 134/78   Pulse 68   Ht 5' 6 (1.676 m)   Wt 269 lb (122 kg)   LMP  02/10/2008   SpO2 98%   BMI 43.42 kg/m    Wt Readings from Last 3 Encounters:  07/20/23 269 lb (122 kg)  05/10/23 264 lb (119.7 kg)  11/24/22 265 lb (120.2 kg)    GEN: Well nourished, well developed in no acute distress NECK: No JVD; No carotid bruits CARDIAC: RRR, no murmurs, rubs, gallops RESPIRATORY:  Clear to auscultation without rales, wheezing or rhonchi  ABDOMEN: Soft, non-tender, non-distended EXTREMITIES:  Mild ankle edema; No acute deformity   Asessement and Plan:.    CAD: History of DES to LAD in March 2012.  She had negative perfusion study in 2018. Stable with no anginal symptoms. No indication for ischemic evaluation.  Notes she has not been eating well, plans to restart on heart healthy diet. Heart healthy diet and regular cardiovascular exercise encouraged.  Reviewed ED precautions. Continue aspirin  81 mg daily, Lipitor 80 mg daily, Nexlizet , hydralazine , triamterene  hydrochlorothiazide.  Lower extremity edema: Patient reports she has always had lower extremity edema since she was a child. Reports she had a procedure on her legs and has had intermittent swelling since. Worsens with increased salt intake and when standing on her feet for long periods of time.  She denies shortness of breath, orthopnea or PND.  Discussed echocardiogram, patient deferred, she will notify the office if she has new shortness of breath, orthopnea or PND.  On triamterene -HCTZ 37.5-25 daily.  HTN: Initial blood pressure today 147/86, on recheck 134/78.  She notes she has not taken her hydralazine  yet today.  Continue hydralazine  25 mg 3 times a day and triamterene -HCTZ 37.5-25 mg daily.  HLD: Last lipid profile on 08/02/2022 indicated total cholesterol 247, HDL 42.3, triglycerides 80, LDL 189. Reports she was previously on Repatha  which improved her cholesterol, insurance would not cover.  She reports that she started on Nexlizet  three days ago per her PCP.  She has been referred to lipid clinic with  Dr. Mona, appointment scheduled for 10/11/2023.    Disposition: F/u with Dr. Lavona or Abdulrahim Siddiqi, NP in 6 months or sooner if needed.   Signed, Jorden Mahl D Ngoc Daughtridge, NP

## 2023-07-20 ENCOUNTER — Encounter: Payer: Self-pay | Admitting: Cardiology

## 2023-07-20 ENCOUNTER — Ambulatory Visit: Payer: 59 | Attending: Cardiology | Admitting: Cardiology

## 2023-07-20 VITALS — BP 134/78 | HR 68 | Ht 66.0 in | Wt 269.0 lb

## 2023-07-20 DIAGNOSIS — I2510 Atherosclerotic heart disease of native coronary artery without angina pectoris: Secondary | ICD-10-CM

## 2023-07-20 DIAGNOSIS — E785 Hyperlipidemia, unspecified: Secondary | ICD-10-CM | POA: Diagnosis not present

## 2023-07-20 DIAGNOSIS — R600 Localized edema: Secondary | ICD-10-CM

## 2023-07-20 DIAGNOSIS — I10 Essential (primary) hypertension: Secondary | ICD-10-CM

## 2023-07-20 NOTE — Patient Instructions (Addendum)
 Medication Instructions:  No changes *If you need a refill on your cardiac medications before your next appointment, please call your pharmacy*  Lab Work: No labs  Testing/Procedures: No testing  Follow-Up: At [facility], you and your health needs are our priority.  As part of our continuing mission to provide you with exceptional heart care, we have created designated Provider Care Teams.  These Care Teams include your primary Cardiologist (physician) and Advanced Practice Providers (APPs -  Physician Assistants and Nurse Practitioners) who all work together to provide you with the care you need, when you need it.  We recommend signing up for the patient portal called "MyChart".  Sign up information is provided on this After Visit Summary.  MyChart is used to connect with patients for Virtual Visits (Telemedicine).  Patients are able to view lab/test results, encounter notes, upcoming appointments, etc.  Non-urgent messages can be sent to your provider as well.   To learn more about what you can do with MyChart, go to [email or web address].    Your next appointment:   6 month(s)  Provider:   Rollene Rotunda, MD

## 2023-07-25 ENCOUNTER — Ambulatory Visit: Payer: Self-pay

## 2023-07-25 NOTE — Patient Outreach (Signed)
  Care Coordination   Follow Up Visit Note   07/25/2023 Name: Jacqueline Ferrell MRN: [id number] DOB: 1966-05-02  Jacqueline Ferrell is a 58 y.o. year old female who sees Joshua Debby CROME, MD for primary care. I spoke with  [name] by phone today.  What matters to the patients health and wellness today?  [name] reports, I am doing better, I am good. [name] states she continues to monitor BP and denies any issues with readings at home. Patient states she has all her medications now and expresses her mood has improved. She denies any questions or concerns at this time. Patient request follow up call next month. She states she would like to make sure that she stays on top of things.  Goals Addressed             This Visit's Progress    Assist with health management       Interventions Today    Flowsheet Row Most Recent Value  Chronic Disease   Chronic disease during today's visit Other, Hypertension (HTN)  [mood]  General Interventions   General Interventions Discussed/Reviewed General Interventions Reviewed  [Evaluation of current treatment plan for health condition and patient's adherence to plan. assessed management of blood pressure and if any questions.]  Exercise Interventions   Exercise Discussed/Reviewed Physical Activity, Exercise Discussed  [discussed incorporating physical activity/exercise to help with improving mood.]  Education Interventions   Education Provided Provided Education  Provided Verbal Education On Mental Health/Coping with Illness, Exercise, Medication, When to see the doctor  [advised to continue to take medications as presribed, eat healthy, incorporate exercise in weekly routine]  Pharmacy Interventions   Pharmacy Dicussed/Reviewed Pharmacy Topics Reviewed  [medications reviewed]            SDOH assessments and interventions completed:  No  Care Coordination Interventions:  No, not indicated   Follow up plan: Follow up call scheduled for  08/25/23    Encounter Outcome:  Patient Visit Completed   Heddy Shutter, RN, MSN, BSN, CCM Care Management Coordinator [phone]

## 2023-07-25 NOTE — Patient Instructions (Signed)
 Visit Information  Thank you for taking time to visit with me today. Please don't hesitate to contact me if I can be of assistance to you.   Following are the goals we discussed today:  Continue to take medications as prescribed. Continue to attend provider visits as scheduled Continue to eat healthy, lean meats, vegetables, fruits, avoid saturated and transfats Contact provider with health questions or concerns as needed Continue to check blood pressure routinely and contact provider if questions or concerns   Our next appointment is by telephone on 08/25/23 at 3:00 pm  Please call the care guide team at [phone] if you need to cancel or reschedule your appointment.   If you are experiencing a Mental Health or Behavioral Health Crisis or need someone to talk to, please call the Suicide and Crisis Lifeline: 988 call the [place]  National Suicide Prevention Lifeline: 7206409542 or TTY: (718) 395-6944 TTY (602)178-5204) to talk to a trained counselor  Heddy Shutter, RN, MSN, BSN, CCM Care Management Coordinator [phone]

## 2023-08-04 ENCOUNTER — Ambulatory Visit (INDEPENDENT_AMBULATORY_CARE_PROVIDER_SITE_OTHER): Payer: 59 | Admitting: Internal Medicine

## 2023-08-04 VITALS — BP 136/88 | HR 71 | Temp 98.1°F | Resp 16 | Wt 270.0 lb

## 2023-08-04 DIAGNOSIS — I2510 Atherosclerotic heart disease of native coronary artery without angina pectoris: Secondary | ICD-10-CM

## 2023-08-04 DIAGNOSIS — B351 Tinea unguium: Secondary | ICD-10-CM | POA: Diagnosis not present

## 2023-08-04 DIAGNOSIS — Z1231 Encounter for screening mammogram for malignant neoplasm of breast: Secondary | ICD-10-CM | POA: Insufficient documentation

## 2023-08-04 DIAGNOSIS — I10 Essential (primary) hypertension: Secondary | ICD-10-CM

## 2023-08-04 DIAGNOSIS — E785 Hyperlipidemia, unspecified: Secondary | ICD-10-CM

## 2023-08-04 DIAGNOSIS — Z1159 Encounter for screening for other viral diseases: Secondary | ICD-10-CM

## 2023-08-04 MED ORDER — FLUCONAZOLE 150 MG PO TABS: 300.0000 mg | ORAL_TABLET | ORAL | 0 refills | Status: AC

## 2023-08-04 NOTE — Patient Instructions (Signed)

## 2023-08-04 NOTE — Progress Notes (Signed)
Subjective:  Patient ID: Jacqueline Ferrell, female    DOB: [birth year]  Age: 58 y.o. MRN: [id number]  CC: Hyperlipidemia and Hypertension   HPI Jacqueline Ferrell presents for f/up ----  Discussed the use of AI scribe software for clinical note transcription with the patient, who gave verbal consent to proceed.  History of Present Illness   The patient, with a history of cardiac disease, presents with a chief complaint of toenail fungus. She reports that the condition has been present for a couple of years and has progressively worsened over time, leading to significant discomfort. She had previously received a prescription for the condition, but it appears to have been ineffective.  In addition to the toenail issue, the patient has been experiencing some challenges with organization and planning in her daily life. She has been working on Cabin [employer] and reorganizing her house after previously considering selling it. She reports that she is generally a very organized individual, and the current state of her house is not typical for her.  Regarding her cardiac condition, the patient denies any recent chest pain or shortness of breath. She has recently seen a cardiologist within the past two weeks, but the specifics of this visit are not detailed. Overall, the patient feels that she is doing better.       Outpatient Medications Prior to Visit  Medication Sig Dispense Refill   acetaminophen (TYLENOL) 500 MG tablet Take 1,000 mg by mouth every 12 (twelve) hours as needed for moderate pain.     aspirin EC 81 MG tablet Take 1 tablet (81 mg total) by mouth daily. Swallow whole. 90 tablet 1   atorvastatin (LIPITOR) 80 MG tablet Take 1 tablet (80 mg total) by mouth daily. 90 tablet 0   Bempedoic Acid-Ezetimibe (NEXLIZET) 180-10 MG TABS Take 1 tablet by mouth daily. 90 tablet 0   celecoxib (CELEBREX) 50 MG capsule TAKE 1 CAPSULE BY MOUTH TWICE A DAY 60 capsule 0   Cholecalciferol 50 MCG (2000 UT) TABS  Take 1 tablet (2,000 Units total) by mouth daily. 90 tablet 1   DULoxetine (CYMBALTA) 60 MG capsule Take 1 capsule (60 mg total) by mouth daily. 90 capsule 0   gabapentin (NEURONTIN) 300 MG capsule Take 1 capsule (300 mg total) by mouth 4 (four) times daily. 270 capsule 0   hydrALAZINE (APRESOLINE) 25 MG tablet TAKE 1 TABLET BY MOUTH THREE TIMES A DAY (Patient taking differently: Take 25 mg by mouth 2 (two) times daily. 3x's daily if diastolic BP is 90 or above) 270 tablet 1   Magnesium Oxide 250 MG TABS Take 1 tablet (250 mg total) by mouth at bedtime. 90 tablet 1   Multiple Vitamin (MULTIVITAMIN WITH MINERALS) TABS tablet Take 1 tablet by mouth daily.     tiZANidine (ZANAFLEX) 4 MG tablet TAKE 1 TABLET BY MOUTH AT BEDTIME. 90 tablet 1   triamterene-hydrochlorothiazide (DYAZIDE) 37.5-25 MG capsule Take 1 each (1 capsule total) by mouth daily. TAKE 1 CAPSULE BY MOUTH EVERY DAY. *INS ONLY COVERS 21 DS* 90 capsule 0   No facility-administered medications prior to visit.    ROS Review of Systems  Constitutional: Negative.  Negative for appetite change, fatigue and unexpected weight change.  HENT: Negative.    Respiratory: Negative.  Negative for chest tightness, shortness of breath and wheezing.   Cardiovascular:  Negative for chest pain, palpitations and leg swelling.  Gastrointestinal: Negative.  Negative for abdominal pain, constipation, diarrhea and vomiting.  Endocrine: Negative.  Genitourinary: Negative.  Negative for difficulty urinating.  Musculoskeletal:  Positive for arthralgias and gait problem. Negative for joint swelling and myalgias.  Neurological:  Negative for dizziness and weakness.  Hematological:  Negative for adenopathy. Does not bruise/bleed easily.  Psychiatric/Behavioral: Negative.      Objective:  BP 136/88 (BP Location: Left Arm, Patient Position: Sitting)   Pulse 71   Temp 98.1 F (36.7 C) (Oral)   Resp 16   Wt 270 lb (122.5 kg) Comment: Patient refused weight   LMP 02/10/2008   SpO2 97%   BMI 43.58 kg/m   BP Readings from Last 3 Encounters:  08/04/23 136/88  07/20/23 134/78  05/10/23 (!) 162/88    Wt Readings from Last 3 Encounters:  08/04/23 270 lb (122.5 kg)  07/20/23 269 lb (122 kg)  05/10/23 264 lb (119.7 kg)    Physical Exam Vitals reviewed.  Constitutional:      Appearance: Normal appearance.  HENT:     Mouth/Throat:     Mouth: Mucous membranes are moist.  Eyes:     General: No scleral icterus.    Conjunctiva/sclera: Conjunctivae normal.  Cardiovascular:     Rate and Rhythm: Normal rate and regular rhythm.     Pulses:          Dorsalis pedis pulses are 1+ on the right side and 1+ on the left side.       Posterior tibial pulses are 1+ on the right side and 1+ on the left side.     Heart sounds: No murmur heard.    No gallop.  Pulmonary:     Effort: Pulmonary effort is normal.     Breath sounds: No stridor. No wheezing, rhonchi or rales.  Abdominal:     General: Abdomen is flat.     Palpations: There is no mass.     Tenderness: There is no abdominal tenderness. There is no guarding.     Hernia: No hernia is present.  Musculoskeletal:        General: Normal range of motion.     Cervical back: Neck supple.     Right lower leg: No edema.     Left lower leg: No edema.  Feet:     Right foot:     Skin integrity: Skin integrity normal.     Toenail Condition: Right toenails are abnormally thick. Fungal disease present.    Left foot:     Skin integrity: Skin integrity normal.     Toenail Condition: Left toenails are abnormally thick. Fungal disease present. Lymphadenopathy:     Cervical: No cervical adenopathy.  Skin:    General: Skin is warm and dry.  Neurological:     General: No focal deficit present.     Mental Status: She is alert. Mental status is at baseline.  Psychiatric:        Mood and Affect: Mood normal.        Behavior: Behavior normal.     Lab Results  Component Value Date   WBC 5.6 05/10/2023    HGB 14.5 05/10/2023   HCT 44.3 05/10/2023   PLT 236.0 05/10/2023   GLUCOSE 87 08/04/2023   CHOL 149 08/04/2023   TRIG 95.0 08/04/2023   HDL 44.40 08/04/2023   LDLCALC 85 08/04/2023   ALT 21 05/10/2023   AST 24 05/10/2023   NA 142 08/04/2023   K 3.7 08/04/2023   CL 105 08/04/2023   CREATININE 0.82 08/04/2023   BUN 19 08/04/2023   CO2 28  08/04/2023   TSH 1.69 08/04/2023   INR 1.1 (H) 09/11/2010   HGBA1C 5.5 01/11/2019    MR Lumbar Spine W Wo Contrast Result Date: 04/29/2021 CLINICAL DATA:  Lower back pain EXAM: MRI LUMBAR SPINE WITHOUT AND WITH CONTRAST TECHNIQUE: Multiplanar and multiecho pulse sequences of the lumbar spine were obtained without and with intravenous contrast. CONTRAST:  20mL MULTIHANCE GADOBENATE DIMEGLUMINE 529 MG/ML IV SOLN COMPARISON:  Lumbar spine MRI 06/24/2017 FINDINGS: Segmentation:  The lowest formed disc space is designated L5-S1. Alignment: There is 6 mm anterolisthesis of L4 on L5 and 4 mm retrolisthesis of L5 on S1, not significantly changed since 2018. Alignment at the other levels is normal. Vertebrae: There is degenerative endplate marrow signal abnormality at L4-5 and L5-S1. There is no suspicious marrow signal abnormality. There is no abnormal marrow enhancement. Conus medullaris and cauda equina: Conus extends to the L1-L2 level. Conus and cauda equina appear normal. Paraspinal and other soft tissues: Multiple T2 hyperintense lesions in the kidneys likely reflects cysts. The paraspinal soft tissues are unremarkable. Disc levels: There is multilevel facet arthropathy, most advanced at L4-L5 and L5-S1. There are trace effusions at L5-S1. 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 T12-L1: Mild congenital canal stenosis. No significant neural foraminal stenosis L1-L2: Mild congenital canal stenosis. No significant neural foraminal stenosis. L2-L3: There is a minimal disc bulge and bilateral facet arthropathy superimposed on congenital canal  stenosis resulting in mild to moderate spinal canal stenosis without significant neural foraminal stenosis. Findings are overall not significantly changed. L3-L4: There is a diffuse disc bulge with a left foraminal/extraforaminal component, ligamentum flavum thickening, and bilateral facet arthropathy resulting in moderate spinal canal stenosis with crowding of the subarticular zones, left worse than right, and moderate left and mild right neural foraminal stenosis. Findings are overall not significantly changed L4-L5: There is uncovering of the disc posteriorly with a mild superimposed bulge, ligamentum flavum thickening, degenerative endplate change, and bilateral facet arthropathy resulting in severe spinal canal stenosis with compression of the cauda equina nerve roots and moderate to severe left and moderate right neural foraminal stenosis. Findings are not significantly changed. L5-S1: There is a prominent diffuse disc bulge, degenerative endplate change, and bilateral facet arthropathy resulting in mild spinal canal stenosis with crowding of the bilateral subarticular zones and possible mass effect on the traversing S1 nerve roots and moderate to severe right worse than left neural foraminal stenosis. Findings are overall not significantly changed. L1-L2: No significant spinal canal or neural foraminal stenosis. IMPRESSION: 1. Multilevel spondylosis and spondylolisthesis are overall not significantly changed compared to the study from 2018. 2. Severe spinal canal stenosis with compression of the cauda equina nerve roots at L4-L5, and moderate spinal canal stenosis at L2-L3 and L3-L4 due to above described degenerative changes superimposed on congenital narrowing of the spinal canal. 3. Moderate left neural foraminal stenosis at L3-L4, severe left and moderate right neural foraminal stenosis at L4-L5, and severe right worse than left neural foraminal stenosis at L5-S1. 4. Multilevel facet arthropathy, most  advanced at L4-L5. Trace bilateral facet joint effusions at L5-S1. Electronically Signed   By: Lesia Hausen M.D.   On: 04/29/2021 11:45    Assessment & Plan:  Screening mammogram for breast cancer -     Digital Screening Mammogram, Left and Right; Future  Onychomycosis of toenail -     Fluconazole; Take 2 tablets (300 mg total) by mouth once a week.  Dispense: 24 tablet; Refill: 0  Hyperlipidemia with target  LDL less than 70 - LDL goal achieved. Doing well on the statin  -     TSH; Future -     Lipid panel; Future  Atherosclerosis of native coronary artery of native heart without angina pectoris -     Lipid panel; Future  Primary hypertension -     Basic metabolic panel; Future  Need for hepatitis C screening test -     Hepatitis C antibody; Future     Follow-up: Return in about 3 months (around 11/02/2023).  Sanda Linger, MD

## 2023-08-05 ENCOUNTER — Encounter: Payer: Self-pay | Admitting: Internal Medicine

## 2023-08-05 LAB — TSH: TSH: 1.69 u[IU]/mL (ref 0.35–5.50)

## 2023-08-05 LAB — LIPID PANEL
Cholesterol: 149 mg/dL (ref 0–200)
HDL: 44.4 mg/dL (ref >39.00)
LDL Cholesterol: 85 mg/dL (ref 0–99)
NonHDL: 104.4
Total CHOL/HDL Ratio: 3
Triglycerides: 95 mg/dL (ref 0.0–149.0)
VLDL: 19 mg/dL (ref 0.0–40.0)

## 2023-08-05 LAB — BASIC METABOLIC PANEL
BUN: 19 mg/dL (ref 6–23)
CO2: 28 meq/L (ref 19–32)
Calcium: 9.3 mg/dL (ref 8.4–10.5)
Chloride: 105 meq/L (ref 96–112)
Creatinine, Ser: 0.82 mg/dL (ref 0.40–1.20)
GFR: 79.95 mL/min (ref >60.00)
Glucose, Bld: 87 mg/dL (ref 70–99)
Potassium: 3.7 meq/L (ref 3.5–5.1)
Sodium: 142 meq/L (ref 135–145)

## 2023-08-05 LAB — HEPATITIS C ANTIBODY: Hepatitis C Ab: NONREACTIVE

## 2023-08-19 ENCOUNTER — Ambulatory Visit
Admission: RE | Admit: 2023-08-19 | Discharge: 2023-08-19 | Disposition: A | Discharge status: Home / Self Care | Payer: 59 | Source: Ambulatory Visit | Attending: Internal Medicine | Admitting: Internal Medicine

## 2023-08-19 DIAGNOSIS — Z1231 Encounter for screening mammogram for malignant neoplasm of breast: Secondary | ICD-10-CM

## 2023-08-24 ENCOUNTER — Telehealth: Payer: 59 | Admitting: Family

## 2023-08-24 DIAGNOSIS — A084 Viral intestinal infection, unspecified: Secondary | ICD-10-CM

## 2023-08-24 MED ORDER — ONDANSETRON HCL 4 MG PO TABS: 4.0000 mg | ORAL_TABLET | Freq: Three times a day (TID) | ORAL | 0 refills | Status: DC | PRN

## 2023-08-24 NOTE — Progress Notes (Signed)
Virtual Visit Consent   Jacqueline Ferrell, you are scheduled for a virtual visit with a [facility] provider today. Just as with appointments in the office, your consent must be obtained to participate. Your consent will be active for this visit and any virtual visit you may have with one of our providers in the next 365 days. If you have a MyChart account, a copy of this consent can be sent to you electronically.  As this is a virtual visit, video technology does not allow for your provider to perform a traditional examination. This may limit your provider's ability to fully assess your condition. If your provider identifies any concerns that need to be evaluated in person or the need to arrange testing (such as labs, EKG, etc.), we will make arrangements to do so. Although advances in technology are sophisticated, we cannot ensure that it will always work on either your end or our end. If the connection with a video visit is poor, the visit may have to be switched to a telephone visit. With either a video or telephone visit, we are not always able to ensure that we have a secure connection.  By engaging in this virtual visit, you consent to the provision of healthcare and authorize for your insurance to be billed (if applicable) for the services provided during this visit. Depending on your insurance coverage, you may receive a charge related to this service.  I need to obtain your verbal consent now. Are you willing to proceed with your visit today? Jacqueline Ferrell has provided verbal consent on 08/24/2023 for a virtual visit (video or telephone). Jannifer Rodney, FNP  Date: 08/24/2023 3:26 PM   Virtual Visit via Video Note   I, Jannifer Rodney, connected with  Jacqueline Ferrell  ([id number], 07/22/1965) on 08/24/23 at  3:15 PM EST by a video-enabled telemedicine application and verified that I am speaking with the correct person using two identifiers.  Location: Patient: Virtual Visit Location Patient:  Home Provider: Virtual Visit Location Provider: Home Office   I discussed the limitations of evaluation and management by telemedicine and the availability of in person appointments. The patient expressed understanding and agreed to proceed.    History of Present Illness: Jacqueline Ferrell is a 58 y.o. who identifies as a female who was assigned female at birth, and is being seen today for nausea, vomiting, and diarrhea that started three days ago. 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 She has not vomited was yesterday.   HPI: Emesis  This is a new problem. The current episode started in the past 7 days. Associated symptoms include abdominal pain (Sunday and Monday, but improved), diarrhea and a fever (resolved now). Pertinent negatives include no chills.  Diarrhea  This is a new problem. The current episode started in the past 7 days. The problem occurs 5 to 10 times per day. The problem has been unchanged. The stool consistency is described as Watery. Associated symptoms include abdominal pain (Sunday and Monday, but improved), a fever (resolved now) and vomiting. Pertinent negatives include no bloating or chills.    Problems:  Patient Active Problem List   Diagnosis Date Noted   Screening mammogram for breast cancer 08/04/2023   Onychomycosis of toenail 08/04/2023   Screen for colon cancer 05/21/2021   Primary osteoarthritis of both knees 05/21/2021   Facet arthropathy, lumbar 04/14/2021   Nocturnal leg cramps 12/27/2019   Vitamin D deficiency disease  02/22/2018   Screening for cervical cancer 09/20/2017   Spinal stenosis, lumbar region, with neurogenic claudication 08/22/2014   Morbid obesity (HCC) 10/09/2013   Visit for screening mammogram 09/13/2013   DJD (degenerative joint disease) of knee 01/27/2012   OSA (obstructive sleep apnea) 12/24/2011   Hypertension 01/04/2011   CAD, NATIVE VESSEL 09/28/2010   Hyperlipidemia with target LDL less than  70 07/28/2010    Allergies:  Allergies  Allergen Reactions   Lisinopril Other (See Comments)    REACTION: Tongue swelling   Medications:  Current Outpatient Medications:    ondansetron (ZOFRAN) 4 MG tablet, Take 1 tablet (4 mg total) by mouth every 8 (eight) hours as needed for nausea or vomiting., Disp: 20 tablet, Rfl: 0   acetaminophen (TYLENOL) 500 MG tablet, Take 1,000 mg by mouth every 12 (twelve) hours as needed for moderate pain., Disp: , Rfl:    aspirin EC 81 MG tablet, Take 1 tablet (81 mg total) by mouth daily. Swallow whole., Disp: 90 tablet, Rfl: 1   atorvastatin (LIPITOR) 80 MG tablet, Take 1 tablet (80 mg total) by mouth daily., Disp: 90 tablet, Rfl: 0   Bempedoic Acid-Ezetimibe (NEXLIZET) 180-10 MG TABS, Take 1 tablet by mouth daily., Disp: 90 tablet, Rfl: 0   celecoxib (CELEBREX) 50 MG capsule, TAKE 1 CAPSULE BY MOUTH TWICE A DAY, Disp: 60 capsule, Rfl: 0   Cholecalciferol 50 MCG (2000 UT) TABS, Take 1 tablet (2,000 Units total) by mouth daily., Disp: 90 tablet, Rfl: 1   DULoxetine (CYMBALTA) 60 MG capsule, Take 1 capsule (60 mg total) by mouth daily., Disp: 90 capsule, Rfl: 0   fluconazole (DIFLUCAN) 150 MG tablet, Take 2 tablets (300 mg total) by mouth once a week., Disp: 24 tablet, Rfl: 0   gabapentin (NEURONTIN) 300 MG capsule, Take 1 capsule (300 mg total) by mouth 4 (four) times daily., Disp: 270 capsule, Rfl: 0   hydrALAZINE (APRESOLINE) 25 MG tablet, TAKE 1 TABLET BY MOUTH THREE TIMES A DAY (Patient taking differently: Take 25 mg by mouth 2 (two) times daily. 3x's daily if diastolic BP is 90 or above), Disp: 270 tablet, Rfl: 1   Magnesium Oxide 250 MG TABS, Take 1 tablet (250 mg total) by mouth at bedtime., Disp: 90 tablet, Rfl: 1   Multiple Vitamin (MULTIVITAMIN WITH MINERALS) TABS tablet, Take 1 tablet by mouth daily., Disp: , Rfl:    tiZANidine (ZANAFLEX) 4 MG tablet, TAKE 1 TABLET BY MOUTH AT BEDTIME., Disp: 90 tablet, Rfl: 1   triamterene-hydrochlorothiazide  (DYAZIDE) 37.5-25 MG capsule, Take 1 each (1 capsule total) by mouth daily. TAKE 1 CAPSULE BY MOUTH EVERY DAY. *INS ONLY COVERS 21 DS*, Disp: 90 capsule, Rfl: 0  Observations/Objective: Patient is well-developed, well-nourished in no acute distress.  Resting comfortably  at home.  Head is normocephalic, atraumatic.  No labored breathing.  Speech is clear and coherent with logical content.  Patient is alert and oriented at baseline.    Assessment and Plan: 1. Viral gastroenteritis (Primary) - ondansetron (ZOFRAN) 4 MG tablet; Take 1 tablet (4 mg total) by mouth every 8 (eight) hours as needed for nausea or vomiting.  Dispense: 20 tablet; Refill: 0  Rest Force fluids BRAT diet  Zofran as needed  Imodium as needed  Work note given  Follow up if symptoms worsen or do not improve   Follow Up Instructions: I discussed the assessment and treatment plan with the patient. The patient was provided an opportunity to ask questions and all were answered. The  patient agreed with the plan and demonstrated an understanding of the instructions.  A copy of instructions were sent to the patient via MyChart unless otherwise noted below.     The patient was advised to call back or seek an in-person evaluation if the symptoms worsen or if the condition fails to improve as anticipated.    Jannifer Rodney, FNP

## 2023-08-24 NOTE — Patient Instructions (Signed)

## 2023-08-25 ENCOUNTER — Ambulatory Visit: Payer: Self-pay

## 2023-08-25 ENCOUNTER — Telehealth: Payer: Self-pay

## 2023-08-25 NOTE — Patient Instructions (Signed)
Visit Information  Thank you for taking time to visit with me today. Please don't hesitate to contact me if I can be of assistance to you.   Following are the goals we discussed today:  Follow instructions per provider at video visit on 08/24/23 re: Viral Gastroenteritis. Notify provider if symptoms worsen or do not improve. Continue to take medications as prescribed. Continue to attend provider visits as scheduled Continue to eat healthy Contact provider with health questions or concerns as needed  If you are experiencing a Mental Health or Behavioral Health Crisis or need someone to talk to, please call the Suicide and Crisis Lifeline: 988 call the [place] National Suicide Prevention Lifeline: 772-255-2060 or TTY: (620)085-7161 TTY 252-688-4153) to talk to a trained counselor  Kathyrn Sheriff, RN, MSN, BSN, CCM [place]  [facility], Population Health Case Manager Phone: [phone]

## 2023-08-25 NOTE — Patient Outreach (Signed)
  Care Coordination   Follow Up Visit Note   08/25/2023 Name: Jacqueline Ferrell MRN: [id number] DOB: 1966/06/01  Jacqueline Ferrell is a 58 y.o. year old female who sees Jacqueline Grandchild, MD for primary care. I spoke with  Jacqueline Ferrell by phone today.  What matters to the patients health and wellness today?  Jacqueline Ferrell reports "I am doing better. Everything is good". She states, "I am fine as far as my mental health is concerned. I am more organized and coping better. I am back on track and all the crying has stopped". Video visit completed yesterday regarding viral gastroenteritis. Patient states, "it is better, diarrhea has stopped and vomiting has stopped". She denies any issues or concerns at this time. Patient to contact RNCM if care management needs in the future.  Goals Addressed             This Visit's Progress    COMPLETED: Assist with health management       Interventions Today    Flowsheet Row Most Recent Value  Chronic Disease   Chronic disease during today's visit Hypertension (HTN), Other  General Interventions   General Interventions Discussed/Reviewed General Interventions Reviewed, Doctor Visits  [Evaluation of current treatment plan for health condition and patient's adherence to plan.]  Doctor Visits Discussed/Reviewed Doctor Visits Reviewed, PCP, Specialist  PCP/Specialist Visits Compliance with follow-up visit  [reviewed upcoming appointments]  Exercise Interventions   Exercise Discussed/Reviewed Physical Activity  [encouraged to remain active as tolerated]  Education Interventions   Education Provided Provided Education  Provided Verbal Education On Mental Health/Coping with Illness, Medication, When to see the doctor, Other  [advised to continue to take medications as prescribed, eat healthy, contact provider for health questions or concerns. reviewed instructions per video visit on 08/24/23. patient confirmed understanding.]  Nutrition Interventions   Nutrition  Discussed/Reviewed Nutrition Reviewed  Pharmacy Interventions   Pharmacy Dicussed/Reviewed Pharmacy Topics Discussed  [medications reviewed per provider during video visit  re: viral gastroenteritis on yesterday.]            SDOH assessments and interventions completed:  No  Care Coordination Interventions:  Yes, provided   Follow up plan: No further intervention required.   Encounter Outcome:  Patient Visit Completed   Kathyrn Sheriff, RN, MSN, BSN, CCM [place]  [facility], Population Health Case Manager Phone: [phone]

## 2023-08-25 NOTE — Patient Outreach (Signed)
  Care Coordination   08/25/2023 Name: Jacqueline Ferrell MRN: [id number] DOB: 04/28/1966   Care Coordination Outreach Attempts:  An unsuccessful outreach was attempted for an appointment today.  Follow Up Plan:  Additional outreach attempts will be made to offer the patient complex care management information and services.   Encounter Outcome:  No Answer   Care Coordination Interventions:  No, not indicated    Kathyrn Sheriff, RN, MSN, BSN, CCM [place]  [facility], Population Health Case Manager Phone: [phone]

## 2023-09-14 ENCOUNTER — Other Ambulatory Visit: Payer: Self-pay | Admitting: Internal Medicine

## 2023-09-14 DIAGNOSIS — M48062 Spinal stenosis, lumbar region with neurogenic claudication: Secondary | ICD-10-CM

## 2023-09-14 DIAGNOSIS — M170 Bilateral primary osteoarthritis of knee: Secondary | ICD-10-CM

## 2023-09-14 DIAGNOSIS — F322 Major depressive disorder, single episode, severe without psychotic features: Secondary | ICD-10-CM

## 2023-09-15 LAB — COLOGUARD

## 2023-09-23 ENCOUNTER — Other Ambulatory Visit: Payer: Self-pay | Admitting: Internal Medicine

## 2023-09-23 DIAGNOSIS — M170 Bilateral primary osteoarthritis of knee: Secondary | ICD-10-CM

## 2023-10-09 ENCOUNTER — Other Ambulatory Visit: Payer: Self-pay | Admitting: Internal Medicine

## 2023-10-09 DIAGNOSIS — F322 Major depressive disorder, single episode, severe without psychotic features: Secondary | ICD-10-CM

## 2023-10-09 DIAGNOSIS — M170 Bilateral primary osteoarthritis of knee: Secondary | ICD-10-CM

## 2023-10-10 NOTE — Telephone Encounter (Signed)
 LVM for patient to return call, due for F/U

## 2023-10-11 ENCOUNTER — Ambulatory Visit: Payer: Self-pay | Attending: Internal Medicine | Admitting: Internal Medicine

## 2023-10-11 ENCOUNTER — Encounter: Payer: Self-pay | Admitting: Internal Medicine

## 2023-10-11 VITALS — BP 122/82 | HR 64 | Ht 66.0 in | Wt 267.2 lb

## 2023-10-11 DIAGNOSIS — I10 Essential (primary) hypertension: Secondary | ICD-10-CM | POA: Diagnosis not present

## 2023-10-11 DIAGNOSIS — I2510 Atherosclerotic heart disease of native coronary artery without angina pectoris: Secondary | ICD-10-CM | POA: Diagnosis not present

## 2023-10-11 DIAGNOSIS — E785 Hyperlipidemia, unspecified: Secondary | ICD-10-CM

## 2023-10-11 NOTE — Patient Instructions (Signed)
 Medication Instructions:  NO CHANGES  *If you need a refill on your cardiac medications before your next appointment, please call your pharmacy*  Follow-Up: At [facility], you and your health needs are our priority.  As part of our continuing mission to provide you with exceptional heart care, our providers are all part of one team.  This team includes your primary Cardiologist (physician) and Advanced Practice Providers or APPs (Physician Assistants and Nurse Practitioners) who all work together to provide you with the care you need, when you need it.  Your next appointment:   AS NEEDED with Dr. Rennis Golden for lipid management   We recommend signing up for the patient portal called "MyChart".  Sign up information is provided on this After Visit Summary.  MyChart is used to connect with patients for Virtual Visits (Telemedicine).  Patients are able to view lab/test results, encounter notes, upcoming appointments, etc.  Non-urgent messages can be sent to your provider as well.   To learn more about what you can do with MyChart, go to [email or web address].        1st Floor: - Lobby - Registration  - Pharmacy  - Lab - Cafe  2nd Floor: - PV Lab - Diagnostic Testing (echo, CT, nuclear med)  3rd Floor: - Vacant  4th Floor: - TCTS (cardiothoracic surgery) - AFib Clinic - Structural Heart Clinic - Vascular Surgery  - Vascular Ultrasound  5th Floor: - HeartCare Cardiology (general and EP) - Clinical Pharmacy for coumadin, hypertension, lipid, weight-loss medications, and med management appointments    Valet parking services will be available as well.

## 2023-10-11 NOTE — Progress Notes (Signed)
 OFFICE NOTE  Chief Complaint:  Follow-up lipids  Primary Care Physician: Etta Grandchild, MD  HPI:  Jacqueline Ferrell is a has a history of coronary artery disease with prior DES to the LAD in 2012.  She previously followed with Dr. Anne Fu but was lost to follow-up and recently reestablished care with Dr. Antoine Poche.  She also had morbid obesity but had bariatric surgery and made dietary changes and ultimately lost 100 pounds.  Overall she says she feels well denies any anginal symptoms.  Previously she had been on high-dose atorvastatin 80 mg daily but did not reach a target LDL less than 70.  She was subsequently was started on Repatha 140 mg every 2 weeks with marked improvement in her lipids and an LDL cholesterol of 77 approximately 2 years ago.  Unfortunately she ran out of the medication due to loss of follow-up and her LDL has now rebounded to 151.  She reported no significant side effects on Repatha and sounds like she was initially getting some support from [employer].  06/30/2020  [name] is seen today via telephone follow-up.  We were able to successfully reestablish her Repatha.  She is able to get this at an affordable cost.  She has been using it now for over 2 months without any side effects.  Unfortunately we have not had repeat labs to reassess.  I would like to see if we can get her LDL below 70 and she may need some additional therapy to achieve this.  10/11/2023  [name] is seen today in follow-up.  Unfortunately she says she cannot longer afford the Repatha although it was quite effective for her.  I do not know if the issue was not getting a prior authorization or not.  She is subsequently was then started on Nexlizet in addition to her atorvastatin by her primary care provider.  She seems to be tolerating this well and had lipids in January with an LDL of 85 although had just recently started the medication.  I expect her next lipid profile will be quite a bit better.  PMHx:   Past Medical History:  Diagnosis Date   Abnormal Pap smear of cervix    in her 20's   Anemia    Arthritis    Bulging lumbar disc    Coronary artery disease    Fibroid    Hyperlipidemia    Hypertension    Migraines    in the past   Nephrolithiasis    Obese    PCOS (polycystic ovarian syndrome)    Poor compliance with medication     Past Surgical History:  Procedure Laterality Date   ABDOMINAL HYSTERECTOMY  2009   BARIATRIC SURGERY     CORONARY ANGIOPLASTY WITH STENT PLACEMENT     feet surgery     corrective surgery as a child   TUBAL LIGATION      FAMHx:  Family History  Problem Relation Age of Onset   Hypertension Mother    Breast cancer Mother    Heart attack Father        46's, 50 & age 77   Heart disease Father    Diabetes Father    Hypertension Father    Hypertension Brother    Hypertension Brother    Breast cancer Paternal Aunt    Breast cancer Maternal Grandmother    Stroke Maternal Grandfather    Other Paternal Grandmother        female cancer   Colon  cancer Paternal Grandfather    Diabetes Daughter    Hypertension Daughter    Heart attack Cousin        5 first cousins with MI's in their 70s    SOCHx:   reports that she has never smoked. She has never been exposed to tobacco smoke. She has never used smokeless tobacco. She reports current alcohol use of about 2.0 standard drinks of alcohol per week. She reports that she does not use drugs.  ALLERGIES:  Allergies  Allergen Reactions   Lisinopril Other (See Comments)    REACTION: Tongue swelling    ROS: Pertinent items noted in HPI and remainder of comprehensive ROS otherwise negative.  HOME MEDS: Current Outpatient Medications on File Prior to Visit  Medication Sig Dispense Refill   acetaminophen (TYLENOL) 500 MG tablet Take 1,000 mg by mouth every 12 (twelve) hours as needed for moderate pain.     aspirin EC 81 MG tablet Take 1 tablet (81 mg total) by mouth daily. Swallow whole. 90 tablet  1   atorvastatin (LIPITOR) 80 MG tablet Take 1 tablet (80 mg total) by mouth daily. 90 tablet 0   Bempedoic Acid-Ezetimibe (NEXLIZET) 180-10 MG TABS Take 1 tablet by mouth daily. 90 tablet 0   celecoxib (CELEBREX) 50 MG capsule TAKE 1 CAPSULE BY MOUTH TWICE A DAY 60 capsule 0   Cholecalciferol 50 MCG (2000 UT) TABS Take 1 tablet (2,000 Units total) by mouth daily. 90 tablet 1   DULoxetine (CYMBALTA) 60 MG capsule TAKE 1 CAPSULE BY MOUTH EVERY DAY 90 capsule 0   fluconazole (DIFLUCAN) 150 MG tablet Take 2 tablets (300 mg total) by mouth once a week. 24 tablet 0   gabapentin (NEURONTIN) 300 MG capsule TAKE 1 CAPSULE BY MOUTH 4 TIMES DAILY. 120 capsule 2   hydrALAZINE (APRESOLINE) 25 MG tablet TAKE 1 TABLET BY MOUTH THREE TIMES A DAY (Patient taking differently: Take 25 mg by mouth 2 (two) times daily. 3x's daily if diastolic BP is 90 or above) 270 tablet 1   Magnesium Oxide 250 MG TABS Take 1 tablet (250 mg total) by mouth at bedtime. 90 tablet 1   Multiple Vitamin (MULTIVITAMIN WITH MINERALS) TABS tablet Take 1 tablet by mouth daily.     tiZANidine (ZANAFLEX) 4 MG tablet TAKE 1 TABLET BY MOUTH AT BEDTIME. 90 tablet 1   triamterene-hydrochlorothiazide (DYAZIDE) 37.5-25 MG capsule Take 1 each (1 capsule total) by mouth daily. TAKE 1 CAPSULE BY MOUTH EVERY DAY. *INS ONLY COVERS 21 DS* 90 capsule 0   DULoxetine (CYMBALTA) 30 MG capsule TAKE 1 CAPSULE BY MOUTH EVERY DAY 30 capsule 0   ondansetron (ZOFRAN) 4 MG tablet Take 1 tablet (4 mg total) by mouth every 8 (eight) hours as needed for nausea or vomiting. 20 tablet 0   No current facility-administered medications on file prior to visit.    LABS/IMAGING: No results found for this or any previous visit (from the past 48 hours). No results found.  LIPID PANEL:    Component Value Date/Time   CHOL 149 08/04/2023 1616   CHOL 129 12/07/2017 0943   TRIG 95.0 08/04/2023 1616   TRIG 81 07/28/2010 1630   HDL 44.40 08/04/2023 1616   HDL 41 12/07/2017  0943   CHOLHDL 3 08/04/2023 1616   VLDL 19.0 08/04/2023 1616   LDLCALC 85 08/04/2023 1616   LDLCALC 77 12/07/2017 0943     WEIGHTS: Wt Readings from Last 3 Encounters:  10/11/23 267 lb 3.2 oz (121.2 kg)  08/04/23 270 lb (122.5 kg)  07/20/23 269 lb (122 kg)    VITALS: BP 122/82 (BP Location: Left Arm, Patient Position: Sitting, Cuff Size: Large)   Pulse 64   Ht 5\' 6"  (1.676 m)   Wt 267 lb 3.2 oz (121.2 kg)   LMP 02/10/2008   SpO2 98%   BMI 43.13 kg/m   EXAM: Deferred  EKG: Deferred  ASSESSMENT: Mixed dyslipidemia, possible familial hyperlipidemia-goal LDL less than 70 CAD with DES to the LAD in 2012 Morbid obesity with recent 100 pound weight loss status post bariatric surgery Essential hypertension  PLAN: 1.   Ms. Meske was doing well on Repatha but however the cost became prohibitive.  Is not clear if this was due to not getting a prior authorization or not.  Otherwise she was switched to Nexlizet by primary care provider who is also prescribing her atorvastatin.  LDL was 85 in January however she was only on the medicine for a few weeks.  I expect it will be lower.  She says she has a follow-up next month and should probably have a repeat lipids through her PCP at that time.  I do not have any other suggestions regarding lipid management for her.  She does see Dr. Antoine Poche for follow-up general cardiology.  Follow-up with me as needed.  Chrystie Nose, MD, [facility] FACP  [place]  [facility]  Medical Director of the Advanced Lipid Disorders &  Cardiovascular Risk Reduction Clinic Diplomate of the American Board of Clinical Lipidology Attending Cardiologist  Direct Dial: [phone]  Fax: [phone]  Website:  [email or web address] 10/11/2023, 9:49 AM

## 2023-10-31 ENCOUNTER — Ambulatory Visit: Admitting: Internal Medicine

## 2023-10-31 ENCOUNTER — Encounter: Payer: Self-pay | Admitting: Internal Medicine

## 2023-10-31 VITALS — BP 144/84 | HR 64 | Temp 98.2°F | Resp 16 | Ht 66.0 in | Wt 267.0 lb

## 2023-10-31 DIAGNOSIS — E785 Hyperlipidemia, unspecified: Secondary | ICD-10-CM

## 2023-10-31 DIAGNOSIS — Z23 Encounter for immunization: Secondary | ICD-10-CM

## 2023-10-31 DIAGNOSIS — K625 Hemorrhage of anus and rectum: Secondary | ICD-10-CM | POA: Insufficient documentation

## 2023-10-31 DIAGNOSIS — Z800 Family history of malignant neoplasm of digestive organs: Secondary | ICD-10-CM | POA: Insufficient documentation

## 2023-10-31 DIAGNOSIS — K602 Anal fissure, unspecified: Secondary | ICD-10-CM | POA: Insufficient documentation

## 2023-10-31 DIAGNOSIS — R0683 Snoring: Secondary | ICD-10-CM | POA: Insufficient documentation

## 2023-10-31 DIAGNOSIS — I10 Essential (primary) hypertension: Secondary | ICD-10-CM

## 2023-10-31 LAB — CBC WITH DIFFERENTIAL/PLATELET
Basophils Absolute: 0 10*3/uL (ref 0.0–0.1)
Basophils Relative: 0.4 % (ref 0.0–3.0)
Eosinophils Absolute: 0 10*3/uL (ref 0.0–0.7)
Eosinophils Relative: 0.6 % (ref 0.0–5.0)
HCT: 40.7 % (ref 36.0–46.0)
Hemoglobin: 13.6 g/dL (ref 12.0–15.0)
Lymphocytes Relative: 41.4 % (ref 12.0–46.0)
Lymphs Abs: 2.4 10*3/uL (ref 0.7–4.0)
MCHC: 33.4 g/dL (ref 30.0–36.0)
MCV: 93.8 fl (ref 78.0–100.0)
Monocytes Absolute: 0.5 10*3/uL (ref 0.1–1.0)
Monocytes Relative: 8.3 % (ref 3.0–12.0)
Neutro Abs: 2.8 10*3/uL (ref 1.4–7.7)
Neutrophils Relative %: 49.3 % (ref 43.0–77.0)
Platelets: 255 10*3/uL (ref 150.0–400.0)
RBC: 4.35 Mil/uL (ref 3.87–5.11)
RDW: 13.4 % (ref 11.5–15.5)
WBC: 5.8 10*3/uL (ref 4.0–10.5)

## 2023-10-31 LAB — BASIC METABOLIC PANEL WITH GFR
BUN: 18 mg/dL (ref 6–23)
CO2: 29 meq/L (ref 19–32)
Calcium: 9.6 mg/dL (ref 8.4–10.5)
Chloride: 103 meq/L (ref 96–112)
Creatinine, Ser: 0.84 mg/dL (ref 0.40–1.20)
GFR: 77.54 mL/min (ref >60.00)
Glucose, Bld: 80 mg/dL (ref 70–99)
Potassium: 3.5 meq/L (ref 3.5–5.1)
Sodium: 140 meq/L (ref 135–145)

## 2023-10-31 LAB — CK: Total CK: 218 U/L — ABNORMAL HIGH (ref 7–177)

## 2023-10-31 NOTE — Progress Notes (Signed)
 Subjective:  Patient ID: Jacqueline Ferrell, female    DOB: 01-19-1966  Age: 58 y.o. MRN: [id number]  CC: Hypertension, Back Pain, Coronary Artery Disease, and Hyperlipidemia   HPI Jacqueline Ferrell presents for f/up ----  Discussed the use of AI scribe software for clinical note transcription with the patient, who gave verbal consent to proceed.  History of Present Illness   Jacqueline Ferrell is a 58 year old female who presents for a follow-up regarding pain management and sleep issues.  Her pain is generally well-controlled with medication, although she experiences increased pain during rainy weather, describing it as 'hurt all over'.  She experiences excessive sleepiness, particularly at work, which she attributes to her medications. She works night shifts and has a history of insomnia but is now sleeping better since starting Cymbalta . She sleeps 5 to 8 hours per night, compared to her previous norm of 2 to 4 hours. However, she experiences loud snoring that sometimes wakes her up, although this was present even before starting Cymbalta .  She reports significant leg swelling last week, which she attributes to eating out frequently due to a busy schedule. Her legs were 'very swollen', and she has since started meal prepping to avoid high salt intake. She takes triamterene /hydrochlorothiazide for fluid control.       Outpatient Medications Prior to Visit  Medication Sig Dispense Refill   acetaminophen  (TYLENOL ) 500 MG tablet Take 1,000 mg by mouth every 12 (twelve) hours as needed for moderate pain.     aspirin  EC 81 MG tablet Take 1 tablet (81 mg total) by mouth daily. Swallow whole. 90 tablet 1   atorvastatin  (LIPITOR) 80 MG tablet Take 1 tablet (80 mg total) by mouth daily. 90 tablet 0   Bempedoic Acid-Ezetimibe  (NEXLIZET ) 180-10 MG TABS Take 1 tablet by mouth daily. 90 tablet 0   celecoxib  (CELEBREX ) 50 MG capsule TAKE 1 CAPSULE BY MOUTH TWICE A DAY 60 capsule 0   Cholecalciferol  50 MCG  (2000 UT) TABS Take 1 tablet (2,000 Units total) by mouth daily. 90 tablet 1   DULoxetine  (CYMBALTA ) 60 MG capsule TAKE 1 CAPSULE BY MOUTH EVERY DAY 90 capsule 0   fluconazole  (DIFLUCAN ) 150 MG tablet Take 2 tablets (300 mg total) by mouth once a week. 24 tablet 0   gabapentin  (NEURONTIN ) 300 MG capsule TAKE 1 CAPSULE BY MOUTH 4 TIMES DAILY. 120 capsule 2   hydrALAZINE  (APRESOLINE ) 25 MG tablet TAKE 1 TABLET BY MOUTH THREE TIMES A DAY (Patient taking differently: Take 25 mg by mouth 2 (two) times daily. 3x's daily if diastolic BP is 90 or above) 270 tablet 1   Magnesium  Oxide 250 MG TABS Take 1 tablet (250 mg total) by mouth at bedtime. 90 tablet 1   Multiple Vitamin (MULTIVITAMIN WITH MINERALS) TABS tablet Take 1 tablet by mouth daily.     tiZANidine  (ZANAFLEX ) 4 MG tablet TAKE 1 TABLET BY MOUTH AT BEDTIME. 90 tablet 1   triamterene -hydrochlorothiazide (DYAZIDE) 37.5-25 MG capsule Take 1 each (1 capsule total) by mouth daily. TAKE 1 CAPSULE BY MOUTH EVERY DAY. *INS ONLY COVERS 21 DS* 90 capsule 0   DULoxetine  (CYMBALTA ) 30 MG capsule TAKE 1 CAPSULE BY MOUTH EVERY DAY 90 capsule 1   ondansetron  (ZOFRAN ) 4 MG tablet Take 1 tablet (4 mg total) by mouth every 8 (eight) hours as needed for nausea or vomiting. 20 tablet 0   No facility-administered medications prior to visit.    ROS Review of Systems  Constitutional:  Positive for  fatigue. Negative for appetite change, chills, diaphoresis, fever and unexpected weight change.  HENT: Negative.    Eyes: Negative.   Respiratory: Negative.  Negative for cough, chest tightness, shortness of breath and wheezing.   Cardiovascular:  Negative for chest pain, palpitations and leg swelling.  Gastrointestinal:  Negative for abdominal pain, blood in stool, constipation, diarrhea and vomiting.  Genitourinary: Negative.  Negative for difficulty urinating.  Musculoskeletal:  Positive for arthralgias, back pain and gait problem. Negative for myalgias.   Neurological:  Negative for dizziness and weakness.  Hematological:  Negative for adenopathy. Does not bruise/bleed easily.  Psychiatric/Behavioral: Negative.      Objective:  BP (!) 144/84 (BP Location: Left Arm, Patient Position: Sitting, Cuff Size: Large)   Pulse 64   Temp 98.2 F (36.8 C) (Oral)   Resp 16   Ht 5\' 6"  (1.676 m)   Wt 267 lb (121.1 kg)   LMP 02/10/2008   SpO2 97%   BMI 43.09 kg/m   BP Readings from Last 3 Encounters:  10/31/23 (!) 144/84  10/11/23 122/82  08/04/23 136/88    Wt Readings from Last 3 Encounters:  10/31/23 267 lb (121.1 kg)  10/11/23 267 lb 3.2 oz (121.2 kg)  08/04/23 270 lb (122.5 kg)    Physical Exam Vitals reviewed.  Constitutional:      Appearance: Normal appearance.  HENT:     Nose: Nose normal.     Mouth/Throat:     Mouth: Mucous membranes are moist.  Eyes:     General: No scleral icterus.    Conjunctiva/sclera: Conjunctivae normal.  Cardiovascular:     Rate and Rhythm: Normal rate and regular rhythm.     Heart sounds: No murmur heard.    No friction rub. No gallop.  Pulmonary:     Effort: Pulmonary effort is normal.     Breath sounds: No stridor. No wheezing, rhonchi or rales.  Abdominal:     General: Abdomen is flat.     Palpations: There is no mass.     Tenderness: There is no abdominal tenderness. There is no guarding.     Hernia: No hernia is present.  Musculoskeletal:        General: Deformity present.     Cervical back: Neck supple.     Right lower leg: No edema.     Left lower leg: No edema.  Lymphadenopathy:     Cervical: No cervical adenopathy.  Skin:    General: Skin is warm and dry.  Neurological:     General: No focal deficit present.     Mental Status: She is alert. Mental status is at baseline.  Psychiatric:        Mood and Affect: Mood normal.        Behavior: Behavior normal.     Lab Results  Component Value Date   WBC 5.8 10/31/2023   HGB 13.6 10/31/2023   HCT 40.7 10/31/2023   PLT  255.0 10/31/2023   GLUCOSE 80 10/31/2023   CHOL 149 08/04/2023   TRIG 95.0 08/04/2023   HDL 44.40 08/04/2023   LDLCALC 85 08/04/2023   ALT 21 05/10/2023   AST 24 05/10/2023   NA 140 10/31/2023   K 3.5 10/31/2023   CL 103 10/31/2023   CREATININE 0.84 10/31/2023   BUN 18 10/31/2023   CO2 29 10/31/2023   TSH 1.69 08/04/2023   INR 1.1 (H) 09/11/2010   HGBA1C 5.5 01/11/2019    MM 3D SCREENING MAMMOGRAM BILATERAL BREAST Result Date: 08/23/2023  CLINICAL DATA:  Screening. EXAM: DIGITAL SCREENING BILATERAL MAMMOGRAM WITH TOMOSYNTHESIS AND CAD TECHNIQUE: Bilateral screening digital craniocaudal and mediolateral oblique mammograms were obtained. Bilateral screening digital breast tomosynthesis was performed. The images were evaluated with computer-aided detection. COMPARISON:  Previous exam(s). ACR Breast Density Category c: The breasts are heterogeneously dense, which may obscure small masses. FINDINGS: There are no findings suspicious for malignancy. IMPRESSION: No mammographic evidence of malignancy. A result letter of this screening mammogram will be mailed directly to the patient. RECOMMENDATION: Screening mammogram in one year. (Code:SM-B-01Y) BI-RADS CATEGORY  1: Negative. Electronically Signed   By: Alinda Apley M.D.   On: 08/23/2023 15:18    Assessment & Plan:  Immunization due -     Pneumococcal conjugate vaccine 20-valent -     Varicella-zoster vaccine IM  Primary hypertension- Her BP is adequately well controlled. -     CBC with Differential/Platelet; Future -     Basic metabolic panel with GFR; Future  Loud snoring -     Ambulatory referral to Sleep Studies     Follow-up: Return in about 6 months (around 05/01/2024).  Sandra Blash, MD

## 2023-10-31 NOTE — Patient Instructions (Signed)
 Hypertension, Adult High blood pressure (hypertension) is when the force of blood pumping through the arteries is too strong. The arteries are the blood vessels that carry blood from the heart throughout the body. Hypertension forces the heart to work harder to pump blood and may cause arteries to become narrow or stiff. Untreated or uncontrolled hypertension can lead to a heart attack, heart failure, a stroke, kidney disease, and other problems. A blood pressure reading consists of a higher number over a lower number. Ideally, your blood pressure should be below 120/80. The first ("top") number is called the systolic pressure. It is a measure of the pressure in your arteries as your heart beats. The second ("bottom") number is called the diastolic pressure. It is a measure of the pressure in your arteries as the heart relaxes. What are the causes? The exact cause of this condition is not known. There are some conditions that result in high blood pressure. What increases the risk? Certain factors may make you more likely to develop high blood pressure. Some of these risk factors are under your control, including: Smoking. Not getting enough exercise or physical activity. Being overweight. Having too much fat, sugar, calories, or salt (sodium) in your diet. Drinking too much alcohol. Other risk factors include: Having a personal history of heart disease, diabetes, high cholesterol, or kidney disease. Stress. Having a family history of high blood pressure and high cholesterol. Having obstructive sleep apnea. Age. The risk increases with age. What are the signs or symptoms? High blood pressure may not cause symptoms. Very high blood pressure (hypertensive crisis) may cause: Headache. Fast or irregular heartbeats (palpitations). Shortness of breath. Nosebleed. Nausea and vomiting. Vision changes. Severe chest pain, dizziness, and seizures. How is this diagnosed? This condition is diagnosed by  measuring your blood pressure while you are seated, with your arm resting on a flat surface, your legs uncrossed, and your feet flat on the floor. The cuff of the blood pressure monitor will be placed directly against the skin of your upper arm at the level of your heart. Blood pressure should be measured at least twice using the same arm. Certain conditions can cause a difference in blood pressure between your right and left arms. If you have a high blood pressure reading during one visit or you have normal blood pressure with other risk factors, you may be asked to: Return on a different day to have your blood pressure checked again. Monitor your blood pressure at home for 1 week or longer. If you are diagnosed with hypertension, you may have other blood or imaging tests to help your health care provider understand your overall risk for other conditions. How is this treated? This condition is treated by making healthy lifestyle changes, such as eating healthy foods, exercising more, and reducing your alcohol intake. You may be referred for counseling on a healthy diet and physical activity. Your health care provider may prescribe medicine if lifestyle changes are not enough to get your blood pressure under control and if: Your systolic blood pressure is above 130. Your diastolic blood pressure is above 80. Your personal target blood pressure may vary depending on your medical conditions, your age, and other factors. Follow these instructions at home: Eating and drinking  Eat a diet that is high in fiber and potassium, and low in sodium, added sugar, and fat. An example of this eating plan is called the DASH diet. DASH stands for Dietary Approaches to Stop Hypertension. To eat this way: Eat  plenty of fresh fruits and vegetables. Try to fill one half of your plate at each meal with fruits and vegetables. Eat whole grains, such as whole-wheat pasta, brown rice, or whole-grain bread. Fill about one  fourth of your plate with whole grains. Eat or drink low-fat dairy products, such as skim milk or low-fat yogurt. Avoid fatty cuts of meat, processed or cured meats, and poultry with skin. Fill about one fourth of your plate with lean proteins, such as fish, chicken without skin, beans, eggs, or tofu. Avoid pre-made and processed foods. These tend to be higher in sodium, added sugar, and fat. Reduce your daily sodium intake. Many people with hypertension should eat less than 1,500 mg of sodium a day. Do not drink alcohol if: Your health care provider tells you not to drink. You are pregnant, may be pregnant, or are planning to become pregnant. If you drink alcohol: Limit how much you have to: 0-1 drink a day for women. 0-2 drinks a day for men. Know how much alcohol is in your drink. In the [place] one drink equals one 12 oz bottle of beer (355 mL), one 5 oz glass of wine (148 mL), or one 1 oz glass of hard liquor (44 mL). Lifestyle  Work with your health care provider to maintain a healthy body weight or to lose weight. Ask what an ideal weight is for you. Get at least 30 minutes of exercise that causes your heart to beat faster (aerobic exercise) most days of the week. Activities may include walking, swimming, or biking. Include exercise to strengthen your muscles (resistance exercise), such as Pilates or lifting weights, as part of your weekly exercise routine. Try to do these types of exercises for 30 minutes at least 3 days a week. Do not use any products that contain nicotine or tobacco. These products include cigarettes, chewing tobacco, and vaping devices, such as e-cigarettes. If you need help quitting, ask your health care provider. Monitor your blood pressure at home as told by your health care provider. Keep all follow-up visits. This is important. Medicines Take over-the-counter and prescription medicines only as told by your health care provider. Follow directions carefully. Blood  pressure medicines must be taken as prescribed. Do not skip doses of blood pressure medicine. Doing this puts you at risk for problems and can make the medicine less effective. Ask your health care provider about side effects or reactions to medicines that you should watch for. Contact a health care provider if you: Think you are having a reaction to a medicine you are taking. Have headaches that keep coming back (recurring). Feel dizzy. Have swelling in your ankles. Have trouble with your vision. Get help right away if you: Develop a severe headache or confusion. Have unusual weakness or numbness. Feel faint. Have severe pain in your chest or abdomen. Vomit repeatedly. Have trouble breathing. These symptoms may be an emergency. Get help right away. Call 911. Do not wait to see if the symptoms will go away. Do not drive yourself to the hospital. Summary Hypertension is when the force of blood pumping through your arteries is too strong. If this condition is not controlled, it may put you at risk for serious complications. Your personal target blood pressure may vary depending on your medical conditions, your age, and other factors. For most people, a normal blood pressure is less than 120/80. Hypertension is treated with lifestyle changes, medicines, or a combination of both. Lifestyle changes include losing weight, eating a healthy,  low-sodium diet, exercising more, and limiting alcohol. This information is not intended to replace advice given to you by your health care provider. Make sure you discuss any questions you have with your health care provider. Document Revised: 05/05/2021 Document Reviewed: 05/05/2021 Elsevier Patient Education  2024 [employer].

## 2023-11-01 ENCOUNTER — Encounter: Payer: Self-pay | Admitting: Internal Medicine

## 2023-11-06 ENCOUNTER — Other Ambulatory Visit: Payer: Self-pay | Admitting: Internal Medicine

## 2023-11-06 DIAGNOSIS — E785 Hyperlipidemia, unspecified: Secondary | ICD-10-CM

## 2023-11-06 DIAGNOSIS — I2510 Atherosclerotic heart disease of native coronary artery without angina pectoris: Secondary | ICD-10-CM

## 2023-11-30 ENCOUNTER — Encounter: Payer: Self-pay | Admitting: Neurology

## 2023-11-30 ENCOUNTER — Institutional Professional Consult (permissible substitution): Admitting: Neurology

## 2023-12-01 ENCOUNTER — Encounter: Payer: Self-pay | Admitting: Cardiology

## 2023-12-10 ENCOUNTER — Other Ambulatory Visit: Payer: Self-pay | Admitting: Internal Medicine

## 2023-12-10 DIAGNOSIS — I10 Essential (primary) hypertension: Secondary | ICD-10-CM

## 2023-12-10 DIAGNOSIS — I2510 Atherosclerotic heart disease of native coronary artery without angina pectoris: Secondary | ICD-10-CM

## 2023-12-11 ENCOUNTER — Other Ambulatory Visit: Payer: Self-pay | Admitting: Internal Medicine

## 2023-12-11 DIAGNOSIS — I10 Essential (primary) hypertension: Secondary | ICD-10-CM

## 2023-12-11 DIAGNOSIS — R600 Localized edema: Secondary | ICD-10-CM

## 2023-12-13 ENCOUNTER — Other Ambulatory Visit: Payer: Self-pay | Admitting: Internal Medicine

## 2023-12-13 DIAGNOSIS — I2510 Atherosclerotic heart disease of native coronary artery without angina pectoris: Secondary | ICD-10-CM

## 2023-12-13 DIAGNOSIS — E785 Hyperlipidemia, unspecified: Secondary | ICD-10-CM

## 2023-12-22 ENCOUNTER — Other Ambulatory Visit: Payer: Self-pay | Admitting: Internal Medicine

## 2023-12-22 DIAGNOSIS — M48062 Spinal stenosis, lumbar region with neurogenic claudication: Secondary | ICD-10-CM

## 2023-12-22 DIAGNOSIS — G4762 Sleep related leg cramps: Secondary | ICD-10-CM

## 2024-01-07 ENCOUNTER — Other Ambulatory Visit: Payer: Self-pay | Admitting: Internal Medicine

## 2024-01-07 DIAGNOSIS — M170 Bilateral primary osteoarthritis of knee: Secondary | ICD-10-CM

## 2024-01-07 DIAGNOSIS — M48062 Spinal stenosis, lumbar region with neurogenic claudication: Secondary | ICD-10-CM

## 2024-01-17 ENCOUNTER — Other Ambulatory Visit: Payer: Self-pay | Admitting: Internal Medicine

## 2024-01-17 DIAGNOSIS — M48062 Spinal stenosis, lumbar region with neurogenic claudication: Secondary | ICD-10-CM

## 2024-02-17 ENCOUNTER — Other Ambulatory Visit: Payer: Self-pay | Admitting: Internal Medicine

## 2024-02-17 DIAGNOSIS — M170 Bilateral primary osteoarthritis of knee: Secondary | ICD-10-CM

## 2024-02-17 DIAGNOSIS — M48062 Spinal stenosis, lumbar region with neurogenic claudication: Secondary | ICD-10-CM

## 2024-04-14 ENCOUNTER — Other Ambulatory Visit: Payer: Self-pay | Admitting: Internal Medicine

## 2024-04-14 DIAGNOSIS — R600 Localized edema: Secondary | ICD-10-CM

## 2024-04-14 DIAGNOSIS — I10 Essential (primary) hypertension: Secondary | ICD-10-CM

## 2024-04-18 ENCOUNTER — Other Ambulatory Visit: Payer: Self-pay | Admitting: Internal Medicine

## 2024-04-18 DIAGNOSIS — M170 Bilateral primary osteoarthritis of knee: Secondary | ICD-10-CM

## 2024-05-23 ENCOUNTER — Other Ambulatory Visit: Payer: Self-pay | Admitting: Internal Medicine

## 2024-05-23 DIAGNOSIS — M48062 Spinal stenosis, lumbar region with neurogenic claudication: Secondary | ICD-10-CM

## 2024-05-30 ENCOUNTER — Ambulatory Visit: Admitting: Internal Medicine

## 2024-06-14 ENCOUNTER — Other Ambulatory Visit (HOSPITAL_COMMUNITY): Payer: Self-pay

## 2024-07-05 ENCOUNTER — Other Ambulatory Visit: Payer: Self-pay | Admitting: Internal Medicine

## 2024-07-05 DIAGNOSIS — M48062 Spinal stenosis, lumbar region with neurogenic claudication: Secondary | ICD-10-CM

## 2024-07-05 DIAGNOSIS — M170 Bilateral primary osteoarthritis of knee: Secondary | ICD-10-CM

## 2024-07-15 ENCOUNTER — Other Ambulatory Visit: Payer: Self-pay | Admitting: Internal Medicine

## 2024-07-15 DIAGNOSIS — I10 Essential (primary) hypertension: Secondary | ICD-10-CM

## 2024-07-15 DIAGNOSIS — I2510 Atherosclerotic heart disease of native coronary artery without angina pectoris: Secondary | ICD-10-CM

## 2024-08-09 ENCOUNTER — Ambulatory Visit: Admitting: Internal Medicine

## 2024-08-09 ENCOUNTER — Other Ambulatory Visit: Payer: Self-pay | Admitting: Internal Medicine

## 2024-08-09 VITALS — BP 132/68 | HR 65 | Temp 98.2°F | Resp 16 | Ht 66.0 in | Wt 276.0 lb

## 2024-08-09 DIAGNOSIS — M48062 Spinal stenosis, lumbar region with neurogenic claudication: Secondary | ICD-10-CM | POA: Diagnosis not present

## 2024-08-09 DIAGNOSIS — Z23 Encounter for immunization: Secondary | ICD-10-CM

## 2024-08-09 DIAGNOSIS — E785 Hyperlipidemia, unspecified: Secondary | ICD-10-CM

## 2024-08-09 DIAGNOSIS — Z0000 Encounter for general adult medical examination without abnormal findings: Secondary | ICD-10-CM

## 2024-08-09 DIAGNOSIS — G4762 Sleep related leg cramps: Secondary | ICD-10-CM

## 2024-08-09 DIAGNOSIS — M5430 Sciatica, unspecified side: Secondary | ICD-10-CM | POA: Insufficient documentation

## 2024-08-09 DIAGNOSIS — R600 Localized edema: Secondary | ICD-10-CM

## 2024-08-09 DIAGNOSIS — Z1211 Encounter for screening for malignant neoplasm of colon: Secondary | ICD-10-CM

## 2024-08-09 DIAGNOSIS — Z1212 Encounter for screening for malignant neoplasm of rectum: Secondary | ICD-10-CM | POA: Insufficient documentation

## 2024-08-09 DIAGNOSIS — G4733 Obstructive sleep apnea (adult) (pediatric): Secondary | ICD-10-CM | POA: Diagnosis not present

## 2024-08-09 DIAGNOSIS — I2510 Atherosclerotic heart disease of native coronary artery without angina pectoris: Secondary | ICD-10-CM | POA: Diagnosis not present

## 2024-08-09 DIAGNOSIS — Z0001 Encounter for general adult medical examination with abnormal findings: Secondary | ICD-10-CM

## 2024-08-09 DIAGNOSIS — I10 Essential (primary) hypertension: Secondary | ICD-10-CM

## 2024-08-09 DIAGNOSIS — K625 Hemorrhage of anus and rectum: Secondary | ICD-10-CM | POA: Insufficient documentation

## 2024-08-09 DIAGNOSIS — M170 Bilateral primary osteoarthritis of knee: Secondary | ICD-10-CM | POA: Diagnosis not present

## 2024-08-09 DIAGNOSIS — E559 Vitamin D deficiency, unspecified: Secondary | ICD-10-CM | POA: Diagnosis not present

## 2024-08-09 DIAGNOSIS — Z800 Family history of malignant neoplasm of digestive organs: Secondary | ICD-10-CM

## 2024-08-09 MED ORDER — NEXLIZET 180-10 MG PO TABS: 1.0000 | ORAL_TABLET | Freq: Every day | ORAL | 0 refills | Status: AC

## 2024-08-09 NOTE — Progress Notes (Unsigned)
 "  Subjective:  Patient ID: Jacqueline Ferrell, female    DOB: 06/29/1966  Age: 59 y.o. MRN: [id number]  CC: Annual Exam (Medication refills also. Patient wants to discuss starting the Cymbalta  about 8 months ago and it has increased appetite. )   HPI Jacqueline Ferrell presents for a CPX and f/up ---  Discussed the use of AI scribe software for clinical note transcription with the patient, who gave verbal consent to proceed.  History of Present Illness Jacqueline Ferrell is a 59 year old female who presents with weight gain and increased appetite.  She has experienced significant weight gain and increased appetite, noting excessive eating, including snacks she had previously stopped consuming. She feels hungry all the time, even after meals. Her last recorded weight was over a month ago at 276 pounds, up from 267 pounds in April. She is concerned about her weight gain as it affects her ability to walk.  She is currently taking Tylenol , gabapentin , Cymbalta , and Celebrex  for pain and stiffness. She believes Celebrex  is effective but notes it may be affecting her emotional responses, as she was unable to cry after a personal loss in October. She is not experiencing sadness and is sleeping well, which is a change from her previous insomnia.  She mentions a history of sleep apnea but is not currently seeking treatment for it. She snores loudly and sleeps with her dog, who also snores.  She has not been engaging in physical activity recently.  No chest pain, shortness of breath, dizziness, lightheadedness, or bowel movement issues.     Outpatient Medications Prior to Visit  Medication Sig Dispense Refill   acetaminophen  (TYLENOL ) 500 MG tablet Take 1,000 mg by mouth every 12 (twelve) hours as needed for moderate pain.     aspirin  EC 81 MG tablet Take 1 tablet (81 mg total) by mouth daily. Swallow whole. 90 tablet 1   gabapentin  (NEURONTIN ) 300 MG capsule TAKE 1 CAPSULE BY MOUTH 4 TIMES DAILY. 120  capsule 2   hydrALAZINE  (APRESOLINE ) 25 MG tablet TAKE 1 TABLET BY MOUTH THREE TIMES A DAY 270 tablet 1   Magnesium  Oxide 250 MG TABS Take 1 tablet (250 mg total) by mouth at bedtime. 90 tablet 1   Multiple Vitamin (MULTIVITAMIN WITH MINERALS) TABS tablet Take 1 tablet by mouth daily.     tiZANidine  (ZANAFLEX ) 4 MG tablet TAKE 1 TABLET BY MOUTH EVERYDAY AT BEDTIME 90 tablet 1   triamterene -hydrochlorothiazide (DYAZIDE) 37.5-25 MG capsule TAKE 1 CAPSULE BY MOUTH DAILY. 21 capsule 4   atorvastatin  (LIPITOR) 80 MG tablet TAKE 1 TABLET BY MOUTH EVERY DAY 90 tablet 0   celecoxib  (CELEBREX ) 50 MG capsule TAKE 1 CAPSULE BY MOUTH TWICE A DAY 60 capsule 0   Cholecalciferol  50 MCG (2000 UT) TABS Take 1 tablet (2,000 Units total) by mouth daily. 90 tablet 1   DULoxetine  (CYMBALTA ) 60 MG capsule TAKE 1 CAPSULE BY MOUTH EVERY DAY 90 capsule 0   NEXLIZET  180-10 MG TABS TAKE 1 TABLET BY MOUTH EVERY DAY 90 tablet 0   No facility-administered medications prior to visit.    ROS Review of Systems  Constitutional:  Positive for unexpected weight change. Negative for appetite change, chills, diaphoresis, fatigue and fever.  HENT: Negative.  Negative for trouble swallowing.   Eyes: Negative.   Respiratory:  Positive for apnea. Negative for cough, chest tightness, shortness of breath and wheezing.   Cardiovascular:  Negative for chest pain, palpitations and leg swelling.  Gastrointestinal: Negative.  Negative for abdominal pain, constipation, diarrhea, nausea and rectal pain.  Endocrine: Negative.   Genitourinary: Negative.  Negative for difficulty urinating, dysuria and hematuria.  Musculoskeletal:  Positive for arthralgias, back pain and gait problem. Negative for myalgias and neck pain.  Neurological:  Negative for dizziness and weakness.  Hematological:  Negative for adenopathy. Does not bruise/bleed easily.    Objective:  BP 132/68 (BP Location: Left Arm, Patient Position: Sitting, Cuff Size: Large)    Pulse 65   Temp 98.2 F (36.8 C) (Oral)   Resp 16   Ht 5' 6 (1.676 m)   Wt 276 lb (125.2 kg) Comment: declined weight today  LMP 02/10/2008   SpO2 99%   BMI 44.55 kg/m   BP Readings from Last 3 Encounters:  08/09/24 132/68  10/31/23 (!) 144/84  10/11/23 122/82    Wt Readings from Last 3 Encounters:  08/09/24 276 lb (125.2 kg)  10/31/23 267 lb (121.1 kg)  10/11/23 267 lb 3.2 oz (121.2 kg)    Physical Exam Vitals reviewed.  Constitutional:      Appearance: Normal appearance.  HENT:     Mouth/Throat:     Mouth: Mucous membranes are moist.  Eyes:     General: No scleral icterus.    Conjunctiva/sclera: Conjunctivae normal.  Cardiovascular:     Rate and Rhythm: Normal rate and regular rhythm.     Heart sounds: No murmur heard.    No friction rub. No gallop.     Comments: EKG---- NSR, 61 bpm No LVH, Q waves, or ST/T wave changes  Pulmonary:     Effort: Pulmonary effort is normal.     Breath sounds: No stridor. No wheezing, rhonchi or rales.  Abdominal:     General: Abdomen is flat. Bowel sounds are normal.     Palpations: There is no mass.     Tenderness: There is no abdominal tenderness. There is no guarding.     Hernia: No hernia is present.  Musculoskeletal:        General: Deformity (djd) present. No swelling or tenderness.     Cervical back: Neck supple.     Right lower leg: No edema.     Left lower leg: No edema.  Lymphadenopathy:     Cervical: No cervical adenopathy.  Skin:    General: Skin is warm and dry.  Neurological:     General: No focal deficit present.     Mental Status: She is alert.  Psychiatric:        Mood and Affect: Mood normal.        Behavior: Behavior normal.     Lab Results  Component Value Date   WBC 5.9 08/09/2024   HGB 13.3 08/09/2024   HCT 40.5 08/09/2024   PLT 246 08/09/2024   GLUCOSE 79 08/09/2024   CHOL 213 (H) 08/09/2024   TRIG 91 08/09/2024   HDL 44 08/09/2024   LDLCALC 153 (H) 08/09/2024   ALT 15 08/09/2024    AST 16 08/09/2024   NA 139 08/09/2024   K 4.4 08/09/2024   CL 101 08/09/2024   CREATININE 0.80 08/09/2024   BUN 16 08/09/2024   CO2 26 08/09/2024   TSH 0.835 08/09/2024   INR 1.1 (H) 09/11/2010   HGBA1C 5.6 08/09/2024    MM 3D SCREENING MAMMOGRAM BILATERAL BREAST Result Date: 08/23/2023 CLINICAL DATA:  Screening. EXAM: DIGITAL SCREENING BILATERAL MAMMOGRAM WITH TOMOSYNTHESIS AND CAD TECHNIQUE: Bilateral screening digital craniocaudal and mediolateral oblique mammograms were obtained. Bilateral screening digital breast  tomosynthesis was performed. The images were evaluated with computer-aided detection. COMPARISON:  Previous exam(s). ACR Breast Density Category c: The breasts are heterogeneously dense, which may obscure small masses. FINDINGS: There are no findings suspicious for malignancy. IMPRESSION: No mammographic evidence of malignancy. A result letter of this screening mammogram will be mailed directly to the patient. RECOMMENDATION: Screening mammogram in one year. (Code:SM-B-01Y) BI-RADS CATEGORY  1: Negative. Electronically Signed   By: Rosaline Collet M.D.   On: 08/23/2023 15:18    Assessment & Plan:  OSA (obstructive sleep apnea) -     Zepbound ; Inject 2.5 mg into the skin once a week.  Dispense: 4 mL; Refill: 0 -     Pulmonary Visit  Hyperlipidemia with target LDL less than 70 -     Nexlizet ; Take 1 tablet by mouth daily.  Dispense: 90 tablet; Refill: 0 -     Lipid panel; Future -     Hepatic function panel; Future -     CK; Future -     Atorvastatin  Calcium ; Take 1 tablet (80 mg total) by mouth daily.  Dispense: 90 tablet; Refill: 0 -     AMB Referral VBCI Care Management  Atherosclerosis of native coronary artery of native heart without angina pectoris -     Nexlizet ; Take 1 tablet by mouth daily.  Dispense: 90 tablet; Refill: 0 -     EKG 12-Lead -     Atorvastatin  Calcium ; Take 1 tablet (80 mg total) by mouth daily.  Dispense: 90 tablet; Refill: 0  Primary  hypertension -     EKG 12-Lead -     Urinalysis, Routine w reflex microscopic; Future -     Basic metabolic panel with GFR; Future -     CBC with Differential/Platelet; Future  Need for immunization against influenza -     Flu vaccine trivalent PF, 6mos and older(Flulaval,Afluria,Fluarix,Fluzone)  Family history of colon cancer -     Ambulatory referral to Gastroenterology  Screen for colon cancer -     Ambulatory referral to Gastroenterology  Morbid obesity (HCC) -     TSH; Future -     Hemoglobin A1c; Future  Nocturnal leg cramps -     Magnesium ; Future -     Basic metabolic panel with GFR; Future -     CK; Future  Vitamin D  deficiency disease -     VITAMIN D  25 Hydroxy (Vit-D Deficiency, Fractures); Future -     Cholecalciferol ; Take 1 capsule (50,000 Units total) by mouth once a week.  Dispense: 12 capsule; Refill: 0  Primary osteoarthritis of both knees -     DULoxetine  HCl; Take 1 capsule (60 mg total) by mouth daily.  Dispense: 90 capsule; Refill: 1 -     Celecoxib ; Take 1 capsule (50 mg total) by mouth 2 (two) times daily.  Dispense: 180 capsule; Refill: 0  Spinal stenosis, lumbar region, with neurogenic claudication -     Celecoxib ; Take 1 capsule (50 mg total) by mouth 2 (two) times daily.  Dispense: 180 capsule; Refill: 0     Follow-up: Return in about 6 months (around 02/06/2025).  Debby Molt, MD "

## 2024-08-09 NOTE — Patient Instructions (Signed)

## 2024-08-10 ENCOUNTER — Other Ambulatory Visit: Payer: Self-pay | Admitting: Internal Medicine

## 2024-08-10 ENCOUNTER — Ambulatory Visit: Payer: Self-pay | Admitting: Internal Medicine

## 2024-08-10 DIAGNOSIS — G4733 Obstructive sleep apnea (adult) (pediatric): Secondary | ICD-10-CM

## 2024-08-10 LAB — BASIC METABOLIC PANEL WITH GFR
BUN/Creatinine Ratio: 20 (ref 9–23)
BUN: 16 mg/dL (ref 6–24)
CO2: 26 mmol/L (ref 20–29)
Calcium: 9.5 mg/dL (ref 8.7–10.2)
Chloride: 101 mmol/L (ref 96–106)
Creatinine, Ser: 0.8 mg/dL (ref 0.57–1.00)
Glucose: 79 mg/dL (ref 70–99)
Potassium: 4.4 mmol/L (ref 3.5–5.2)
Sodium: 139 mmol/L (ref 134–144)
eGFR: 86 mL/min/{1.73_m2}

## 2024-08-10 LAB — LIPID PANEL
Chol/HDL Ratio: 4.8 ratio — ABNORMAL HIGH (ref 0.0–4.4)
Cholesterol, Total: 213 mg/dL — ABNORMAL HIGH (ref 100–199)
HDL: 44 mg/dL
LDL Chol Calc (NIH): 153 mg/dL — ABNORMAL HIGH (ref 0–99)
Triglycerides: 91 mg/dL (ref 0–149)
VLDL Cholesterol Cal: 16 mg/dL (ref 5–40)

## 2024-08-10 LAB — HEMOGLOBIN A1C
Est. average glucose Bld gHb Est-mCnc: 114 mg/dL
Hgb A1c MFr Bld: 5.6 % (ref 4.8–5.6)

## 2024-08-10 LAB — CBC WITH DIFFERENTIAL/PLATELET
Basophils Absolute: 0 10*3/uL (ref 0.0–0.2)
Basos: 1 %
EOS (ABSOLUTE): 0.1 10*3/uL (ref 0.0–0.4)
Eos: 2 %
Hematocrit: 40.5 % (ref 34.0–46.6)
Hemoglobin: 13.3 g/dL (ref 11.1–15.9)
Immature Grans (Abs): 0 10*3/uL (ref 0.0–0.1)
Immature Granulocytes: 0 %
Lymphocytes Absolute: 2.1 10*3/uL (ref 0.7–3.1)
Lymphs: 36 %
MCH: 31 pg (ref 26.6–33.0)
MCHC: 32.8 g/dL (ref 31.5–35.7)
MCV: 94 fL (ref 79–97)
Monocytes Absolute: 0.5 10*3/uL (ref 0.1–0.9)
Monocytes: 8 %
Neutrophils Absolute: 3.2 10*3/uL (ref 1.4–7.0)
Neutrophils: 53 %
Platelets: 246 10*3/uL (ref 150–450)
RBC: 4.29 x10E6/uL (ref 3.77–5.28)
RDW: 12.8 % (ref 11.7–15.4)
WBC: 5.9 10*3/uL (ref 3.4–10.8)

## 2024-08-10 LAB — HEPATIC FUNCTION PANEL
ALT: 15 [IU]/L (ref 0–32)
AST: 16 [IU]/L (ref 0–40)
Albumin: 4.2 g/dL (ref 3.8–4.9)
Alkaline Phosphatase: 87 [IU]/L (ref 49–135)
Bilirubin Total: 0.5 mg/dL (ref 0.0–1.2)
Bilirubin, Direct: 0.18 mg/dL (ref 0.00–0.40)
Total Protein: 6.6 g/dL (ref 6.0–8.5)

## 2024-08-10 LAB — URINALYSIS, ROUTINE W REFLEX MICROSCOPIC
Bilirubin, UA: NEGATIVE
Glucose, UA: NEGATIVE
Leukocytes,UA: NEGATIVE
Nitrite, UA: NEGATIVE
Protein,UA: NEGATIVE
RBC, UA: NEGATIVE
Urobilinogen, Ur: 0.2 mg/dL (ref 0.2–1.0)
pH, UA: 5 (ref 5.0–7.5)

## 2024-08-10 LAB — VITAMIN D 25 HYDROXY (VIT D DEFICIENCY, FRACTURES): Vit D, 25-Hydroxy: 11.7 ng/mL — ABNORMAL LOW (ref 30.0–100.0)

## 2024-08-10 LAB — CK: Total CK: 215 U/L — ABNORMAL HIGH (ref 32–182)

## 2024-08-10 LAB — TSH: TSH: 0.835 u[IU]/mL (ref 0.450–4.500)

## 2024-08-10 LAB — MAGNESIUM: Magnesium: 2 mg/dL (ref 1.6–2.3)

## 2024-08-10 MED ORDER — CHOLECALCIFEROL 1.25 MG (50000 UT) PO CAPS: 50000.0000 [IU] | ORAL_CAPSULE | ORAL | 0 refills | Status: AC

## 2024-08-10 MED ORDER — ZEPBOUND 2.5 MG/0.5ML ~~LOC~~ SOAJ: 2.5000 mg | SUBCUTANEOUS | 0 refills | Status: DC

## 2024-08-10 MED ORDER — ATORVASTATIN CALCIUM 80 MG PO TABS: 80.0000 mg | ORAL_TABLET | Freq: Every day | ORAL | 0 refills | Status: AC

## 2024-08-10 MED ORDER — CELECOXIB 50 MG PO CAPS: 50.0000 mg | ORAL_CAPSULE | Freq: Two times a day (BID) | ORAL | 0 refills | Status: AC

## 2024-08-10 MED ORDER — DULOXETINE HCL 60 MG PO CPEP: 60.0000 mg | ORAL_CAPSULE | Freq: Every day | ORAL | 1 refills | Status: AC

## 2024-08-12 MED ORDER — TRIAMTERENE-HCTZ 37.5-25 MG PO CAPS: 1.0000 | ORAL_CAPSULE | Freq: Every day | ORAL | 0 refills | Status: AC

## 2024-08-13 ENCOUNTER — Encounter: Payer: Self-pay | Admitting: Internal Medicine

## 2024-08-13 ENCOUNTER — Telehealth: Payer: Self-pay

## 2024-08-13 DIAGNOSIS — Z0001 Encounter for general adult medical examination with abnormal findings: Secondary | ICD-10-CM | POA: Insufficient documentation

## 2024-08-13 NOTE — Progress Notes (Signed)
 Care Guide Pharmacy Note  08/13/2024 Name: Jacqueline Ferrell MRN: [id number] DOB: 08/23/1965  Referred By: Joshua Debby CROME, MD Reason for referral: Call Attempt #1 and Complex Care Management (Outreach to sch ref w/ pharm)   Jacqueline Ferrell is a 59 y.o. year old female who is a primary care patient of Joshua Debby CROME, MD.  Jacqueline Ferrell was referred to the pharmacist for assistance related to: HLD  Successful contact was made with the patient to discuss pharmacy services including being ready for the pharmacist to call at least 5 minutes before the scheduled appointment time and to have medication bottles and any blood pressure readings ready for review. The patient agreed to meet with the pharmacist via telephone visit on 08/27/2024.  Doyce Razor [facility], [facility] Guide Direct Dial: [phone]  Fax: [phone]

## 2024-08-15 ENCOUNTER — Telehealth: Payer: Self-pay

## 2024-08-15 ENCOUNTER — Other Ambulatory Visit (HOSPITAL_COMMUNITY): Payer: Self-pay

## 2024-08-15 ENCOUNTER — Telehealth: Payer: Self-pay | Admitting: Pharmacist

## 2024-08-15 DIAGNOSIS — I2510 Atherosclerotic heart disease of native coronary artery without angina pectoris: Secondary | ICD-10-CM

## 2024-08-15 MED ORDER — WEGOVY 0.25 MG/0.5ML ~~LOC~~ SOAJ: 0.2500 mg | SUBCUTANEOUS | 0 refills | Status: AC

## 2024-08-15 NOTE — Telephone Encounter (Signed)
 Patient has been made aware and gave a verbal understanding.

## 2024-08-15 NOTE — Progress Notes (Unsigned)
 Provider asked for assistance w/ patient message stating she is needing PA for Nexlizt and also that her insurance does not cover Zepbound  but they do Wegovy . Have sent Rx for Wegovy  and sending message to PA team to complete PAs for both meds.  Darrelyn Drum, PharmD, BCPS, CPP Clinical Pharmacist Practitioner [facility] at [facility] [phone]

## 2024-08-15 NOTE — Telephone Encounter (Signed)
 Pharmacy Patient Advocate Encounter   Received notification from RX Request Messages that prior authorization for Zepbound  2.5mg /0.75ml is required/requested.   Insurance verification completed.   The patient is insured through [facility].   Per test claim: Per test claim, medication is not covered due to plan/benefit exclusion, PA not submitted at this time  Zepbound  for weight loss is excluded. If the patient has OSA a sleep study is needed for documentation of moderate to severe OSA.

## 2024-08-16 ENCOUNTER — Other Ambulatory Visit (HOSPITAL_COMMUNITY): Payer: Self-pay

## 2024-08-16 ENCOUNTER — Telehealth: Payer: Self-pay

## 2024-08-16 NOTE — Telephone Encounter (Signed)
 Unable to reach patient. Left a very detailed message about her medication denial.

## 2024-08-16 NOTE — Telephone Encounter (Signed)
 Pharmacy Patient Advocate Encounter   Received notification from Physician's Office that prior authorization for Wegovy  0.25 mg/0.5 ml pen injectors is required/requested.   Insurance verification completed.   The patient is insured through [employer].   Per test claim: Per test claim, medication is not covered due to plan/benefit exclusion, PA not submitted at this time

## 2024-08-16 NOTE — Telephone Encounter (Signed)
 Weight loss meds are not covered under her plan. Please see telephone encounters for the Wegovy  and Zepbound .

## 2024-08-16 NOTE — Telephone Encounter (Signed)
 Pharmacy Patient Advocate Encounter   Received notification from Pt Calls Messages that prior authorization for Nexlizet  180-10MG  tablets  is required/requested.   Insurance verification completed.   The patient is insured through [facility].   Per test claim: PA required; PA submitted to above mentioned insurance via Latent Key/confirmation #/EOC [id number] Status is pending

## 2024-08-27 ENCOUNTER — Other Ambulatory Visit

## 2024-09-06 ENCOUNTER — Ambulatory Visit: Admitting: Primary Care
